# Patient Record
Sex: Female | Born: 1950 | Race: White | Hispanic: No | Smoking: Former smoker
Health system: Southern US, Community
[De-identification: ages and names within clinical notes are randomized; demographics above are authoritative.]

## PROBLEM LIST (undated history)

## (undated) DIAGNOSIS — E669 Obesity, unspecified: Secondary | ICD-10-CM

## (undated) DIAGNOSIS — K219 Gastro-esophageal reflux disease without esophagitis: Secondary | ICD-10-CM

## (undated) DIAGNOSIS — M797 Fibromyalgia: Secondary | ICD-10-CM

## (undated) DIAGNOSIS — L309 Dermatitis, unspecified: Secondary | ICD-10-CM

## (undated) DIAGNOSIS — I219 Acute myocardial infarction, unspecified: Secondary | ICD-10-CM

## (undated) DIAGNOSIS — I2510 Atherosclerotic heart disease of native coronary artery without angina pectoris: Secondary | ICD-10-CM

## (undated) DIAGNOSIS — F419 Anxiety disorder, unspecified: Secondary | ICD-10-CM

## (undated) DIAGNOSIS — Z8719 Personal history of other diseases of the digestive system: Secondary | ICD-10-CM

## (undated) DIAGNOSIS — K5790 Diverticulosis of intestine, part unspecified, without perforation or abscess without bleeding: Secondary | ICD-10-CM

## (undated) DIAGNOSIS — I201 Angina pectoris with documented spasm: Secondary | ICD-10-CM

## (undated) DIAGNOSIS — I10 Essential (primary) hypertension: Secondary | ICD-10-CM

## (undated) DIAGNOSIS — E7800 Pure hypercholesterolemia, unspecified: Secondary | ICD-10-CM

## (undated) DIAGNOSIS — R918 Other nonspecific abnormal finding of lung field: Secondary | ICD-10-CM

## (undated) DIAGNOSIS — K279 Peptic ulcer, site unspecified, unspecified as acute or chronic, without hemorrhage or perforation: Secondary | ICD-10-CM

## (undated) DIAGNOSIS — M1990 Unspecified osteoarthritis, unspecified site: Secondary | ICD-10-CM

## (undated) DIAGNOSIS — G8929 Other chronic pain: Secondary | ICD-10-CM

## (undated) DIAGNOSIS — M549 Dorsalgia, unspecified: Secondary | ICD-10-CM

## (undated) DIAGNOSIS — K2950 Unspecified chronic gastritis without bleeding: Secondary | ICD-10-CM

## (undated) HISTORY — DX: Angina pectoris with documented spasm: I20.1

## (undated) HISTORY — PX: INGUINAL HERNIA REPAIR: SUR1180

## (undated) HISTORY — DX: Essential (primary) hypertension: I10

## (undated) HISTORY — DX: Diverticulosis of intestine, part unspecified, without perforation or abscess without bleeding: K57.90

## (undated) HISTORY — DX: Dermatitis, unspecified: L30.9

## (undated) HISTORY — DX: Obesity, unspecified: E66.9

## (undated) HISTORY — DX: Other nonspecific abnormal finding of lung field: R91.8

## (undated) HISTORY — DX: Unspecified chronic gastritis without bleeding: K29.50

## (undated) HISTORY — PX: LAPAROSCOPIC CHOLECYSTECTOMY: SUR755

## (undated) HISTORY — DX: Peptic ulcer, site unspecified, unspecified as acute or chronic, without hemorrhage or perforation: K27.9

## (undated) HISTORY — PX: CYSTECTOMY: SUR359

## (undated) HISTORY — DX: Atherosclerotic heart disease of native coronary artery without angina pectoris: I25.10

---

## 1977-10-08 HISTORY — PX: TUBAL LIGATION: SHX77

## 1988-10-08 HISTORY — PX: VAGINAL HYSTERECTOMY: SUR661

## 2005-10-23 ENCOUNTER — Ambulatory Visit: Payer: Self-pay | Admitting: Internal Medicine

## 2005-10-26 ENCOUNTER — Encounter: Payer: Self-pay | Admitting: Internal Medicine

## 2005-10-26 ENCOUNTER — Ambulatory Visit (HOSPITAL_COMMUNITY): Admission: RE | Admit: 2005-10-26 | Discharge: 2005-10-26 | Payer: Self-pay | Admitting: Internal Medicine

## 2005-10-26 ENCOUNTER — Ambulatory Visit: Payer: Self-pay | Admitting: Internal Medicine

## 2007-04-03 ENCOUNTER — Ambulatory Visit: Payer: Self-pay | Admitting: Cardiology

## 2007-04-14 ENCOUNTER — Ambulatory Visit: Payer: Self-pay | Admitting: Cardiology

## 2009-05-04 ENCOUNTER — Ambulatory Visit: Payer: Self-pay | Admitting: Cardiology

## 2010-06-27 ENCOUNTER — Ambulatory Visit: Payer: Self-pay | Admitting: Cardiology

## 2010-08-11 ENCOUNTER — Ambulatory Visit (HOSPITAL_COMMUNITY): Admission: RE | Admit: 2010-08-11 | Discharge: 2010-08-11 | Payer: Self-pay | Admitting: Cardiology

## 2010-08-11 ENCOUNTER — Ambulatory Visit: Payer: Self-pay | Admitting: Cardiology

## 2010-08-11 ENCOUNTER — Encounter: Payer: Self-pay | Admitting: Cardiology

## 2010-08-11 ENCOUNTER — Ambulatory Visit: Payer: Self-pay

## 2010-08-14 ENCOUNTER — Encounter: Payer: Self-pay | Admitting: Cardiology

## 2011-02-23 NOTE — Op Note (Signed)
Jenna Hernandez, Jenna Hernandez              ACCOUNT NO.:  [id number]   MEDICAL RECORD NO.:  [id number]          PATIENT TYPE:  AMB   LOCATION:  DAY                           FACILITY:  [facility]   PHYSICIAN:  R. Roetta Sessions, M.D. DATE OF BIRTH:  Feb 25, 1951   DATE OF PROCEDURE:  10/26/2005  DATE OF DISCHARGE:                                 OPERATIVE REPORT   PROCEDURE:  Esophagogastroduodenoscopy with esophageal biopsy.   INDICATIONS FOR PROCEDURE:  The patient is a 60 year old lady with a three-  month history of odynophagia. Jenna Hernandez describes her discomfort as  swallowing a piece of ice. Jenna Hernandez feels things rub behind her breastbone. Jenna Hernandez  has had some background symptoms in the past, typically ingestion and  reflux. These symptoms have been pretty well squelched with Nexium 40 mg  orally b.i.d. which was started by Dr. Neita Carp approximately three months  ago. However, this has not helped the above mentioned symptoms. Jenna Hernandez may have  odynophagia symptoms without swallowing, but most of the time, it is with  swallowing, not worse with pills or food. Has not had any nausea or  vomiting. No GI bleeding.   Jenna Hernandez is not taking any NSAIDs aside from one baby aspirin daily.   Jenna Hernandez is taking Valtrex, not on our original medication list. EGD is now being  done. This approach has been discussed with the patient at length. Potential  risks, benefits, and alternatives have been reviewed and questions answered.  Jenna Hernandez is agreeable. Please see documentation in the medical record.   PROCEDURE NOTE:  O2 saturation, blood pressure, pulse, and respirations were  monitored throughout the entire procedure. Conscious sedation with Versed 5  mg IV, fentanyl 125 mcg IV. Cetacaine spray for topical oropharyngeal  anesthesia.   INSTRUMENT:  Olympus video chip system.   FINDINGS:  Examination of the hypopharynx revealed what appeared to be some  edema around the vocal cords. No obvious polyp or other lesion seen. The  cricopharyngeus was easily intubated. 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 There was also one yellowish flat lesion in the  proximal esophagus, but certainly there was no evidence of candida  esophagus, erosion or ulceration. There was no Barrett's esophagus. No sign  of neoplasia. EG junction appeared normal and was easily traversed. The EG  junction was identified at 36 cm from the incisors with the stomach inflated  and 37 cm deflated.   Stomach:  Gastric cavity insufflated well with air. It was empty. Thorough  examination of gastric mucosa including retroflexed view of the proximal  stomach and esophagogastric junction demonstrated no abnormalities. Pylorus  patent and easily traversed. Examination of bulb and second portion revealed  no abnormalities.   THERAPEUTIC/DIAGNOSTIC MANEUVERS:  The random mucosal biopsies of the mid  distal esophagus were taken to rule out microscopic esophagus/eosinophilic  esophagus. One of the pale nodules in the distal esophagus was biopsied  separately, and finally, a third separate biopsy was taken for pathology,  specifically the small yellowish 4 to 5 mm lesion  in the more proximal  esophagus. The patient tolerated the procedure well and was reactive to  endoscopy.   IMPRESSION:  1.  Nonspecific nodules in the esophagus, may or may not be clinically      significant (lesion consistent with squamous papilloma would not be      significant). No obvious esophagitis. Status post biopsies as described      above.  2.  Normal stomach. Normal D1 and D2.   I do not feel at this time that pH monitoring would add anything at all to  the clinical scenario as Jenna Hernandez does have some typical reflux symptoms that  have been pretty well squelched with b.i.d. PPI therapy. Jenna Hernandez does have some  subtle edema and swelling around her vocal cords which may or may not  be  significant.   RECOMMENDATIONS:  1.  Continue Nexium 40 mg orally b.i.d.  2.  Add Carafate 1 g slurries q.i.d. p.r.n. chest discomfort.  3.  Follow up on biopsies.  4.  Further recommendations to follow.      Jonathon Bellows, M.D.  Electronically Signed     RMR/MEDQ  D:  10/26/2005  T:  10/26/2005  Job:  [id number]   cc:   Fara Chute  Fax: [phone]

## 2011-02-23 NOTE — Consult Note (Signed)
Jenna Hernandez, Jenna Hernandez              ACCOUNT NO.:  [id number]   MEDICAL RECORD NO.:  [id number]          PATIENT TYPE:  AMB   LOCATION:                                FACILITY:  [facility]   PHYSICIAN:  Jenna Hernandez, M.D.    DATE OF BIRTH:  [birth year]   DATE OF CONSULTATION:  DATE OF DISCHARGE:                                   CONSULTATION   REASON FOR CONSULTATION:  Odynophagia.   HISTORY OF PRESENT ILLNESS:  Jenna Hernandez is a 60 year old Caucasian female  who notes over the last three months she has had intermittent odynophagia.  She states it feels as if a piece of ice is running along her esophagus  when she swallows.  She has problems when swallowing both liquids and  solids.  She denies any dysphagia, however.  Most of her food bolus' are  passing freely.  She denies any regurgitation, nausea or vomiting.  She was  placed on Nexium 40 mg b.i.d. within the last couple of months.  She notices  slight improvement of her symptoms, but not completely.  She denies any  heartburn or indigestion.  The pain is not necessarily associated with  eating, nor does food make it worse.  She rates the pain 9/10 on pain scale.  Mylanta does seem to ease the pain somewhat.  She was treated with Septra  for UTI about two weeks ago.  However, this was after the onset of her  symptoms.   She has a history of IBS, currently on Zelnorm for constipation predominant  symptoms.  She generally has between 1-3 bowel movements a day while on this  medication.  She denies any rectal bleeding or melena.   PAST MEDICAL HISTORY:  1.  Fibromyalgia.  2.  Irritable bowel syndrome.  3.  She had a colonoscopy in January 2006 which was normal but Dr.      Cleotis Nipper.  4.  EGD in 2005 which reportedly was normal by Dr. Cleotis Nipper, per the      patient.  5.  Cholecystectomy in 2003.  6.  Hysterectomy in 1990.  7.  Femoral hernia repair in 1982.  8.  Previously she had an EGD on September 01, 2002, by Dr. Karilyn Cota which  was      normal.  She had a CLO test and I do not have results of this.   CURRENT MEDICATIONS:  1.  Aspirin 81 mg daily.  2.  Nexium 40 mg b.i.d.  3.  Zelnorm 6 mg b.i.d.  4.  Xanax 0.25 mg q.h.s.   ALLERGIES:  CODEINE AND IV MEPERGAN.   FAMILY HISTORY:  Positive for her mother who was diagnosed with colon cancer  in her 47's.  She also has history of coronary artery disease and  osteoarthritis.  She is alive and doing well status post colon surgery.  Father, age 51, has history of MI and diabetes mellitus.  She has one sister  with diabetes mellitus.  Two healthy brothers.   SOCIAL HISTORY:  [name] is divorced.  She lives with her parents.  She  has  three grown healthy children.  She is employed full time with [employer].  She has a 10-year history of smoking about half pack a day.  Denies any alcohol or drug use.   REVIEW OF SYSTEMS:  CONSTITUTIONAL:  She is decreasing her caloric intake,  trying to loose weight.  She has lost about 10 pounds a year over the last  three years.  Denies any fever or chills.  Denies any anorexia or early  satiety.  CARDIOVASCULAR:  She did have some atypical type chest pain a  couple of months ago.  Was seen in [facility] by Dr. Reyes Ivan.  She had a  cardiac workup which sounds like it consisted of a stress Myoview and  echocardiogram, both of which Jenna Hernandez tells me were negative.  PULMONARY:  Denies any cough, shortness of breath, dyspnea or hemoptysis.  GI:  See HPI.   PHYSICAL EXAMINATION:  VITAL SIGNS:  Weight 150 pounds, height 63-1/2  inches, temperature 97.9, blood pressure 120/74, pulse 68.  GENERAL APPEARANCE:  Jenna Hernandez is 60 year old Caucasian female with a  three month history of severe intermittent odynophagia along with mild  epigastric pain.  She denies any heartburn or indigestion or regurgitation.  She denies any dysphagia either.  I suspect she may have erosive reflux  esophagitis or even candida esophagitis  given her symptoms.  She is going to  need further evaluation.  She has history of chronic GERD symptoms and  dyspepsia.  She also has history of consultation predominant IBS.   PLAN:  1.  Will schedule EGD plus or minus BRAVO with Dr. Karilyn Cota in the near      future.  2.  She can continue Nexium 40 mg b.i.d. #60 with one refill.  3.  Further recommendations pending study.   I would like to thank Dr. Neita Carp for allowing [place] to participate in the care  of Jenna Hernandez.      [name], N.P.      Jenna Hernandez, M.D.  Electronically Signed    KC/MEDQ  D:  10/23/2005  T:  10/23/2005  Job:  [id number]   cc:   Fara Chute  Fax: [phone]

## 2011-12-07 HISTORY — PX: CARDIAC CATHETERIZATION: SHX172

## 2011-12-13 ENCOUNTER — Encounter: Payer: Self-pay | Admitting: Internal Medicine

## 2011-12-13 ENCOUNTER — Other Ambulatory Visit: Payer: Self-pay

## 2011-12-13 ENCOUNTER — Encounter (HOSPITAL_COMMUNITY): Payer: Self-pay | Admitting: Internal Medicine

## 2011-12-13 ENCOUNTER — Inpatient Hospital Stay (HOSPITAL_COMMUNITY)
Admission: AD | Admit: 2011-12-13 | Discharge: 2011-12-14 | DRG: 282 | Disposition: A | Discharge status: Home / Self Care | Payer: PRIVATE HEALTH INSURANCE | Source: Other Acute Inpatient Hospital | Attending: Internal Medicine | Admitting: Internal Medicine

## 2011-12-13 ENCOUNTER — Encounter (HOSPITAL_COMMUNITY)
Admission: AD | Disposition: A | Discharge status: Home / Self Care | Payer: Self-pay | Source: Other Acute Inpatient Hospital | Attending: Internal Medicine

## 2011-12-13 DIAGNOSIS — K219 Gastro-esophageal reflux disease without esophagitis: Secondary | ICD-10-CM | POA: Diagnosis present

## 2011-12-13 DIAGNOSIS — I214 Non-ST elevation (NSTEMI) myocardial infarction: Principal | ICD-10-CM | POA: Diagnosis present

## 2011-12-13 DIAGNOSIS — F17200 Nicotine dependence, unspecified, uncomplicated: Secondary | ICD-10-CM | POA: Diagnosis present

## 2011-12-13 DIAGNOSIS — R079 Chest pain, unspecified: Secondary | ICD-10-CM

## 2011-12-13 DIAGNOSIS — IMO0001 Reserved for inherently not codable concepts without codable children: Secondary | ICD-10-CM | POA: Diagnosis present

## 2011-12-13 HISTORY — PX: LEFT HEART CATHETERIZATION WITH CORONARY ANGIOGRAM: SHX5451

## 2011-12-13 HISTORY — DX: Fibromyalgia: M79.7

## 2011-12-13 HISTORY — DX: Gastro-esophageal reflux disease without esophagitis: K21.9

## 2011-12-13 LAB — PROTIME-INR: Prothrombin Time: 13.7 seconds (ref 11.6–15.2)

## 2011-12-13 LAB — DIFFERENTIAL
Eosinophils Absolute: 0.1 10*3/uL (ref 0.0–0.7)
Eosinophils Relative: 1 % (ref 0–5)
Lymphocytes Relative: 29 % (ref 12–46)
Lymphs Abs: 2 10*3/uL (ref 0.7–4.0)
Monocytes Absolute: 0.6 10*3/uL (ref 0.1–1.0)
Monocytes Relative: 9 % (ref 3–12)

## 2011-12-13 LAB — LIPID PANEL
LDL Cholesterol: 74 mg/dL (ref 0–99)
Triglycerides: 116 mg/dL (ref <150)

## 2011-12-13 LAB — CARDIAC PANEL(CRET KIN+CKTOT+MB+TROPI)
CK, MB: 36.7 ng/mL (ref 0.3–4.0)
CK, MB: 26.5 ng/mL (ref 0.3–4.0)
CK, MB: 20.3 ng/mL (ref 0.3–4.0)
Relative Index: 8 — ABNORMAL HIGH (ref 0.0–2.5)
Total CK: 349 U/L — ABNORMAL HIGH (ref 7–177)
Total CK: 333 U/L — ABNORMAL HIGH (ref 7–177)
Total CK: 317 U/L — ABNORMAL HIGH (ref 7–177)

## 2011-12-13 LAB — CBC
HCT: 36.6 % (ref 36.0–46.0)
MCH: 30.7 pg (ref 26.0–34.0)
MCV: 91.3 fL (ref 78.0–100.0)
RBC: 4.01 MIL/uL (ref 3.87–5.11)
WBC: 6.9 10*3/uL (ref 4.0–10.5)

## 2011-12-13 LAB — COMPREHENSIVE METABOLIC PANEL
ALT: 13 U/L (ref 0–35)
BUN: 13 mg/dL (ref 6–23)
CO2: 32 mEq/L (ref 19–32)
Calcium: 9.7 mg/dL (ref 8.4–10.5)
Creatinine, Ser: 0.82 mg/dL (ref 0.50–1.10)
GFR calc Af Amer: 88 mL/min — ABNORMAL LOW (ref >90)
GFR calc non Af Amer: 76 mL/min — ABNORMAL LOW (ref >90)
Glucose, Bld: 96 mg/dL (ref 70–99)
Total Protein: 6.5 g/dL (ref 6.0–8.3)

## 2011-12-13 LAB — TSH: TSH: 3.714 u[IU]/mL (ref 0.350–4.500)

## 2011-12-13 LAB — POCT ACTIVATED CLOTTING TIME: Activated Clotting Time: 177 seconds

## 2011-12-13 SURGERY — LEFT HEART CATHETERIZATION WITH CORONARY ANGIOGRAM
Anesthesia: LOCAL

## 2011-12-13 MED ORDER — METOPROLOL TARTRATE 12.5 MG HALF TABLET
12.5000 mg | ORAL_TABLET | Freq: Two times a day (BID) | ORAL | Status: DC
  Administered 2011-12-13 – 2011-12-14 (×2): 12.5 mg via ORAL
  Filled 2011-12-13 (×6): qty 1

## 2011-12-13 MED ORDER — ASPIRIN EC 81 MG PO TBEC
81.0000 mg | DELAYED_RELEASE_TABLET | Freq: Every day | ORAL | Status: DC
  Administered 2011-12-14: 81 mg via ORAL
  Filled 2011-12-13: qty 1

## 2011-12-13 MED ORDER — NITROGLYCERIN 0.2 MG/ML ON CALL CATH LAB
INTRAVENOUS | Status: AC
  Filled 2011-12-13: qty 1

## 2011-12-13 MED ORDER — CLOPIDOGREL BISULFATE 75 MG PO TABS
75.0000 mg | ORAL_TABLET | Freq: Every day | ORAL | Status: DC
  Administered 2011-12-14: 75 mg via ORAL
  Filled 2011-12-13 (×2): qty 1

## 2011-12-13 MED ORDER — DIAZEPAM 5 MG PO TABS
5.0000 mg | ORAL_TABLET | ORAL | Status: AC
  Administered 2011-12-13: 5 mg via ORAL

## 2011-12-13 MED ORDER — METOPROLOL TARTRATE 25 MG PO TABS: 25.0000 mg | ORAL_TABLET | Freq: Two times a day (BID) | ORAL | Status: DC

## 2011-12-13 MED ORDER — SODIUM CHLORIDE 0.9 % IV SOLN
INTRAVENOUS | Status: AC
  Administered 2011-12-13: 13:00:00 via INTRAVENOUS

## 2011-12-13 MED ORDER — ACETAMINOPHEN 325 MG PO TABS: 650.0000 mg | ORAL_TABLET | ORAL | Status: DC | PRN

## 2011-12-13 MED ORDER — FAMOTIDINE 20 MG PO TABS
20.0000 mg | ORAL_TABLET | Freq: Two times a day (BID) | ORAL | Status: DC
  Administered 2011-12-13 – 2011-12-14 (×2): 20 mg via ORAL
  Filled 2011-12-13 (×6): qty 1

## 2011-12-13 MED ORDER — PANTOPRAZOLE SODIUM 40 MG PO TBEC: 80.0000 mg | DELAYED_RELEASE_TABLET | Freq: Every day | ORAL | Status: DC

## 2011-12-13 MED ORDER — SODIUM CHLORIDE 0.9 % IJ SOLN: 3.0000 mL | Freq: Two times a day (BID) | INTRAMUSCULAR | Status: DC

## 2011-12-13 MED ORDER — DIAZEPAM 5 MG PO TABS
ORAL_TABLET | ORAL | Status: AC
  Administered 2011-12-13: 5 mg via ORAL
  Filled 2011-12-13: qty 1

## 2011-12-13 MED ORDER — LIDOCAINE HCL (PF) 1 % IJ SOLN
INTRAMUSCULAR | Status: AC
  Filled 2011-12-13: qty 30

## 2011-12-13 MED ORDER — ALUM & MAG HYDROXIDE-SIMETH 200-200-20 MG/5ML PO SUSP
30.0000 mL | Freq: Four times a day (QID) | ORAL | Status: DC | PRN
  Administered 2011-12-13: 30 mL via ORAL
  Filled 2011-12-13: qty 30

## 2011-12-13 MED ORDER — SODIUM CHLORIDE 0.9 % IV SOLN: 1.0000 mL/kg/h | INTRAVENOUS | Status: DC

## 2011-12-13 MED ORDER — FENTANYL CITRATE 0.05 MG/ML IJ SOLN
INTRAMUSCULAR | Status: AC
  Filled 2011-12-13: qty 2

## 2011-12-13 MED ORDER — PANTOPRAZOLE SODIUM 40 MG PO TBEC
40.0000 mg | DELAYED_RELEASE_TABLET | Freq: Every day | ORAL | Status: DC
Start: 2011-12-13 — End: 2011-12-14
  Administered 2011-12-14: 40 mg via ORAL
  Filled 2011-12-13: qty 1

## 2011-12-13 MED ORDER — NITROGLYCERIN 0.4 MG SL SUBL
0.4000 mg | SUBLINGUAL_TABLET | SUBLINGUAL | Status: DC | PRN
  Administered 2011-12-13 (×3): 0.4 mg via SUBLINGUAL
  Filled 2011-12-13: qty 75

## 2011-12-13 MED ORDER — SODIUM CHLORIDE 0.9 % IJ SOLN: 3.0000 mL | INTRAMUSCULAR | Status: DC | PRN

## 2011-12-13 MED ORDER — ASPIRIN 300 MG RE SUPP
300.0000 mg | RECTAL | Status: AC
  Filled 2011-12-13: qty 1

## 2011-12-13 MED ORDER — HEPARIN (PORCINE) IN NACL 100-0.45 UNIT/ML-% IJ SOLN
1000.0000 [IU]/h | INTRAMUSCULAR | Status: DC
  Administered 2011-12-13: 1050 [IU]/h via INTRAVENOUS
  Administered 2011-12-14: 1000 [IU]/h via INTRAVENOUS
  Filled 2011-12-13 (×2): qty 250

## 2011-12-13 MED ORDER — MIDAZOLAM HCL 2 MG/2ML IJ SOLN
INTRAMUSCULAR | Status: AC
  Filled 2011-12-13: qty 2

## 2011-12-13 MED ORDER — HEPARIN BOLUS VIA INFUSION
2000.0000 [IU] | Freq: Once | INTRAVENOUS | Status: AC
  Administered 2011-12-13: 2000 [IU] via INTRAVENOUS
  Filled 2011-12-13: qty 2000

## 2011-12-13 MED ORDER — ATORVASTATIN CALCIUM 80 MG PO TABS
80.0000 mg | ORAL_TABLET | Freq: Every day | ORAL | Status: DC
  Administered 2011-12-13 (×2): 80 mg via ORAL
  Filled 2011-12-13 (×5): qty 1

## 2011-12-13 MED ORDER — NITROGLYCERIN IN D5W 200-5 MCG/ML-% IV SOLN
INTRAVENOUS | Status: AC
  Administered 2011-12-13: 10 ug/min via INTRAVENOUS
  Filled 2011-12-13: qty 250

## 2011-12-13 MED ORDER — SODIUM CHLORIDE 0.9 % IV SOLN
INTRAVENOUS | Status: DC
  Administered 2011-12-13: 06:00:00 via INTRAVENOUS

## 2011-12-13 MED ORDER — ASPIRIN 81 MG PO CHEW
324.0000 mg | CHEWABLE_TABLET | ORAL | Status: AC
  Administered 2011-12-13: 324 mg via ORAL
  Filled 2011-12-13: qty 4

## 2011-12-13 MED ORDER — HEPARIN (PORCINE) IN NACL 100-0.45 UNIT/ML-% IJ SOLN
1050.0000 [IU]/h | INTRAMUSCULAR | Status: DC
  Filled 2011-12-13 (×2): qty 250

## 2011-12-13 MED ORDER — ASPIRIN 81 MG PO CHEW: 324.0000 mg | CHEWABLE_TABLET | ORAL | Status: DC

## 2011-12-13 MED ORDER — HEPARIN (PORCINE) IN NACL 100-0.45 UNIT/ML-% IJ SOLN
800.0000 [IU]/h | INTRAMUSCULAR | Status: DC
  Administered 2011-12-13: 800 [IU]/h via INTRAVENOUS
  Filled 2011-12-13: qty 250

## 2011-12-13 MED ORDER — SODIUM CHLORIDE 0.9 % IV SOLN: 250.0000 mL | INTRAVENOUS | Status: DC | PRN

## 2011-12-13 MED ORDER — ONDANSETRON HCL 4 MG/2ML IJ SOLN: 4.0000 mg | Freq: Four times a day (QID) | INTRAMUSCULAR | Status: DC | PRN

## 2011-12-13 MED ORDER — NITROGLYCERIN IN D5W 200-5 MCG/ML-% IV SOLN
2.0000 ug/min | INTRAVENOUS | Status: DC
  Administered 2011-12-13: 10 ug/min via INTRAVENOUS

## 2011-12-13 MED ORDER — ALPRAZOLAM 0.25 MG PO TABS: 0.2500 mg | ORAL_TABLET | Freq: Every evening | ORAL | Status: DC | PRN

## 2011-12-13 MED ORDER — HEPARIN (PORCINE) IN NACL 2-0.9 UNIT/ML-% IJ SOLN
INTRAMUSCULAR | Status: AC
  Filled 2011-12-13: qty 2000

## 2011-12-13 MED ORDER — CLOPIDOGREL BISULFATE 300 MG PO TABS
600.0000 mg | ORAL_TABLET | Freq: Once | ORAL | Status: AC
  Administered 2011-12-13: 600 mg via ORAL
  Filled 2011-12-13: qty 2

## 2011-12-13 NOTE — Op Note (Addendum)
    Cardiac Cath Note  Jenna Hernandez [id number] Jan 18, 1951  Procedure: left  Heart Cardiac Catheterization Note Indications: chest pain, abnormal cardiac enzymes  Procedure Details Consent: Obtained Time Out: Verified patient identification, verified procedure, site/side was marked, verified correct patient position, special equipment/implants available, Radiology Safety Procedures followed,  medications/allergies/relevent history reviewed, required imaging and test results available.  Performed   Medications: Fentanyl: 50 mcg IV Versed: 2 mg IV  The right femoral artery was easily canulated using a modified Seldinger technique.  Hemodynamics:   LV pressure: 103/5  Aortic pressure: 96/60  Angiography   Left Main: The left main is smooth and normal.  Left anterior Descending: Left anterior descending artery is smooth and normal. The LAD gives off a moderate to large diagonal branch which is tortuous but otherwise normal. The distal LAD has some tortuosity.  Ramus intermediate vessel: The ramus intermediate vessel is large and normal.  Left Circumflex: The left circumflex artery is a moderate-sized vessel. It gives off a very small first acute marginal artery and a large second obtuse marginal artery. Both of these branches are normal.  Right Coronary Artery: The right coronary artery is large and dominant. It is smooth and normal throughout its course. There is a pseudo-stenosis at the proximal portion of the RCA that is caused by the engagement of the judkins R4 catheter.  The posterior descending artery and posterior lateral segment artery are normal.  LV Gram: Left ventriculogram was performed in the 30 RAO position. It reveals normal left ventricular systolic function. Ejection fraction is around 65. There is a small area of moderate - severe hypokinesis in the  mid inferior wall.   Complications: No apparent complications Patient did tolerate procedure  well.  Conclusions:   1. Smooth and normal coronary arteries 2. Normal left ventricular systolic function but with a small area of severe hypokinesis in the mid inferior wall.  This may have been due to transient thrombosis with resolution of the thrombus.  I doubt she had proximal RCA spasm or thrombosis given the small size of the wall motion defect.    Rec. Heparin for 24 hours.  ASA + plavix.  Home in 1-2 days. Would recommend standard post MI meds - Beta blocker, ace inhibitor.  Vesta Mixer, Montez Hageman., MD, [facility] 12/13/2011, 12:09 PM

## 2011-12-13 NOTE — H&P (View-Only) (Signed)
 Subjective:  The patient has had a NSTEMI. Most recent troponin 7.70. EKG shows no acute changes.  Still has a twinge of chest pain.  Objective:  Vital Signs in the last 24 hours: Temp:  [98.8 F (37.1 C)] 98.8 F (37.1 C) (03/07 0300) Pulse Rate:  [57-73] 73  (03/07 0600) Resp:  [11-21] 21  (03/07 0600) BP: (103-117)/(47-66) 103/52 mmHg (03/07 0600) SpO2:  [94 %-98 %] 97 % (03/07 0600) Weight:  [144 lb 2.9 oz (65.4 kg)] 144 lb 2.9 oz (65.4 kg) (03/07 0330)  Intake/Output from previous day: 03/06 0701 - 03/07 0700 In: 432 [I.V.:432] Out: -  Intake/Output from this shift: Total I/O In: -  Out: 300 [Urine:300]     . aspirin  324 mg Oral NOW   Or  . aspirin  300 mg Rectal NOW  . aspirin EC  81 mg Oral Daily  . atorvastatin  80 mg Oral q1800  . clopidogrel  600 mg Oral Once  . clopidogrel  75 mg Oral Q breakfast  . famotidine  20 mg Oral BID  . heparin  2,000 Units Intravenous Once  . metoprolol tartrate  12.5 mg Oral BID  . pantoprazole  40 mg Oral Q1200  . DISCONTD: metoprolol tartrate  25 mg Oral BID  . DISCONTD: pantoprazole  80 mg Oral Q1200      . sodium chloride 100 mL/hr at 12/13/11 0600  . heparin    . DISCONTD: heparin 800 Units/hr (12/13/11 0600)    Physical Exam: The patient appears to be in no distress.  Head and neck exam reveals that the pupils are equal and reactive.  The extraocular movements are full.  There is no scleral icterus.  Mouth and pharynx are benign.  No lymphadenopathy.  No carotid bruits.  The jugular venous pressure is normal.  Thyroid is not enlarged or tender.  Chest is clear to percussion and auscultation.  No rales or rhonchi.  Expansion of the chest is symmetrical.  Heart reveals no abnormal lift or heave.  First and second heart sounds are normal.  There is no murmur gallop rub or click.  The abdomen is soft and nontender.  Bowel sounds are normoactive.  There is no hepatosplenomegaly or mass.  There are no abdominal  bruits.  Extremities reveal no phlebitis or edema.  Pedal pulses are good.  There is no cyanosis or clubbing.  Neurologic exam is normal strength and no lateralizing weakness.  No sensory deficits.  Integument reveals no rash  Lab Results:  Basename 12/13/11 0630  WBC 6.9  HGB 12.3  PLT 241    Basename 12/13/11 0630  NA 143  K 3.8  CL 105  CO2 32  GLUCOSE 96  BUN 13  CREATININE 0.82    Basename 12/13/11 0630  TROPONINI 7.70*   Hepatic Function Panel  Basename 12/13/11 0630  PROT 6.5  ALBUMIN 3.6  AST 36  ALT 13  ALKPHOS 58  BILITOT 0.3  BILIDIR --  IBILI --    Basename 12/13/11 0630  CHOL 159   No results found for this basename: PROTIME in the last 72 hours  Imaging: Imaging results have been reviewed.  Chest xray done at [facility] normal.  Cardiac Studies:  Assessment/Plan:  Patient Active Hospital Problem List: NSTEMI (non-ST elevated myocardial infarction) (12/13/2011)   Assessment: Enzymes rising   Plan: Left heart cath/PCI this am The risks and benefits of a cardiac catheterization including, but not limited to, death, stroke, MI, kidney damage   and bleeding were discussed with the patient who indicates understanding and agrees to proceed.    LOS: 0 days    Ellar Hakala 12/13/2011, 8:28 AM    

## 2011-12-13 NOTE — Progress Notes (Signed)
ANTICOAGULATION CONSULT NOTE - Initial Consult  Pharmacy Consult for heparin Indication: NSTEMI  Allergies  Allergen Reactions  . Voltaren Anaphylaxis    Tolerates aspirin  . Mepergan     headache  . Sulfa Antibiotics   . Codeine Rash    Patient Measurements: Height: 5\' 3"  (160 cm) Weight: 144 lb 2.9 oz (65.4 kg) IBW/kg (Calculated) : 52.4   Vital Signs: Temp: 98.8 F (37.1 C) (03/07 0300) Temp src: Oral (03/07 0300) BP: 117/66 mmHg (03/07 0400) Pulse Rate: 70  (03/07 0400)  Medical History: Past Medical History  Diagnosis Date  . GERD (gastroesophageal reflux disease)   . Fibromyalgia    Assessment: 62yo female c/o worsening GERD Sx, seen at PCP for epigastric pain radiating to jaw and chest, found with elevated troponin and sent to [facility] ED, now transferred to [facility] for NSTEMI management, to continue heparin begun at [facility].  (Labs from [place] WNL except CE.)  Goal of Therapy:  Heparin level 0.3-0.7 units/ml   Plan:  Rec'd heparin bolus of 4000 units followed by gtt at 800 units/hr begun @0100  at OSH; will continue for now and monitor heparin levels and CBC.  Colleen Can PharmD BCPS 12/13/2011,4:48 AM

## 2011-12-13 NOTE — Interval H&P Note (Signed)
History and Physical Interval Note:  12/13/2011 11:44 AM  Jenna Hernandez  has presented today for surgery, with the diagnosis of non stemi  The various methods of treatment have been discussed with the patient and family. After consideration of risks, benefits and other options for treatment, the patient has consented to  Procedure(s) (LRB): LEFT HEART CATHETERIZATION WITH CORONARY ANGIOGRAM (N/A) as a surgical intervention .  The patients' history has been reviewed, patient examined, no change in status, stable for surgery.  I have reviewed the patients' chart and labs.  Questions were answered to the patient's satisfaction.     Elyn Aquas.

## 2011-12-13 NOTE — Progress Notes (Signed)
ANTICOAGULATION CONSULT NOTE - Follow Up Consult  Pharmacy Consult for Heparin Indication: NSTEMI  Allergies  Allergen Reactions  . Voltaren Anaphylaxis    Tolerates aspirin  . Mepergan     headache  . Sulfa Antibiotics   . Codeine Rash    Patient Measurements: Height: 5\' 3"  (160 cm) Weight: 144 lb 2.9 oz (65.4 kg) IBW/kg (Calculated) : 52.4   Vital Signs: Temp: 98.8 F (37.1 C) (03/07 0300) Temp src: Oral (03/07 0300) BP: 98/60 mmHg (03/07 1316) Pulse Rate: 59  (03/07 1316)  Labs:  Basename 12/13/11 0630  HGB 12.3  HCT 36.6  PLT 241  APTT 45*  LABPROT 13.7  INR 1.03  HEPARINUNFRC 0.16*  CREATININE 0.82  CKTOTAL 349*  CKMB 36.7*  TROPONINI 7.70*   Estimated Creatinine Clearance: 65.5 ml/min (by C-G formula based on Cr of 0.82).   Assessment: 61 yo F s/p cath with normal left ventricular systolic function but with a small area of severe hypokinesis in the mid inferior wall which could have been due to transient thrombosis with resolution of the thrombus.  Plan to restart Heparin 6 hours post sheath removal.  Was on heparin prior to cath, level was subtherapeutic this AM.  Goal of Therapy:  Heparin level 0.3-0.7 units/ml   Plan:  1.  Restart heparin at 1050 units/hr at 1830 (6 hrs post sheath removal) 2.  F/U 6 hour heparin level 3.  Daily CBC, heparin level   Rolland Porter, Pharm.D., BCPS Clinical Pharmacist Pager: [phone]

## 2011-12-13 NOTE — Progress Notes (Signed)
Subjective:  The patient has had a NSTEMI. Most recent troponin 7.70. EKG shows no acute changes.  Still has a twinge of chest pain.  Objective:  Vital Signs in the last 24 hours: Temp:  [98.8 F (37.1 C)] 98.8 F (37.1 C) (03/07 0300) Pulse Rate:  [57-73] 73  (03/07 0600) Resp:  [11-21] 21  (03/07 0600) BP: (103-117)/(47-66) 103/52 mmHg (03/07 0600) SpO2:  [94 %-98 %] 97 % (03/07 0600) Weight:  [144 lb 2.9 oz (65.4 kg)] 144 lb 2.9 oz (65.4 kg) (03/07 0330)  Intake/Output from previous day: 03/06 0701 - 03/07 0700 In: 432 [I.V.:432] Out: -  Intake/Output from this shift: Total I/O In: -  Out: 300 [Urine:300]     . aspirin  324 mg Oral NOW   Or  . aspirin  300 mg Rectal NOW  . aspirin EC  81 mg Oral Daily  . atorvastatin  80 mg Oral q1800  . clopidogrel  600 mg Oral Once  . clopidogrel  75 mg Oral Q breakfast  . famotidine  20 mg Oral BID  . heparin  2,000 Units Intravenous Once  . metoprolol tartrate  12.5 mg Oral BID  . pantoprazole  40 mg Oral Q1200  . DISCONTD: metoprolol tartrate  25 mg Oral BID  . DISCONTD: pantoprazole  80 mg Oral Q1200      . sodium chloride 100 mL/hr at 12/13/11 0600  . heparin    . DISCONTD: heparin 800 Units/hr (12/13/11 0600)    Physical Exam: The patient appears to be in no distress.  Head and neck exam reveals that the pupils are equal and reactive.  The extraocular movements are full.  There is no scleral icterus.  Mouth and pharynx are benign.  No lymphadenopathy.  No carotid bruits.  The jugular venous pressure is normal.  Thyroid is not enlarged or tender.  Chest is clear to percussion and auscultation.  No rales or rhonchi.  Expansion of the chest is symmetrical.  Heart reveals no abnormal lift or heave.  First and second heart sounds are normal.  There is no murmur gallop rub or click.  The abdomen is soft and nontender.  Bowel sounds are normoactive.  There is no hepatosplenomegaly or mass.  There are no abdominal  bruits.  Extremities reveal no phlebitis or edema.  Pedal pulses are good.  There is no cyanosis or clubbing.  Neurologic exam is normal strength and no lateralizing weakness.  No sensory deficits.  Integument reveals no rash  Lab Results:  Basename 12/13/11 0630  WBC 6.9  HGB 12.3  PLT 241    Basename 12/13/11 0630  NA 143  K 3.8  CL 105  CO2 32  GLUCOSE 96  BUN 13  CREATININE 0.82    Basename 12/13/11 0630  TROPONINI 7.70*   Hepatic Function Panel  Basename 12/13/11 0630  PROT 6.5  ALBUMIN 3.6  AST 36  ALT 13  ALKPHOS 58  BILITOT 0.3  BILIDIR --  IBILI --    Basename 12/13/11 0630  CHOL 159   No results found for this basename: PROTIME in the last 72 hours  Imaging: Imaging results have been reviewed.  Chest xray done at [facility] normal.  Cardiac Studies:  Assessment/Plan:  Patient Active Hospital Problem List: NSTEMI (non-ST elevated myocardial infarction) (12/13/2011)   Assessment: Enzymes rising   Plan: Left heart cath/PCI this am The risks and benefits of a cardiac catheterization including, but not limited to, death, stroke, MI, kidney damage  and bleeding were discussed with the patient who indicates understanding and agrees to proceed.    LOS: 0 days    Cassell Clement 12/13/2011, 8:28 AM

## 2011-12-13 NOTE — Progress Notes (Signed)
ANTICOAGULATION CONSULT NOTE - Follow Up  Pharmacy Consult for heparin Indication: NSTEMI  Allergies  Allergen Reactions  . Voltaren Anaphylaxis    Tolerates aspirin  . Mepergan     headache  . Sulfa Antibiotics   . Codeine Rash    Patient Measurements: Height: 5\' 3"  (160 cm) Weight: 144 lb 2.9 oz (65.4 kg) IBW/kg (Calculated) : 52.4   Vital Signs: Temp: 98.8 F (37.1 C) (03/07 0300) Temp src: Oral (03/07 0300) BP: 103/52 mmHg (03/07 0600) Pulse Rate: 73  (03/07 0600)  Medical History: Past Medical History  Diagnosis Date  . GERD (gastroesophageal reflux disease)   . Fibromyalgia    Assessment: 61 yo female c/o transferred from [facility] ED for NSTEMI management on IV heparin. Heparin level (0.16) is below-goal. No problem with line per RN.   Goal of Therapy:  Heparin level 0.3-0.7 units/ml   Plan:  1. Heparin IV bolus of 2000 units x 1, then increase IV infusion to 1050 units/hr.  2. Heparin level in 6 hours.   Emeline Gins  12/13/2011,7:17 AM

## 2011-12-13 NOTE — H&P (Signed)
Cardiology H&P  Primary Care Povider: No primary provider on file. Primary Cardiologist: none   HPI: Ms. Jenna Hernandez is a 61 y.o.female transferred from [facility] with NSTEMI.  She has a history of GERD and reports worsening symptoms over the last week despite nexium and zantac.  She describes the pain as epigastric and burning and not related to exertion.  However, today her epigastric pain began radiating up into her jaw and chest and was constant.  She called her PCP who saw her in clinic and got an EKG (reported as normal) as well as troponin that came back elevated.  She was then sent to the ED.  Her pain was improved with nitroglycerin and now she is pain free.  She still has some epigastric discomfort but she has no more chest and jaw pain.  Overall she feels much better.     Past Medical History  Diagnosis Date  . GERD (gastroesophageal reflux disease)   . Fibromyalgia     Past Surgical History  Procedure Date  . Cholecystectomy   . Total abdominal hysterectomy   . Inguinal hernia repair     Family History  Problem Relation Age of Onset  . Coronary artery disease Father 67    Social History:  reports that she has been smoking Cigarettes.  She has been smoking about .5 packs per day. She does not have any smokeless tobacco history on file. She reports that she does not drink alcohol. Her drug history not on file.  Allergies:  Allergies  Allergen Reactions  . Voltaren Anaphylaxis    Tolerates aspirin  . Mepergan     headache  . Sulfa Antibiotics   . Codeine Rash    Current Facility-Administered Medications  Medication Dose Route Frequency Provider Last Rate Last Dose  . 0.9 %  sodium chloride infusion   Intravenous Continuous Meridee Score, MD      . acetaminophen (TYLENOL) tablet 650 mg  650 mg Oral Q4H PRN Meridee Score, MD      . ALPRAZolam Prudy Feeler) tablet 0.25 mg  0.25 mg Oral QHS PRN Meridee Score, MD      . aspirin chewable tablet 324 mg  324 mg Oral NOW  Meridee Score, MD       Or  . aspirin suppository 300 mg  300 mg Rectal NOW Meridee Score, MD      . aspirin EC tablet 81 mg  81 mg Oral Daily Meridee Score, MD      . atorvastatin (LIPITOR) tablet 80 mg  80 mg Oral q1800 Meridee Score, MD      . clopidogrel (PLAVIX) tablet 600 mg  600 mg Oral Once Meridee Score, MD      . clopidogrel (PLAVIX) tablet 75 mg  75 mg Oral Q breakfast Meridee Score, MD      . famotidine (PEPCID) tablet 20 mg  20 mg Oral BID Meridee Score, MD      . metoprolol tartrate (LOPRESSOR) tablet 12.5 mg  12.5 mg Oral BID Meridee Score, MD      . nitroGLYCERIN (NITROSTAT) SL tablet 0.4 mg  0.4 mg Sublingual Q5 Min x 3 PRN Meridee Score, MD      . ondansetron [facility]) injection 4 mg  4 mg Intravenous Q6H PRN Meridee Score, MD      . pantoprazole (PROTONIX) EC tablet 40 mg  40 mg Oral Q1200 Meridee Score, MD      . DISCONTD: heparin ADULT infusion 100 units/mL (25000 units/250 mL)  800  Units/hr Intravenous Continuous Colleen Can, [place]      . DISCONTD: metoprolol tartrate (LOPRESSOR) tablet 25 mg  25 mg Oral BID Meridee Score, MD      . DISCONTD: pantoprazole (PROTONIX) EC tablet 80 mg  80 mg Oral Q1200 Meridee Score, MD        ROS: A full review of systems is obtained and is negative except as noted in the HPI.  Physical Exam: Blood pressure 117/66, pulse 70, temperature 98.8 F (37.1 C), temperature source Oral, resp. rate 21, height 5\' 3"  (1.6 m), weight 65.4 kg (144 lb 2.9 oz), SpO2 96.00%.  GENERAL: no acute distress.  EYES: Extra ocular movements are intact. There is no lid lag. Sclera is anicteric.  ENT: Oropharynx is clear. Dentition is within normal limits.  NECK: Supple. The thyroid is not enlarged.  LYMPH: There are no masses or lymphadenopathy present.  HEART: Regular rate and rhythm with no m/g/r.  Normal S1/S2. No JVD LUNGS: Clear to auscultation There are no rales, rhonchi, or wheezes.  ABDOMEN: Soft, non-tender, and non-distended  with normoactive bowel sounds. There is no hepatosplenomegaly.  EXTREMITIES: No clubbing, cyanosis, or edema.  PULSES: Carotids were +2 and equal bilaterally with no bruits. Femoral pulses were +2 and equal bilaterally. DP/PT pulses were +2 and equal bilaterally.  SKIN: Warm, dry, and intact.  NEUROLOGIC: The patient was oriented to person, place, and time. No overt neurologic deficits were detected.  PSYCH: Normal judgment and insight, mood is appropriate.   Results: No results found for this or any previous visit (from the past 24 hour(s)). Outside Labs: trop 1.7, ckmb 23, BUN 16, Cr 0.78, hgb 12, plt 253, inr 1.0  EKG: NSR with t wave abnormality in inferior leads CXR: from [facility] was normal  Assessment/Plan: 61 yo WF with tobacco abuse, FH of CAD, and HTN here with NSTEMI. 1. NSTEMI - ASA 325 now then daily - continue heparin ggt - plavix 600mg  now and daily - metoprolol PO as tolerated, will hold norvasc in order to titrate metoprolol - lipitor 80mg  now and QHS - LHC possible PCI in AM  2. GERD:  - continue protonix and zantac   Quantina Dershem 12/13/2011, 4:49 AM

## 2011-12-13 NOTE — Consult Note (Signed)
Pt is a 1/2 ppd smoker who says " this time I really want to quit". She also wants to quit cold [place]. Encouraged pt to quit. Referred to 1-800 quit now for f/u and support. Discussed oral fixation substitutes, second hand smoke and in home smoking policy. Reviewed and gave pt Written education/contact information.

## 2011-12-14 ENCOUNTER — Encounter (HOSPITAL_COMMUNITY): Payer: Self-pay | Admitting: Physician Assistant

## 2011-12-14 ENCOUNTER — Other Ambulatory Visit: Payer: Self-pay

## 2011-12-14 DIAGNOSIS — I214 Non-ST elevation (NSTEMI) myocardial infarction: Principal | ICD-10-CM

## 2011-12-14 LAB — CBC
HCT: 36.7 % (ref 36.0–46.0)
Hemoglobin: 12.3 g/dL (ref 12.0–15.0)
MCH: 31.1 pg (ref 26.0–34.0)
RBC: 3.95 MIL/uL (ref 3.87–5.11)

## 2011-12-14 LAB — HEPARIN LEVEL (UNFRACTIONATED): Heparin Unfractionated: 0.73 IU/mL — ABNORMAL HIGH (ref 0.30–0.70)

## 2011-12-14 MED ORDER — METOPROLOL TARTRATE 25 MG PO TABS: 12.5000 mg | ORAL_TABLET | Freq: Two times a day (BID) | ORAL | Status: DC

## 2011-12-14 MED ORDER — ATORVASTATIN CALCIUM 20 MG PO TABS
20.0000 mg | ORAL_TABLET | Freq: Every day | ORAL | Status: DC
  Filled 2011-12-14: qty 1

## 2011-12-14 MED ORDER — NITROGLYCERIN 0.4 MG SL SUBL: 0.4000 mg | SUBLINGUAL_TABLET | SUBLINGUAL | Status: DC | PRN

## 2011-12-14 MED ORDER — RAMIPRIL 2.5 MG PO CAPS
2.5000 mg | ORAL_CAPSULE | Freq: Every day | ORAL | Status: DC
  Administered 2011-12-14: 2.5 mg via ORAL
  Filled 2011-12-14: qty 1

## 2011-12-14 MED ORDER — ATORVASTATIN CALCIUM 20 MG PO TABS: 20.0000 mg | ORAL_TABLET | Freq: Every day | ORAL | Status: DC

## 2011-12-14 MED ORDER — RANITIDINE HCL 150 MG PO TABS: 150.0000 mg | ORAL_TABLET | Freq: Two times a day (BID) | ORAL | Status: DC | PRN

## 2011-12-14 MED ORDER — PRAVASTATIN SODIUM 20 MG PO TABS: 20.0000 mg | ORAL_TABLET | Freq: Every day | ORAL | Status: DC

## 2011-12-14 MED ORDER — RAMIPRIL 2.5 MG PO CAPS: 2.5000 mg | ORAL_CAPSULE | Freq: Every day | ORAL | Status: DC

## 2011-12-14 MED ORDER — CLOPIDOGREL BISULFATE 75 MG PO TABS: 75.0000 mg | ORAL_TABLET | Freq: Every day | ORAL | Status: DC

## 2011-12-14 NOTE — Progress Notes (Signed)
Subjective:  Patient doing well post cath yesterday.  No CAD found but she does have inferior wall motion abnormality. EKG today shows increase in T wave inversion in III.  Troponins trending down.  Objective:  Vital Signs in the last 24 hours: Temp:  [97.5 F (36.4 C)-98.2 F (36.8 C)] 97.5 F (36.4 C) (03/08 0410) Pulse Rate:  [59-81] 61  (03/08 0410) Resp:  [12-22] 20  (03/08 0410) BP: (96-127)/(46-69) 118/68 mmHg (03/08 0410) SpO2:  [94 %-99 %] 95 % (03/08 0410) Weight:  [144 lb 2.9 oz (65.4 kg)] 144 lb 2.9 oz (65.4 kg) (03/07 0903)  Intake/Output from previous day: 03/07 0701 - 03/08 0700 In: 456.5 [I.V.:456.5] Out: 950 [Urine:950] Intake/Output from this shift:       . aspirin EC  81 mg Oral Daily  . atorvastatin  80 mg Oral q1800  . clopidogrel  75 mg Oral Q breakfast  . diazepam  5 mg Oral On [place]  . famotidine  20 mg Oral BID  . fentaNYL      . heparin      . lidocaine      . metoprolol tartrate  12.5 mg Oral BID  . midazolam      . nitroGLYCERIN      . pantoprazole  40 mg Oral Q1200  . DISCONTD: aspirin  324 mg Oral Pre-Cath  . DISCONTD: sodium chloride  3 mL Intravenous Q12H      . sodium chloride 150 mL/hr at 12/13/11 1311  . heparin 1,000 Units/hr (12/14/11 0230)  . DISCONTD: sodium chloride 100 mL/hr at 12/13/11 0600  . DISCONTD: sodium chloride    . DISCONTD: heparin Stopped (12/13/11 1110)  . DISCONTD: nitroGLYCERIN Stopped (12/13/11 1139)    Physical Exam: The patient appears to be in no distress.  Head and neck exam reveals that the pupils are equal and reactive.  The extraocular movements are full.  There is no scleral icterus.  Mouth and pharynx are benign.  No lymphadenopathy.  No carotid bruits.  The jugular venous pressure is normal.  Thyroid is not enlarged or tender.  Chest is clear to percussion and auscultation.  No rales or rhonchi.  Expansion of the chest is symmetrical.  Heart reveals no abnormal lift or heave.  First and  second heart sounds are normal.  There is no murmur gallop rub or click.  The abdomen is soft and nontender.  Bowel sounds are normoactive.  There is no hepatosplenomegaly or mass.  There are no abdominal bruits. Groin okay.  Extremities reveal no phlebitis or edema.  Pedal pulses are good.  There is no cyanosis or clubbing.  Neurologic exam is normal strength and no lateralizing weakness.  No sensory deficits.  Integument reveals no rash  Lab Results:  Basename 12/14/11 0610 12/13/11 0630  WBC 6.9 6.9  HGB 12.3 12.3  PLT 221 241    Basename 12/13/11 0630  NA 143  K 3.8  CL 105  CO2 32  GLUCOSE 96  BUN 13  CREATININE 0.82    Basename 12/13/11 1806 12/13/11 1417  TROPONINI 6.80* 7.40*   Hepatic Function Panel  Basename 12/13/11 0630  PROT 6.5  ALBUMIN 3.6  AST 36  ALT 13  ALKPHOS 58  BILITOT 0.3  BILIDIR --  IBILI --    Basename 12/13/11 0630  CHOL 159   No results found for this basename: PROTIME in the last 72 hours  Imaging: Imaging results have been reviewed.  Chest xray done at  [facility] normal.  Cardiac Studies:  Assessment/Plan:  Patient Active Hospital Problem List: NSTEMI (non-ST elevated myocardial infarction) (12/13/2011)    Involving inferior wall. Clean coronaries. GERD Tobacco abuse---states no further plans to smoke  Plan: home today on statin, ASA,plavix, ramipril, BB.  Continue protonix and prn pepcid or zantac. NTG SL for prn use Refer for outpatient cardiac rehab in [place]. May return to work Monday March 18 Medical followup with Dr. Neita Carp. Check BMET in 1 week since starting ACEi. Cardiology followup with me in 2-3 weeks for OV and EKG   LOS: 1 day    Cassell Clement 12/14/2011, 7:45 AM

## 2011-12-14 NOTE — Progress Notes (Signed)
   CARE MANAGEMENT NOTE 12/14/2011  Patient:  Jenna Hernandez, Jenna Hernandez   Account Number:  [id number]  Date Initiated:  12/14/2011  Documentation initiated by:  GRAVES-BIGELOW,Ryszard Socarras  Subjective/Objective Assessment:   Pt admitted with NSTEMI. Plan for home today with plavix. Pt is home alone has family support. Pt does have PCP MD: Consulting civil engineer in [place]. Pt has insurance and family will bring card in today and CM will fax to admitting.     Action/Plan:   CM did call [facility] to see the cost of plavix, nitro and pravastatin. No other needs assessed by Cm at this time.   Anticipated DC Date:  12/14/2011   Anticipated DC Plan:  HOME/SELF CARE      DC Planning Services  CM consult      Choice offered to / List presented to:             Status of service:  Completed, signed off Medicare Important Message given?   (If response is "NO", the following Medicare IM given date fields will be blank) Date Medicare IM given:   Date Additional Medicare IM given:    Discharge Disposition:  HOME/SELF CARE  Per UR Regulation:    Comments:  12-14-11 [id number] RN,BSN [phone] Price check for plavix 75 mg 18.00, nitrostat 22.00, prvastatin 10.00. Will make pt aware and Care coordinator will fax insurance card to admitting. No other needs assessed by CM at this time.

## 2011-12-14 NOTE — Progress Notes (Signed)
CARDIAC REHAB PHASE I   PRE:  Rate/Rhythm: 54SB  BP:  Supine:   Sitting: 130/70  Standing:    SaO2:   MODE:  Ambulation: 550 ft   POST:  Rate/Rhythem: 70  BP:  Supine:   Sitting: 130/80  Standing:    SaO2: 100%RA 0935-1040 Pt walked 550 ft with steady gait. Denied CP. Tolerated well. Permission given to refer to [facility] Phase 2. Encouraged smoking cessation. Education completed.  Jenna Hernandez

## 2011-12-14 NOTE — Discharge Summary (Signed)
Discharge Summary   Patient ID: Jenna Hernandez MRN: [id number], DOB/AGE: 1951/10/04 61 y.o. Admit date: 12/13/2011 D/C date:     12/14/2011   Primary Discharge Diagnoses:  1. NSTEMI with smooth and normal coronaries by cath 12/13/11 - possibly secondary to transient thrombosis with resolution of thrombus - will also need follow-up lipids/LFTs in 6 weeks since started on statin - normal EF 65% with small area of hypokinesis in the mid inferior wall  Secondary Discharge Diagnoses:  1. GERD 2. Fibromyalgia  Hospital Course: 61 y/o F with hx of GERD and fibromyalgia who presented to [facility] with complaints of worsening epigastric burning initially not particularly related to exertion. However, yesterday her pain began radiating up into her jaw and chest and was constant. She called her PCP who saw her in clinic and got an EKG (reported as normal) as well as troponin that came back elevated. She was then sent to the ER and transferred to [facility]. Troponin at outside hospital came back at 1.7, with EKG demonstrating NSR with t wave abnormality in inferior leads. CXR was reportedly normal. She was admitted to the hospital and loaded with Plavix, aspirin, and started on BB and lipitor. Her Norvasc was held in order to titrate metoprolol. He was also placed on heparin. Troponin continued to rise up to a peak of 7.70. She underwent heart cath demonstrating: Conclusions: 1. Smooth and normal coronary arteries  2. Normal left ventricular systolic function but with a small area of severe hypokinesis in the mid inferior wall. This may have been due to transient thrombosis with resolution of the thrombus. I doubt she had proximal RCA spasm or thrombosis given the small size of the wall motion defect.  She was continued on heparin per pharmacy for 24 hours as well as aspirin, BB, Plavix and ACEI. Today she is doing well. Troponins have trended downward. Dr. Patty Sermons has seen and examined her  today and feels she is stable for discharge. Dr. Patty Sermons would like her to have a BMET in 1 week the follow up with him in 2-3 weeks for an office visit and EKG. Please note her statin was changed at discharge due to self-pay issues.   Discharge Vitals: Blood pressure 118/68, pulse 61, temperature 97.5 F (36.4 C), temperature source Oral, resp. rate 20, height 5\' 3"  (1.6 m), weight 144 lb 2.9 oz (65.4 kg), SpO2 95.00%.  Labs: Lab Results  Component Value Date   WBC 6.9 12/14/2011   HGB 12.3 12/14/2011   HCT 36.7 12/14/2011   MCV 92.9 12/14/2011   PLT 221 12/14/2011     Lab 12/13/11 0630  NA 143  K 3.8  CL 105  CO2 32  BUN 13  CREATININE 0.82  CALCIUM 9.7  PROT 6.5  BILITOT 0.3  ALKPHOS 58  ALT 13  AST 36  GLUCOSE 96    Basename 12/13/11 1806 12/13/11 1417 12/13/11 0630  CKTOTAL 317* 333* 349*  CKMB 20.3* 26.5* 36.7*  TROPONINI 6.80* 7.40* 7.70*   Lab Results  Component Value Date   CHOL 159 12/13/2011   HDL 62 12/13/2011   LDLCALC 74 12/13/2011   TRIG 116 12/13/2011   Diagnostic Studies/Procedures   1. Cardiac catheterization this admission, please see full report and above for summary.  Discharge Medications   Medication List  As of 12/14/2011 10:38 AM   STOP taking these medications         amLODipine 5 MG tablet      esomeprazole  40 MG capsule         TAKE these medications         acetaminophen 500 MG tablet   Commonly known as: TYLENOL   Take 1,000 mg by mouth every 6 (six) hours as needed.      ALPRAZolam 0.25 MG tablet   Commonly known as: XANAX   Take 0.25 mg by mouth at bedtime as needed.      aspirin 81 MG tablet   Take 81 mg by mouth daily.      clopidogrel 75 MG tablet   Commonly known as: PLAVIX   Take 1 tablet (75 mg total) by mouth daily with breakfast.      metoprolol tartrate 25 MG tablet   Commonly known as: LOPRESSOR   Take 0.5 tablets (12.5 mg total) by mouth 2 (two) times daily.      nitroGLYCERIN 0.4 MG SL tablet   Commonly known  as: NITROSTAT   Place 1 tablet (0.4 mg total) under the tongue every 5 (five) minutes x 3 doses as needed for chest pain.      pantoprazole 40 MG tablet   Commonly known as: PROTONIX   Take 40 mg by mouth daily.      pravastatin 20 MG tablet   Commonly known as: PRAVACHOL   Take 1 tablet (20 mg total) by mouth daily.      ramipril 2.5 MG capsule   Commonly known as: ALTACE   Take 1 capsule (2.5 mg total) by mouth daily.      ranitidine 150 MG tablet   Commonly known as: ZANTAC   Take 1 tablet (150 mg total) by mouth 2 (two) times daily as needed for heartburn.            Disposition   The patient will be discharged in stable condition to home. Discharge Orders    Future Appointments: Provider: Department: Dept Phone: Center:   12/31/2011 9:00 AM Cassell Clement, MD [facility] [phone] None     Future Orders Please Complete By Expires   Diet - low sodium heart healthy      Increase activity slowly      Comments:   No driving for 1 week. No lifting over 10 lbs for 2 weeks. No sexual activity for 2 weeks. You may return to work on 12/24/11. Keep procedure site clean & dry. If you notice increased pain, swelling, bleeding or pus, call/return!  You may shower, but no soaking baths/hot tubs/pools for 1 week.     Discharge instructions      Comments:   Take lab slip to the [facility] in [place] to have your electrolytes/kidney function checked on 12/21/11 since we started new medicines here in the hospital. If you need directions, you can contact the [facility] office at [phone].     Follow-up Information    Follow up with Dr. Neita Carp. (As scheduled)       Follow up with Cassell Clement, MD. (12/31/11 at 9am)    Contact information:   [address] [place] [address] [phone]            Duration of Discharge Encounter: 38 minutes including physician and PA time.  Signed, Ronie Spies PA-C 12/14/2011, 10:38 AM

## 2011-12-14 NOTE — Progress Notes (Signed)
ANTICOAGULATION CONSULT NOTE - Follow Up Consult  Pharmacy Consult for heparin Indication: NSTEMI  Labs:  Basename 12/14/11 0117 12/13/11 1806 12/13/11 1417 12/13/11 0630  HGB -- -- -- 12.3  HCT -- -- -- 36.6  PLT -- -- -- 241  APTT -- -- -- 45*  LABPROT -- -- -- 13.7  INR -- -- -- 1.03  HEPARINUNFRC 0.73* -- -- 0.16*  CREATININE -- -- -- 0.82  CKTOTAL -- 317* 333* 349*  CKMB -- 20.3* 26.5* 36.7*  TROPONINI -- 6.80* 7.40* 7.70*   Assessment: 60yo female slightly supratherapeutic on heparin after resumed post-cath.  Planned to run until ~1800 this pm.  Goal of Therapy:  Heparin level 0.3-0.7 units/ml   Plan:  Will decrease heparin gtt by ~1 unit/kg/hr to 1000 units/hr and check level in ~6hr.  Colleen Can PharmD BCPS 12/14/2011,2:20 AM

## 2011-12-18 ENCOUNTER — Telehealth: Payer: Self-pay | Admitting: Cardiology

## 2011-12-18 NOTE — Telephone Encounter (Signed)
Need to call tomorrow and get patient scheduled for cardiac rehab in [place]

## 2011-12-18 NOTE — Telephone Encounter (Signed)
Fu call °Pt was returning your call °

## 2011-12-20 NOTE — Telephone Encounter (Signed)
Information faxed to cardiac rehab in [place]

## 2011-12-21 ENCOUNTER — Encounter: Payer: Self-pay | Admitting: Cardiology

## 2011-12-25 ENCOUNTER — Telehealth: Payer: Self-pay | Admitting: Cardiology

## 2011-12-25 ENCOUNTER — Encounter: Payer: Self-pay | Admitting: Internal Medicine

## 2011-12-25 NOTE — Telephone Encounter (Signed)
New msg Pt has appt on [phone] and wanted to make sure blood work results was received from [facility] in [place]

## 2011-12-25 NOTE — Telephone Encounter (Signed)
Pt was notified that labs are in the chart.

## 2011-12-26 DIAGNOSIS — R079 Chest pain, unspecified: Secondary | ICD-10-CM

## 2011-12-28 ENCOUNTER — Telehealth: Payer: Self-pay | Admitting: Cardiology

## 2011-12-28 NOTE — Telephone Encounter (Signed)
Discussed with  Dr. Patty Sermons and ok for patient to return to work 4/1. In the hospital in [place] 3/20-3/21

## 2011-12-28 NOTE — Telephone Encounter (Signed)
Calling about fmla papers and she needs to add an extra week to them because she has been in hospital again

## 2011-12-28 NOTE — Telephone Encounter (Signed)
Fu call Patient calling back again 

## 2011-12-31 ENCOUNTER — Encounter: Payer: Self-pay | Admitting: Cardiology

## 2012-01-01 ENCOUNTER — Encounter: Payer: Self-pay | Admitting: Cardiology

## 2012-01-01 ENCOUNTER — Ambulatory Visit (INDEPENDENT_AMBULATORY_CARE_PROVIDER_SITE_OTHER): Payer: PRIVATE HEALTH INSURANCE | Admitting: Cardiology

## 2012-01-01 VITALS — BP 127/78 | HR 61 | Wt 147.0 lb

## 2012-01-01 DIAGNOSIS — I2510 Atherosclerotic heart disease of native coronary artery without angina pectoris: Secondary | ICD-10-CM | POA: Insufficient documentation

## 2012-01-01 DIAGNOSIS — F411 Generalized anxiety disorder: Secondary | ICD-10-CM

## 2012-01-01 DIAGNOSIS — K219 Gastro-esophageal reflux disease without esophagitis: Secondary | ICD-10-CM

## 2012-01-01 DIAGNOSIS — I214 Non-ST elevation (NSTEMI) myocardial infarction: Secondary | ICD-10-CM

## 2012-01-01 DIAGNOSIS — F419 Anxiety disorder, unspecified: Secondary | ICD-10-CM | POA: Insufficient documentation

## 2012-01-01 NOTE — Assessment & Plan Note (Signed)
She has a history of suspected food.  She sometimes has trouble differentiating her GERD pain from her heart pain.  She should try sublingual nitroglycerin

## 2012-01-01 NOTE — Assessment & Plan Note (Signed)
The patient was separated from her husband last fall and that has been stressful.  She also had to give away her dog who had become too large for her household and that was also very stressful recently.  She does have Xanax on hand for when necessary use.

## 2012-01-01 NOTE — Progress Notes (Signed)
Jenna Hernandez Jenna Hernandez Date of Birth:  1951/06/08 [facility] [address] [place], [place]  [address] [phone]         Fax   [phone]  History of Present Illness: This 61 year old woman is seen back for a followup office visit.  She has a history of a recent non-STEMI on 12/13/11.  He underwent cardiac catheterization on 12/13/11 which showed smooth and normal coronary arteries it was felt that she had had a myocardial infarction secondary to possible transient specimen thrombosis.  He did have an area of hypokinesis in the mid inferior wall but her overall ejection fraction was 65%.  The patient was readmitted to the hospital he needed with chest pain on 12/26/11 and was discharged the following day after her enzymes ruled out.  Dr. Kirke Corin was the admitting doctor that evening and reviewed her catheter films and felt that she may have had a small branch occlusion off the right coronary artery to explain her motion abnormalities and elevated enzymes on her initial admission.  The patient has been feeling better.  She started cardiac rehabilitation at [facility] today.  Current Outpatient Prescriptions  Medication Sig Dispense Refill  . acetaminophen (TYLENOL) 500 MG tablet Take 1,000 mg by mouth every 6 (six) hours as needed.      . ALPRAZolam (XANAX) 0.25 MG tablet Take 0.25 mg by mouth at bedtime as needed.      Marland Kitchen aspirin 81 MG tablet Take 81 mg by mouth daily.      . clopidogrel (PLAVIX) 75 MG tablet Take 1 tablet (75 mg total) by mouth daily with breakfast.  30 tablet  6  . isosorbide mononitrate (IMDUR) 30 MG 24 hr tablet Take 30 mg by mouth daily.      . metoprolol tartrate (LOPRESSOR) 25 MG tablet Take 0.5 tablets (12.5 mg total) by mouth 2 (two) times daily.  60 tablet  6  . nitroGLYCERIN (NITROSTAT) 0.4 MG SL tablet Place 1 tablet (0.4 mg total) under the tongue every 5 (five) minutes x 3 doses as needed for chest pain.  25 tablet  4  .  pantoprazole (PROTONIX) 40 MG tablet Take 40 mg by mouth daily.      . pravastatin (PRAVACHOL) 20 MG tablet Take 1 tablet (20 mg total) by mouth daily.  30 tablet  6  . ramipril (ALTACE) 2.5 MG capsule Take 1 capsule (2.5 mg total) by mouth daily.  30 capsule  6  . ranitidine (ZANTAC) 150 MG tablet Take 1 tablet (150 mg total) by mouth 2 (two) times daily as needed for heartburn.        Allergies  Allergen Reactions  . Voltaren Anaphylaxis    Tolerates aspirin  . Mepergan     headache  . Sulfa Antibiotics   . Codeine Rash    Patient Active Problem List  Diagnoses  . NSTEMI (non-ST elevated myocardial infarction)  . Anxiety  . CAD (coronary artery disease)  . GERD (gastroesophageal reflux disease)    History  Smoking status  . Current Everyday Smoker -- 0.5 packs/day  . Types: Cigarettes  Smokeless tobacco  . Not on file    History  Alcohol Use No    Family History  Problem Relation Age of Onset  . Coronary artery disease Father 1    Review of Systems: Constitutional: no fever chills diaphoresis or fatigue or change in weight.  Head and neck: no hearing loss, no epistaxis, no photophobia or visual disturbance. Respiratory:  No cough, shortness of breath or wheezing. Cardiovascular: No chest pain peripheral edema, palpitations. Gastrointestinal: No abdominal distention, no abdominal pain, no change in bowel habits hematochezia or melena. Genitourinary: No dysuria, no frequency, no urgency, no nocturia. Musculoskeletal:No arthralgias, no back pain, no gait disturbance or myalgias. Neurological: No dizziness, no headaches, no numbness, no seizures, no syncope, no weakness, no tremors. Hematologic: No lymphadenopathy, no easy bruising. Psychiatric: No confusion, no hallucinations, no sleep disturbance.    Physical Exam: Filed Vitals:   01/01/12 1535  BP: 127/78  Pulse: 61   the general appearance reveals a well-developed well-nourished woman in no distress.The  head and neck exam reveals pupils equal and reactive.  Extraocular movements are full.  There is no scleral icterus.  The mouth and pharynx are normal.  The neck is supple.  The carotids reveal no bruits.  The jugular venous pressure is normal.  The  thyroid is not enlarged.  There is no lymphadenopathy.  The chest is clear to percussion and auscultation.  There are no rales or rhonchi.  Expansion of the chest is symmetrical.  The precordium is quiet.  The first heart sound is normal.  The second heart sound is physiologically split.  There is no murmur gallop rub or click.  There is no abnormal lift or heave.  The abdomen is soft and nontender.  The bowel sounds are normal.  The liver and spleen are not enlarged.  There are no abdominal masses.  There are no abdominal bruits.  Extremities reveal good pedal pulses.  There is no phlebitis or edema.  There is no cyanosis or clubbing.  Strength is normal and symmetrical in all extremities.  There is no lateralizing weakness.  There are no sensory deficits.  The skin is warm and dry.  There is no rash.     Assessment / Plan: Continue present medication.  Use sublingual nitroglycerin if necessary.  Return to work on Monday, April 1.  She should see Dr. Neita Carp in about 6 weeks and get fasting lipid panel to followup on her pravastatin and also get chemistries to check her liver etc.. she will recheck here in 3 months for followup office visit and EKG

## 2012-01-01 NOTE — Assessment & Plan Note (Signed)
The patient is on pravastatin for her hypercholesterolemia.  She should have a fasting lipid panel with chemistries in about 6 weeks and she'll plan to get that at Dr. Dian Situ office

## 2012-01-01 NOTE — Patient Instructions (Signed)
Your physician recommends that you continue on your current medications as directed. Please refer to the Current Medication list given to you today. Your physician recommends that you schedule a follow-up appointment in: 3 months ov and ekg

## 2012-01-01 NOTE — Assessment & Plan Note (Signed)
The patient continues to have occasional tightness in her throat.  She has not tried sublingual nitroglycerin but I have encouraged her to try it.  She has occasional arm pain but also he carries the diagnosis of fibromyalgia and realizes that a lot of her symptoms may be related to the fibromyalgia.  He should try the nitroglycerin to see what response she might expect.

## 2012-02-07 ENCOUNTER — Telehealth: Payer: Self-pay | Admitting: Cardiology

## 2012-02-07 DIAGNOSIS — R079 Chest pain, unspecified: Secondary | ICD-10-CM

## 2012-02-07 NOTE — Telephone Encounter (Signed)
Patient states is having chest pain from the middle of her left side of chest to the nipple  she has taken two NTG's SL, the pain goes away as soon as she takes the  Medication, but it comes back again, and now she is nauseated. Pain score now is" 3-4". Also she has taken Tylenol, and Zanex and nothing helps. Patient lives in [place], so she will go to [facility] hospital ED.

## 2012-02-07 NOTE — Telephone Encounter (Signed)
New Problem ° ° °

## 2012-02-08 DIAGNOSIS — I2510 Atherosclerotic heart disease of native coronary artery without angina pectoris: Secondary | ICD-10-CM

## 2012-02-11 ENCOUNTER — Other Ambulatory Visit: Payer: Self-pay | Admitting: Physician Assistant

## 2012-02-11 DIAGNOSIS — R072 Precordial pain: Secondary | ICD-10-CM

## 2012-03-17 ENCOUNTER — Telehealth: Payer: Self-pay | Admitting: Cardiology

## 2012-03-17 NOTE — Telephone Encounter (Signed)
Will forward to  Dr. Brackbill for review 

## 2012-03-17 NOTE — Telephone Encounter (Signed)
New msg Pt said she has a dry cough and she takes  Ramipril. Please call to discuss

## 2012-03-17 NOTE — Telephone Encounter (Signed)
Stop ramipril and use losartan 50 mg daily.

## 2012-03-18 NOTE — Telephone Encounter (Signed)
okay

## 2012-03-18 NOTE — Telephone Encounter (Signed)
Patient has had a sore throat and sneezing for about a week.  Dry cough started after.  Is going to try plain Robitussin and continue Ramipril since she just filled it.  No coughing this am but a lot of sneezing.  Has appointment on 6/28 and will discuss then.

## 2012-03-31 ENCOUNTER — Telehealth: Payer: Self-pay | Admitting: Cardiology

## 2012-03-31 NOTE — Telephone Encounter (Signed)
Please return call to patient at [phone] ext [phone] until 5:30pm  Patient thinks she may be having issues with medications, please return call to discuss Ramipril hold.

## 2012-03-31 NOTE — Telephone Encounter (Signed)
Has a dry hacking cough and is concerned may be coming from Ramipril, has appointment Friday.  Will forward to  Dr. Patty Sermons for review (patient ok with waiting until Wednesday)

## 2012-03-31 NOTE — Telephone Encounter (Signed)
Leave off ramipril starting now.

## 2012-04-02 NOTE — Telephone Encounter (Signed)
Advised patient, verbalized understanding  

## 2012-04-03 ENCOUNTER — Ambulatory Visit: Payer: Self-pay | Admitting: Cardiology

## 2012-04-04 ENCOUNTER — Ambulatory Visit: Payer: Self-pay | Admitting: Cardiology

## 2012-04-04 ENCOUNTER — Encounter: Payer: Self-pay | Admitting: Cardiology

## 2012-04-04 ENCOUNTER — Ambulatory Visit (INDEPENDENT_AMBULATORY_CARE_PROVIDER_SITE_OTHER): Payer: PRIVATE HEALTH INSURANCE | Admitting: Cardiology

## 2012-04-04 VITALS — BP 118/80 | HR 79 | Ht 63.0 in | Wt 150.0 lb

## 2012-04-04 DIAGNOSIS — I214 Non-ST elevation (NSTEMI) myocardial infarction: Secondary | ICD-10-CM

## 2012-04-04 DIAGNOSIS — I2510 Atherosclerotic heart disease of native coronary artery without angina pectoris: Secondary | ICD-10-CM

## 2012-04-04 DIAGNOSIS — T44905A Adverse effect of unspecified drugs primarily affecting the autonomic nervous system, initial encounter: Secondary | ICD-10-CM

## 2012-04-04 DIAGNOSIS — T464X5A Adverse effect of angiotensin-converting-enzyme inhibitors, initial encounter: Secondary | ICD-10-CM | POA: Insufficient documentation

## 2012-04-04 DIAGNOSIS — R05 Cough: Secondary | ICD-10-CM

## 2012-04-04 MED ORDER — LOSARTAN POTASSIUM 25 MG PO TABS: 25.0000 mg | ORAL_TABLET | Freq: Every day | ORAL | Status: DC

## 2012-04-04 NOTE — Patient Instructions (Signed)
Continue off Ramipril and will send Rx for Losartan.   Dr. Patty Sermons wants you to wait a few weeks until cough resolved to start Losartan  Your physician wants you to follow-up in: 4 months You will receive a reminder letter in the mail two months in advance. If you don't receive a letter, please call our office to schedule the follow-up appointment.

## 2012-04-04 NOTE — Assessment & Plan Note (Signed)
No further chest pain or angina pectoris.  She is back to full activity.

## 2012-04-04 NOTE — Progress Notes (Signed)
Jenna Hernandez Jenna Hernandez Date of Birth:  October 07, 1951 [facility] [address] [place], [place]  [address] [phone]  Fax   [phone]  HPI: This pleasant 61 year old woman is seen for a scheduled four-month followup office visit.  She has a history of a non-STEMI on 12/13/11.  She underwent cardiac catheterization which showed smooth and normal coronary arteries it was felt that she had a myocardial infarction secondary to possible transient spasm with thrombosis since she did have some hypokinesis in the mid inferior wall the overall ejection fraction was 65%.  She was readmitted to the hospital on 12/26/11 with chest pain and was discharged the following day after ruling out.  He did have her readmission her initial cardiac catheter films were reviewed by the admitting physician Dr. Racheal Patches who felt that she might have had a small branch occlusion of off the right coronary artery to explain her wall motion abnormalities.  Patient has been doing well with no recurrent chest pain.  She has had a bad cough probably secondary to rather ramipril.  Current Outpatient Prescriptions  Medication Sig Dispense Refill  . acetaminophen (TYLENOL) 500 MG tablet Take 1,000 mg by mouth every 6 (six) hours as needed.      . ALPRAZolam (XANAX) 0.25 MG tablet Take 0.25 mg by mouth at bedtime as needed.      Marland Kitchen aspirin 81 MG tablet Take 81 mg by mouth daily.      . clopidogrel (PLAVIX) 75 MG tablet Take 1 tablet (75 mg total) by mouth daily with breakfast.  30 tablet  6  . isosorbide mononitrate (IMDUR) 30 MG 24 hr tablet Take 30 mg by mouth daily.      . metoprolol tartrate (LOPRESSOR) 25 MG tablet Take 0.5 tablets (12.5 mg total) by mouth 2 (two) times daily.  60 tablet  6  . nitroGLYCERIN (NITROSTAT) 0.4 MG SL tablet Place 1 tablet (0.4 mg total) under the tongue every 5 (five) minutes x 3 doses as needed for chest pain.  25 tablet  4  . pantoprazole (PROTONIX) 40 MG tablet Take 40 mg by  mouth daily.      . pravastatin (PRAVACHOL) 20 MG tablet Take 1 tablet (20 mg total) by mouth daily.  30 tablet  6  . ranitidine (ZANTAC) 150 MG tablet Take 1 tablet (150 mg total) by mouth 2 (two) times daily as needed for heartburn.      . losartan (COZAAR) 25 MG tablet Take 1 tablet (25 mg total) by mouth daily.  30 tablet  5    Allergies  Allergen Reactions  . Diclofenac Sodium Anaphylaxis    Tolerates aspirin  . Meperidine-Promethazine     headache  . Ramipril     cough  . Sulfa Antibiotics   . Codeine Rash    Patient Active Problem List  Diagnosis  . NSTEMI (non-ST elevated myocardial infarction)  . Anxiety  . CAD (coronary artery disease)  . GERD (gastroesophageal reflux disease)    History  Smoking status  . Former Smoker -- 0.5 packs/day  . Types: Cigarettes  Smokeless tobacco  . Not on file    History  Alcohol Use No    Family History  Problem Relation Age of Onset  . Coronary artery disease Father 25    Review of Systems: The patient denies any heat or cold intolerance.  No weight gain or weight loss.  The patient denies headaches or blurry vision.  There is no cough or sputum  production.  The patient denies dizziness.  There is no hematuria or hematochezia.  The patient denies any muscle aches or arthritis.  The patient denies any rash.  The patient denies frequent falling or instability.  There is no history of depression or anxiety.  All other systems were reviewed and are negative.   Physical Exam: Filed Vitals:   04/04/12 1512  BP: 118/80  Pulse: 79   the general appearance reveals a well-developed well-nourished woman in no distress.The head and neck exam reveals pupils equal and reactive.  Extraocular movements are full.  There is no scleral icterus.  The mouth and pharynx are normal.  The neck is supple.  The carotids reveal no bruits.  The jugular venous pressure is normal.  The  thyroid is not enlarged.  There is no lymphadenopathy.  The chest  is clear to percussion and auscultation.  There are no rales or rhonchi.  Expansion of the chest is symmetrical.  The precordium is quiet.  The first heart sound is normal.  The second heart sound is physiologically split.  There is no murmur gallop rub or click.  There is no abnormal lift or heave.  The abdomen is soft and nontender.  The bowel sounds are normal.  The liver and spleen are not enlarged.  There are no abdominal masses.  There are no abdominal bruits.  Extremities reveal good pedal pulses.  There is no phlebitis or edema.  There is no cyanosis or clubbing.  Strength is normal and symmetrical in all extremities.  There is no lateralizing weakness.  There are no sensory deficits.  The skin is warm and dry.  There is no rash.  EKG shows normal sinus rhythm with nonspecific inferior wall T-wave changes and is unchanged since 12/13/11   Assessment / Plan:  Continue same meds except stop her Monopril and after cessation of cough she will start losartan.  Recheck in 4 months for followup office visit.

## 2012-04-04 NOTE — Assessment & Plan Note (Signed)
She has had a worsening terrible dry tickling cough.  This is almost certainly secondary to her ACE inhibitor.  She stopped the ACE inhibitor yesterday.  She understands it may take several weeks per her symptoms to go away.  Once she is back to baseline and not coughing we will have her start losartan 25 mg one daily

## 2012-04-09 ENCOUNTER — Telehealth: Payer: Self-pay | Admitting: Cardiology

## 2012-04-09 NOTE — Telephone Encounter (Signed)
New Problem:    Patient called in because despit her being taken off of ramapril she is still have her problem.  Patient is feeling discouraged about this and would like to speak with you.  Please call back.

## 2012-04-09 NOTE — Telephone Encounter (Signed)
Patient still has dry cough, discussed with  Dr. Patty Sermons and will wait one more week and call back with update.  Advised patient, she will call next week with update

## 2012-04-15 ENCOUNTER — Telehealth: Payer: Self-pay | Admitting: Cardiology

## 2012-04-15 NOTE — Telephone Encounter (Signed)
Pt to call  back today with an update,  still coughing and exhausted

## 2012-04-15 NOTE — Telephone Encounter (Signed)
Will send order for xray.  What does she need to do about starting the losartan, was told to wait until after cough better.  Will forward to  Dr. Patty Sermons

## 2012-04-15 NOTE — Telephone Encounter (Signed)
Do not start the losartan yet.

## 2012-04-15 NOTE — Telephone Encounter (Signed)
Advise get chest x-ray to evaluate her cough further

## 2012-04-15 NOTE — Telephone Encounter (Signed)
Advised patient and will wait for chest xray

## 2012-04-15 NOTE — Telephone Encounter (Signed)
Since cough no better will forward to  Dr. Patty Sermons for review

## 2012-04-16 ENCOUNTER — Encounter: Payer: Self-pay | Admitting: Cardiology

## 2012-04-16 ENCOUNTER — Telehealth: Payer: Self-pay | Admitting: Cardiology

## 2012-04-16 NOTE — Telephone Encounter (Signed)
New Problem:    Patient just had her chest x-ray done and it should be up in the system later today.

## 2012-04-17 NOTE — Telephone Encounter (Signed)
Requested xray

## 2012-04-18 NOTE — Telephone Encounter (Signed)
Advised chest Xray. Cough could be residual from ACE.  Do not start ARB until cough gone.  If continues may refer to pulmonary. All of this per  Dr. Patty Sermons.  Patient will call back if no better

## 2012-05-19 DIAGNOSIS — R079 Chest pain, unspecified: Secondary | ICD-10-CM

## 2012-07-11 ENCOUNTER — Other Ambulatory Visit: Payer: Self-pay | Admitting: Physician Assistant

## 2012-07-26 ENCOUNTER — Other Ambulatory Visit: Payer: Self-pay | Admitting: Physician Assistant

## 2012-07-28 ENCOUNTER — Other Ambulatory Visit: Payer: Self-pay | Admitting: Cardiology

## 2012-08-05 ENCOUNTER — Ambulatory Visit (INDEPENDENT_AMBULATORY_CARE_PROVIDER_SITE_OTHER): Payer: PRIVATE HEALTH INSURANCE | Admitting: Cardiology

## 2012-08-05 ENCOUNTER — Encounter: Payer: Self-pay | Admitting: Cardiology

## 2012-08-05 VITALS — BP 124/79 | HR 72 | Ht 63.0 in | Wt 159.0 lb

## 2012-08-05 DIAGNOSIS — F419 Anxiety disorder, unspecified: Secondary | ICD-10-CM

## 2012-08-05 DIAGNOSIS — T44905A Adverse effect of unspecified drugs primarily affecting the autonomic nervous system, initial encounter: Secondary | ICD-10-CM

## 2012-08-05 DIAGNOSIS — F411 Generalized anxiety disorder: Secondary | ICD-10-CM

## 2012-08-05 DIAGNOSIS — R05 Cough: Secondary | ICD-10-CM

## 2012-08-05 DIAGNOSIS — I2510 Atherosclerotic heart disease of native coronary artery without angina pectoris: Secondary | ICD-10-CM

## 2012-08-05 DIAGNOSIS — I259 Chronic ischemic heart disease, unspecified: Secondary | ICD-10-CM

## 2012-08-05 NOTE — Assessment & Plan Note (Signed)
Her cough has resolved since switching to losartan

## 2012-08-05 NOTE — Assessment & Plan Note (Signed)
The patient is undergoing a lot of anxiety issues.  She will followup with her primary care concerning this.  She also has fibromyalgia.  She has also had some sinus congestion for which she will take Mucinex generic

## 2012-08-05 NOTE — Assessment & Plan Note (Signed)
The patient has not been experiencing any exertional chest discomfort.  She has had some frequent bilateral jaw pain which could be an anginal equivalent.  I have suggested that she try taking a sublingual nitroglycerin the next time that she has her jaw pain and see if it relieves it.  At her previous catheterization she showed smooth and normal coronary arteries and a spasm was implicated in her non-STEMI.

## 2012-08-05 NOTE — Patient Instructions (Addendum)
Your physician recommends that you schedule a follow-up appointment in: 4 months with EKG  Your physician recommends that you continue on your current medications as directed. Please refer to the Current Medication list given to you today.

## 2012-08-05 NOTE — Progress Notes (Signed)
Jenna Hernandez [name] Date of Birth:  [birth year] [facility] [address] [place], [place]  [address] [phone]  Fax   [phone]  HPI: This pleasant 61 year old woman is seen for a scheduled four-month followup office visit. She has a history of a non-STEMI on 12/13/11. She underwent cardiac catheterization which showed smooth and normal coronary arteries it was felt that she had a myocardial infarction secondary to possible transient spasm with thrombosis since she did have some hypokinesis in the mid inferior wall the overall ejection fraction was 65%. She was readmitted to the hospital on 12/26/11 with chest pain and was discharged the following day after ruling out. He did have her readmission her initial cardiac catheter films were reviewed by the admitting physician Dr. Racheal Patches who felt that she might have had a small branch occlusion of off the right coronary artery to explain her wall motion abnormalities. Patient has been doing well with no recurrent chest pain.  She has had some bilateral jaw pain and recently saw her dentist who diagnosed a problem with some bone loss.  The patient admits to being under a lot of stress.  She has stress at home and at work.  Her work consists of sitting at a [employer] all day and she is not getting any regular exercise   Current Outpatient Prescriptions  Medication Sig Dispense Refill  . acetaminophen (TYLENOL) 500 MG tablet Take 1,000 mg by mouth every 6 (six) hours as needed.      . ALPRAZolam (XANAX) 0.25 MG tablet Take 0.25 mg by mouth at bedtime as needed.      Marland Kitchen aspirin 81 MG tablet Take 81 mg by mouth daily.      . clopidogrel (PLAVIX) 75 MG tablet TAKE ONE TABLET BY MOUTH IN THE MORNING WITH BREAKFAST  30 tablet  5  . isosorbide mononitrate (IMDUR) 30 MG 24 hr tablet TAKE ONE TABLET BY MOUTH EVERY DAY  30 tablet  5  . losartan (COZAAR) 25 MG tablet Take 1 tablet (25 mg total) by mouth daily.  30 tablet  5  .  metoprolol tartrate (LOPRESSOR) 25 MG tablet Take 0.5 tablets (12.5 mg total) by mouth 2 (two) times daily.  60 tablet  6  . nitroGLYCERIN (NITROSTAT) 0.4 MG SL tablet Place 1 tablet (0.4 mg total) under the tongue every 5 (five) minutes x 3 doses as needed for chest pain.  25 tablet  4  . pantoprazole (PROTONIX) 40 MG tablet Take 40 mg by mouth daily.      . pravastatin (PRAVACHOL) 20 MG tablet Take 1 tablet (20 mg total) by mouth daily.  30 tablet  6  . ranitidine (ZANTAC) 150 MG tablet Take 1 tablet (150 mg total) by mouth 2 (two) times daily as needed for heartburn.        Allergies  Allergen Reactions  . Diclofenac Sodium Anaphylaxis    Tolerates aspirin  . Meperidine-Promethazine     headache  . Ramipril     cough  . Sulfa Antibiotics   . Codeine Rash    Patient Active Problem List  Diagnosis  . NSTEMI (non-ST elevated myocardial infarction)  . Anxiety  . CAD (coronary artery disease)  . GERD (gastroesophageal reflux disease)  . Cough due to ACE inhibitor    History  Smoking status  . Former Smoker -- 0.5 packs/day  . Types: Cigarettes  Smokeless tobacco  . Not on file    History  Alcohol Use No  Family History  Problem Relation Age of Onset  . Coronary artery disease Father 10    Review of Systems: The patient denies any heat or cold intolerance.  No weight gain or weight loss.  The patient denies headaches or blurry vision.  There is no cough or sputum production.  The patient denies dizziness.  There is no hematuria or hematochezia.  The patient denies any muscle aches or arthritis.  The patient denies any rash.  The patient denies frequent falling or instability.  There is no history of depression or anxiety.  All other systems were reviewed and are negative.   Physical Exam: Filed Vitals:   08/05/12 1355  BP: 124/79  Pulse: 72   the general appearance reveals a well-developed well-nourished woman in no distress.The head and neck exam reveals pupils  equal and reactive.  Extraocular movements are full.  There is no scleral icterus.  The mouth and pharynx are normal.  The neck is supple.  The carotids reveal no bruits.  The jugular venous pressure is normal.  The  thyroid is not enlarged.  There is no lymphadenopathy.  The chest is clear to percussion and auscultation.  There are no rales or rhonchi.  Expansion of the chest is symmetrical.  The precordium is quiet.  The first heart sound is normal.  The second heart sound is physiologically split.  There is no murmur gallop rub or click.  There is no abnormal lift or heave.  The abdomen is soft and nontender.  The bowel sounds are normal.  The liver and spleen are not enlarged.  There are no abdominal masses.  There are no abdominal bruits.  Extremities reveal good pedal pulses.  There is no phlebitis or edema.  There is no cyanosis or clubbing.  Strength is normal and symmetrical in all extremities.  There is no lateralizing weakness.  There are no sensory deficits.  The skin is warm and dry.  There is no rash.      Assessment / Plan: Continue same medication.  Trial of sublingual nitroglycerin for jaw pain.  Recheck in 4 months for followup office visit and EKG.  Her PCP follows her lipids.

## 2012-08-14 DIAGNOSIS — R6884 Jaw pain: Secondary | ICD-10-CM

## 2012-08-15 DIAGNOSIS — R072 Precordial pain: Secondary | ICD-10-CM

## 2012-08-16 ENCOUNTER — Other Ambulatory Visit: Payer: Self-pay | Admitting: Physician Assistant

## 2012-08-29 ENCOUNTER — Encounter: Payer: Self-pay | Admitting: Cardiology

## 2012-09-07 HISTORY — PX: UPPER GASTROINTESTINAL ENDOSCOPY: SHX188

## 2012-09-08 ENCOUNTER — Encounter: Payer: Self-pay | Admitting: Cardiology

## 2012-09-08 ENCOUNTER — Ambulatory Visit (INDEPENDENT_AMBULATORY_CARE_PROVIDER_SITE_OTHER): Payer: PRIVATE HEALTH INSURANCE | Admitting: Cardiology

## 2012-09-08 VITALS — BP 128/70 | HR 83 | Ht 60.0 in | Wt 164.0 lb

## 2012-09-08 DIAGNOSIS — F419 Anxiety disorder, unspecified: Secondary | ICD-10-CM

## 2012-09-08 DIAGNOSIS — F411 Generalized anxiety disorder: Secondary | ICD-10-CM

## 2012-09-08 DIAGNOSIS — Z8679 Personal history of other diseases of the circulatory system: Secondary | ICD-10-CM

## 2012-09-08 DIAGNOSIS — M26629 Arthralgia of temporomandibular joint, unspecified side: Secondary | ICD-10-CM

## 2012-09-08 DIAGNOSIS — K219 Gastro-esophageal reflux disease without esophagitis: Secondary | ICD-10-CM

## 2012-09-08 DIAGNOSIS — I2510 Atherosclerotic heart disease of native coronary artery without angina pectoris: Secondary | ICD-10-CM

## 2012-09-08 NOTE — Assessment & Plan Note (Signed)
Her bilateral jaw pain and neck pain has resolved since she was placed on antibiotics for her abscess tooth.

## 2012-09-08 NOTE — Patient Instructions (Addendum)
Your physician recommends that you continue on your current medications as directed. Please refer to the Current Medication list given to you today.  Keep your February appointment

## 2012-09-08 NOTE — Assessment & Plan Note (Signed)
Her symptoms of reflux are improved on current therapy

## 2012-09-08 NOTE — Assessment & Plan Note (Signed)
The patient has chronic anxiety which appears to be slightly improved since last visit.

## 2012-09-08 NOTE — Progress Notes (Signed)
Jenna Hernandez Jenna Man Date of Birth:  Sep 01, 1951 [facility] [address] [place], [place]  [address] [phone]  Fax   [phone]  HPI: This pleasant 61 year old woman is seen for a scheduled  followup office visit. She has a history of a non-STEMI on 12/13/11. She underwent cardiac catheterization which showed smooth and normal coronary arteries it was felt that she had a myocardial infarction secondary to possible transient spasm with thrombosis since she did have some hypokinesis in the mid inferior wall the overall ejection fraction was 65%. She was readmitted to the hospital on 12/26/11 with chest pain and was discharged the following day after ruling out.  During her readmission her initial cardiac catheter films were reviewed by the admitting physician Dr. Kirke Corin who felt that she might have had a small branch occlusion of off the right coronary artery to explain her wall motion abnormalities. Patient has been doing well with no recurrent chest pain. She has had some bilateral jaw pain and recently saw her dentist who diagnosed a problem with some bone loss. The patient admits to being under a lot of stress. She has stress at home and at work. Her work consists of sitting at a [employer] all day and she is not getting any regular exercise.  Since last visit she has been diagnosed with possible abscess under a Tooth and has seen and endodontist and is presently on an antibiotic.   Current Outpatient Prescriptions  Medication Sig Dispense Refill  . acetaminophen (TYLENOL) 500 MG tablet Take 1,000 mg by mouth every 6 (six) hours as needed.      . ALPRAZolam (XANAX) 0.25 MG tablet Take 0.25 mg by mouth at bedtime as needed.      Marland Kitchen aspirin 81 MG tablet Take 81 mg by mouth daily.      . clopidogrel (PLAVIX) 75 MG tablet TAKE ONE TABLET BY MOUTH IN THE MORNING WITH BREAKFAST  30 tablet  5  . isosorbide mononitrate (IMDUR) 30 MG 24 hr tablet TAKE ONE TABLET BY MOUTH  EVERY DAY  30 tablet  5  . losartan (COZAAR) 25 MG tablet Take 1 tablet (25 mg total) by mouth daily.  30 tablet  5  . metoprolol tartrate (LOPRESSOR) 25 MG tablet Take 0.5 tablets (12.5 mg total) by mouth 2 (two) times daily.  60 tablet  6  . nitroGLYCERIN (NITROSTAT) 0.4 MG SL tablet Place 1 tablet (0.4 mg total) under the tongue every 5 (five) minutes x 3 doses as needed for chest pain.  25 tablet  4  . pantoprazole (PROTONIX) 40 MG tablet Take 40 mg by mouth daily.      . pravastatin (PRAVACHOL) 20 MG tablet TAKE ONE TABLET BY MOUTH EVERY DAY  30 tablet  5  . ranitidine (ZANTAC) 150 MG tablet Take 1 tablet (150 mg total) by mouth 2 (two) times daily as needed for heartburn.        Allergies  Allergen Reactions  . Diclofenac Sodium Anaphylaxis    Tolerates aspirin  . Meperidine-Promethazine     headache  . Ramipril     cough  . Sulfa Antibiotics   . Codeine Rash    Patient Active Problem List  Diagnosis  . NSTEMI (non-ST elevated myocardial infarction)  . Anxiety  . CAD (coronary artery disease)  . GERD (gastroesophageal reflux disease)  . Cough due to ACE inhibitor    History  Smoking status  . Former Smoker -- 0.5 packs/day  . Types:  Cigarettes  Smokeless tobacco  . Not on file    History  Alcohol Use No    Family History  Problem Relation Age of Onset  . Coronary artery disease Father 33    Review of Systems: The patient denies any heat or cold intolerance.  No weight gain or weight loss.  The patient denies headaches or blurry vision.  There is no cough or sputum production.  The patient denies dizziness.  There is no hematuria or hematochezia.  The patient denies any muscle aches or arthritis.  The patient denies any rash.  The patient denies frequent falling or instability.  There is no history of depression or anxiety.  All other systems were reviewed and are negative.   Physical Exam: Filed Vitals:   09/08/12 1444  BP: 128/70  Pulse: 83   general  appearance reveals a well-developed well-nourished woman in no distress.The head and neck exam reveals pupils equal and reactive.  Extraocular movements are full.  There is no scleral icterus.  The mouth and pharynx are normal.  The neck is supple.  The carotids reveal no bruits.  The jugular venous pressure is normal.  The  thyroid is not enlarged.  There is no lymphadenopathy.  The chest is clear to percussion and auscultation.  There are no rales or rhonchi.  Expansion of the chest is symmetrical.  The precordium is quiet.  The first heart sound is normal.  The second heart sound is physiologically split.  There is no murmur gallop rub or click.  There is no abnormal lift or heave.  The abdomen is soft and nontender.  The bowel sounds are normal.  The liver and spleen are not enlarged.  There are no abdominal masses.  There are no abdominal bruits.  Extremities reveal good pedal pulses.  There is no phlebitis or edema.  There is no cyanosis or clubbing.  Strength is normal and symmetrical in all extremities.  There is no lateralizing weakness.  There are no sensory deficits.  The skin is warm and dry.  There is no rash.  EKG shows normal sinus rhythm and pattern of possible old inferior wall myocardial infarction and no ischemic change.  Since 04/04/12, no significant change   Assessment / Plan: Continue current therapy.  Recheck in about 3 months for followup office visit.  Her jaw symptoms appear to be in related to problems with abscess under one of her teeth

## 2012-10-27 ENCOUNTER — Other Ambulatory Visit: Payer: Self-pay

## 2012-10-27 MED ORDER — LOSARTAN POTASSIUM 25 MG PO TABS: 25.0000 mg | ORAL_TABLET | Freq: Every day | ORAL | Status: DC

## 2012-11-04 ENCOUNTER — Telehealth: Payer: Self-pay | Admitting: Cardiology

## 2012-11-04 NOTE — Telephone Encounter (Signed)
Will forward to  Dr. Brackbill for review 

## 2012-11-04 NOTE — Telephone Encounter (Signed)
New Problem:    Patient called in wanting to know if Tamaflu was ok to take with her other heart medications.  Patient was prescribed this medication by her PCP.  Please call back

## 2012-11-04 NOTE — Telephone Encounter (Signed)
Will forward to Melinda. 

## 2012-11-04 NOTE — Telephone Encounter (Signed)
Yes okay to take Tamiflu

## 2012-11-04 NOTE — Telephone Encounter (Signed)
Advised ok  

## 2012-11-19 DIAGNOSIS — R079 Chest pain, unspecified: Secondary | ICD-10-CM

## 2012-11-28 ENCOUNTER — Ambulatory Visit (INDEPENDENT_AMBULATORY_CARE_PROVIDER_SITE_OTHER): Payer: PRIVATE HEALTH INSURANCE | Admitting: Cardiology

## 2012-11-28 ENCOUNTER — Encounter: Payer: Self-pay | Admitting: Cardiology

## 2012-11-28 VITALS — BP 124/76 | HR 69 | Ht 63.0 in | Wt 163.0 lb

## 2012-11-28 DIAGNOSIS — I2510 Atherosclerotic heart disease of native coronary artery without angina pectoris: Secondary | ICD-10-CM

## 2012-11-28 DIAGNOSIS — M545 Low back pain, unspecified: Secondary | ICD-10-CM

## 2012-11-28 DIAGNOSIS — K219 Gastro-esophageal reflux disease without esophagitis: Secondary | ICD-10-CM

## 2012-11-28 NOTE — Progress Notes (Signed)
Jenna Hernandez [name] Date of Birth:  May 18, 1951 [facility] [address] [place], [place]  [address] [phone]  Fax   [phone]  HPI: This pleasant 62 year old woman is seen for a post hospital followup office visit.  One week ago she was admitted overnight for observation at [facility] for chest pain and she ruled out.  Her EKG and enzymes and chest x-ray were normal and she was discharged the next day. She has a history of a non-STEMI on 12/13/11. She underwent cardiac catheterization which showed smooth and normal coronary arteries it was felt that she had a myocardial infarction secondary to possible transient spasm with thrombosis since she did have some hypokinesis in the mid inferior wall the overall ejection fraction was 65%. She was readmitted to the hospital on 12/26/11 with chest pain and was discharged the following day after ruling out. During her readmission her initial cardiac catheter films were reviewed by the admitting physician Dr. Kirke Corin who felt that she might have had a small branch occlusion of off the right coronary artery to explain her wall motion abnormalities. Patient has been doing well with no recurrent chest pain. She has had some bilateral jaw pain and recently saw her dentist who diagnosed a problem with some bone loss. The patient admits to being under a lot of stress. She has stress at home and at work. Her work consists of sitting at a [employer] all day and she is not getting any regular exercise.    Current Outpatient Prescriptions  Medication Sig Dispense Refill  . acetaminophen (TYLENOL) 500 MG tablet Take 1,000 mg by mouth every 6 (six) hours as needed.      . ALPRAZolam (XANAX) 0.25 MG tablet Take 0.25 mg by mouth at bedtime as needed.      Marland Kitchen aspirin 81 MG tablet Take 81 mg by mouth daily.      . clopidogrel (PLAVIX) 75 MG tablet TAKE ONE TABLET BY MOUTH IN THE MORNING WITH BREAKFAST  30 tablet  5  . isosorbide  mononitrate (IMDUR) 30 MG 24 hr tablet TAKE ONE TABLET BY MOUTH EVERY DAY  30 tablet  5  . losartan (COZAAR) 25 MG tablet Take 1 tablet (25 mg total) by mouth daily.  30 tablet  5  . metoprolol tartrate (LOPRESSOR) 25 MG tablet Take 0.5 tablets (12.5 mg total) by mouth 2 (two) times daily.  60 tablet  6  . nitroGLYCERIN (NITROSTAT) 0.4 MG SL tablet Place 1 tablet (0.4 mg total) under the tongue every 5 (five) minutes x 3 doses as needed for chest pain.  25 tablet  4  . pantoprazole (PROTONIX) 40 MG tablet Take 40 mg by mouth daily.      . pravastatin (PRAVACHOL) 20 MG tablet TAKE ONE TABLET BY MOUTH EVERY DAY  30 tablet  5  . ranitidine (ZANTAC) 150 MG tablet Take 1 tablet (150 mg total) by mouth 2 (two) times daily as needed for heartburn.       No current facility-administered medications for this visit.    Allergies  Allergen Reactions  . Diclofenac Sodium Anaphylaxis    Tolerates aspirin  . Meperidine-Promethazine     headache  . Ramipril     cough  . Sulfa Antibiotics   . Codeine Rash    Patient Active Problem List  Diagnosis  . NSTEMI (non-ST elevated myocardial infarction)  . Anxiety  . CAD (coronary artery disease)  . GERD (gastroesophageal reflux disease)  . Cough  due to ACE inhibitor    History  Smoking status  . Former Smoker -- 0.50 packs/day  . Types: Cigarettes  Smokeless tobacco  . Not on file    History  Alcohol Use No    Family History  Problem Relation Age of Onset  . Coronary artery disease Father 22    Review of Systems: The patient denies any heat or cold intolerance.  No weight gain or weight loss.  The patient denies headaches or blurry vision.  There is no cough or sputum production.  The patient denies dizziness.  There is no hematuria or hematochezia.  The patient denies any muscle aches or arthritis.  The patient denies any rash.  The patient denies frequent falling or instability.  There is no history of depression or anxiety.  All other  systems were reviewed and are negative.   Physical Exam: Filed Vitals:   11/28/12 1403  BP: 124/76  Pulse: 69   general appearance reveals a well-developed well-nourished woman in no distress.The head and neck exam reveals pupils equal and reactive.  Extraocular movements are full.  There is no scleral icterus.  The mouth and pharynx are normal.  The neck is supple.  The carotids reveal no bruits.  The jugular venous pressure is normal.  The  thyroid is not enlarged.  There is no lymphadenopathy.  The chest is clear to percussion and auscultation.  There are no rales or rhonchi.  Expansion of the chest is symmetrical.  The precordium is quiet.  The first heart sound is normal.  The second heart sound is physiologically split.  There is no murmur gallop rub or click.  There is no abnormal lift or heave.  The abdomen is soft and nontender.  The bowel sounds are normal.  The liver and spleen are not enlarged.  There are no abdominal masses.  There are no abdominal bruits.  Extremities reveal good pedal pulses.  There is no phlebitis or edema.  There is no cyanosis or clubbing.  Strength is normal and symmetrical in all extremities.  There is no lateralizing weakness.  There are no sensory deficits.  The skin is warm and dry.  There is no rash.      Assessment / Plan: Continue same medication.  Recheck here in 4 months for followup office visit

## 2012-11-28 NOTE — Assessment & Plan Note (Signed)
The patient has had no further chest pain since her recent hospital admission.  We talked about the fact that it was reassuring that her EKG during pain was still normal and that the cardiac enzymes remained normal.  Her cardiac catheterization showing essentially normal coronary arteries was less than a year ago.

## 2012-11-28 NOTE — Assessment & Plan Note (Signed)
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  She is considering applying for disability on that basis.

## 2012-11-28 NOTE — Assessment & Plan Note (Signed)
The patient has not had any new symptoms to suggest GERD exacerbation

## 2012-11-28 NOTE — Patient Instructions (Addendum)
Your physician recommends that you continue on your current medications as directed. Please refer to the Current Medication list given to you today.  Your physician recommends that you schedule a follow-up appointment in: 4 month ov  

## 2013-01-18 ENCOUNTER — Other Ambulatory Visit: Payer: Self-pay | Admitting: Physician Assistant

## 2013-01-19 ENCOUNTER — Telehealth: Payer: Self-pay | Admitting: Cardiology

## 2013-01-19 MED ORDER — METOPROLOL TARTRATE 25 MG PO TABS: ORAL_TABLET | ORAL | Status: DC

## 2013-01-19 MED ORDER — CLOPIDOGREL BISULFATE 75 MG PO TABS: ORAL_TABLET | ORAL | Status: DC

## 2013-01-19 NOTE — Telephone Encounter (Signed)
Refill Request   Metoprolol 25 mg Clopidegrel 75 mg

## 2013-01-30 ENCOUNTER — Other Ambulatory Visit: Payer: Self-pay | Admitting: *Deleted

## 2013-01-30 ENCOUNTER — Telehealth: Payer: Self-pay | Admitting: Cardiology

## 2013-01-30 MED ORDER — ISOSORBIDE MONONITRATE ER 30 MG PO TB24: ORAL_TABLET | ORAL | Status: DC

## 2013-01-30 NOTE — Telephone Encounter (Signed)
New Prob    Pt has some concerned regarding ear and neck pain. Would like to speak to nurse regarding this.

## 2013-01-30 NOTE — Telephone Encounter (Signed)
Patient has been having some problems with shooting pains behind ears and down neck. Also has jaw pain, but has been told by dentist that she has bone loss. Does use NTG for this and seems to help. Patient is very concerned since she did not have chest pains with her previous heart attack (jaw and neck pain).  Patient does not want to go back to ED since she has been so frequently (work at [employer]). Did schedule appointment with Dawayne Patricia for Monday am, patient states will go to ED if worse.

## 2013-02-02 ENCOUNTER — Encounter: Payer: Self-pay | Admitting: Nurse Practitioner

## 2013-02-02 ENCOUNTER — Ambulatory Visit (INDEPENDENT_AMBULATORY_CARE_PROVIDER_SITE_OTHER): Payer: PRIVATE HEALTH INSURANCE | Admitting: Nurse Practitioner

## 2013-02-02 VITALS — BP 160/90 | HR 60 | Ht 63.0 in | Wt 167.8 lb

## 2013-02-02 DIAGNOSIS — I2510 Atherosclerotic heart disease of native coronary artery without angina pectoris: Secondary | ICD-10-CM

## 2013-02-02 MED ORDER — AMLODIPINE BESYLATE 5 MG PO TABS: 5.0000 mg | ORAL_TABLET | Freq: Every day | ORAL | Status: DC

## 2013-02-02 NOTE — Progress Notes (Signed)
Yates Decamp [name] Date of Birth: [birth year] Medical Record #[id number]  History of Present Illness: Jenna Hernandez is seen back today for a work in visit. She is seen for Dr. Patty Sermons. She has known CAD with NSTEMI in march of 2013. There was question as to whether this was the result of vasospasm however, after review by Dr. Kirke Corin, there may be a small branch occlusion off the RCA. Negative stress echo in May of 2013. Readmitted in February at [facility] with chest pain. Other issues include situational stress, fibromyalgia, OA, anxiety, GERD and ACE related cough. She is a former smoker. She is on chronic Plavix therapy.   She comes in today. She is here alone. She has bilateral neck pain. Has been going on for several weeks. Sometimes constant, sometimes intermittent. Not really exertional but she endorses DOE. Has tried some sl NTG with relief last week. Worried this is carotid disease. No actual chest pain. Has fibromyalgia, TMJ with bone loss and seems to just ache all over. More stress. Not exercising. Says she can't afford a gym. BP is elevated. Not checking at home.  Has gained 20 pounds.   Current Outpatient Prescriptions on File Prior to Visit  Medication Sig Dispense Refill  . acetaminophen (TYLENOL) 500 MG tablet Take 1,000 mg by mouth every 6 (six) hours as needed.      . ALPRAZolam (XANAX) 0.25 MG tablet Take 0.25 mg by mouth at bedtime as needed.      Marland Kitchen aspirin 81 MG tablet Take 81 mg by mouth daily.      . clopidogrel (PLAVIX) 75 MG tablet TAKE ONE TABLET BY MOUTH IN THE MORNING WITH BREAKFAST  30 tablet  5  . isosorbide mononitrate (IMDUR) 30 MG 24 hr tablet TAKE ONE TABLET BY MOUTH EVERY DAY  30 tablet  5  . losartan (COZAAR) 25 MG tablet Take 1 tablet (25 mg total) by mouth daily.  30 tablet  5  . metoprolol tartrate (LOPRESSOR) 25 MG tablet TAKE ONE-HALF TABLET BY MOUTH TWICE DAILY  30 tablet  5  . pantoprazole (PROTONIX) 40 MG tablet Take 40 mg by mouth 2 (two) times daily.        . pravastatin (PRAVACHOL) 20 MG tablet TAKE ONE TABLET BY MOUTH EVERY DAY  30 tablet  5  . ranitidine (ZANTAC) 150 MG tablet Take 1 tablet (150 mg total) by mouth 2 (two) times daily as needed for heartburn.       No current facility-administered medications on file prior to visit.    Allergies  Allergen Reactions  . Diclofenac Sodium Anaphylaxis    Tolerates aspirin  . Meperidine-Promethazine     headache  . Ramipril     cough  . Sulfa Antibiotics   . Codeine Rash    Past Medical History  Diagnosis Date  . GERD (gastroesophageal reflux disease)   . Fibromyalgia   . NSTEMI (non-ST elevated myocardial infarction)     Smooth and normal coronaries by cath 12/13/11, felt possibly secondary to transient thrombosis with resolution of thrombus  (troponin of ~7). EF 65% with small area of severe hypokinesis in mid inferior wall    Past Surgical History  Procedure Laterality Date  . Cholecystectomy    . Total abdominal hysterectomy    . Inguinal hernia repair      History  Smoking status  . Former Smoker -- 0.50 packs/day  . Types: Cigarettes  Smokeless tobacco  . Not on file    History  Alcohol  Use No    Family History  Problem Relation Age of Onset  . Coronary artery disease Father 53    Review of Systems: The review of systems is per the HPI.  All other systems were reviewed and are negative.  Physical Exam: BP 160/90  Pulse 60  Ht 5\' 3"  (1.6 m)  Wt 167 lb 12.8 oz (76.114 kg)  BMI 29.73 kg/m2 Patient is very pleasant and in no acute distress. Skin is warm and dry. Color is normal.  HEENT is unremarkable. Normocephalic/atraumatic. PERRL. Sclera are nonicteric. Neck is supple. No masses. No JVD. Lungs are clear. Cardiac exam shows a regular rate and rhythm. Abdomen is soft. Extremities are without edema. Gait and ROM are intact. No gross neurologic deficits noted.  LABORATORY DATA: n/a  Lab Results  Component Value Date   WBC 6.9 12/14/2011   HGB 12.3  12/14/2011   HCT 36.7 12/14/2011   PLT 221 12/14/2011   GLUCOSE 96 12/13/2011   CHOL 159 12/13/2011   TRIG 116 12/13/2011   HDL 62 12/13/2011   LDLCALC 74 12/13/2011   ALT 13 12/13/2011   AST 36 12/13/2011   NA 143 12/13/2011   K 3.8 12/13/2011   CL 105 12/13/2011   CREATININE 0.82 12/13/2011   BUN 13 12/13/2011   CO2 32 12/13/2011   TSH 3.714 12/13/2011   INR 1.03 12/13/2011    Assessment / Plan: 1. Bilateral neck pain - questionable etiology - includes cardiac, musculoskeletal, fibromyalgia, stress, etc. BP is up. I am adding Norvasc 5 mg - this will help with her BP and if its vasospasm that we are dealing with. See Dr. Patty Sermons back in 3 weeks. I do not think we need a carotid doppler at this time. No bruits noted on exam and no neuro symptoms reported.   2. Prior NSTEMI in March 2013 - basically normal coronaries with normal EF - possible occlusion off the RCA - managed medically.   3. Anxiety   4. HTN - BP is up today. I have added Norvasc 5 mg a day.   5. DOE - suspect this is due to deconditioning and weight. Needs regular exercise.   Patient is agreeable to this plan and will call if any problems develop in the interim.   Rosalio Macadamia, RN, ANP-C [facility] [address] [place], [place]  [address]

## 2013-02-02 NOTE — Patient Instructions (Addendum)
I am adding Norvasc (amlodipine) 5 mg to take one each day  Try to check some blood pressures at home and keep a record  See Dr. Patty Sermons in 3 weeks  Walking every day  Call the [facility] office at [phone] if you have any questions, problems or concerns.

## 2013-02-06 ENCOUNTER — Ambulatory Visit: Payer: Self-pay | Admitting: Cardiology

## 2013-02-19 ENCOUNTER — Ambulatory Visit: Payer: Self-pay | Admitting: Cardiology

## 2013-03-17 ENCOUNTER — Ambulatory Visit (INDEPENDENT_AMBULATORY_CARE_PROVIDER_SITE_OTHER): Payer: PRIVATE HEALTH INSURANCE | Admitting: Cardiology

## 2013-03-17 ENCOUNTER — Encounter: Payer: Self-pay | Admitting: Cardiology

## 2013-03-17 VITALS — BP 116/70 | HR 76 | Ht 63.0 in | Wt 170.0 lb

## 2013-03-17 DIAGNOSIS — I2510 Atherosclerotic heart disease of native coronary artery without angina pectoris: Secondary | ICD-10-CM

## 2013-03-17 DIAGNOSIS — I259 Chronic ischemic heart disease, unspecified: Secondary | ICD-10-CM

## 2013-03-17 DIAGNOSIS — M1990 Unspecified osteoarthritis, unspecified site: Secondary | ICD-10-CM | POA: Insufficient documentation

## 2013-03-17 DIAGNOSIS — IMO0001 Reserved for inherently not codable concepts without codable children: Secondary | ICD-10-CM

## 2013-03-17 DIAGNOSIS — M797 Fibromyalgia: Secondary | ICD-10-CM

## 2013-03-17 DIAGNOSIS — F419 Anxiety disorder, unspecified: Secondary | ICD-10-CM

## 2013-03-17 DIAGNOSIS — F411 Generalized anxiety disorder: Secondary | ICD-10-CM

## 2013-03-17 NOTE — Progress Notes (Signed)
Jenna Hernandez Jenna Hernandez Date of Birth:  1951/06/06 [facility] [address] [place], [place]  [address] [phone]  Fax   [phone]  HPI: This pleasant 62 year old woman is seen for a scheduled followup office visit.  Several months ago she was admitted overnight for observation at [facility] for chest pain and she ruled out. Her EKG and enzymes and chest x-ray were normal and she was discharged the next day. She has a history of a non-STEMI on 12/13/11. She underwent cardiac catheterization which showed smooth and normal coronary arteries it was felt that she had a myocardial infarction secondary to possible transient spasm with thrombosis since she did have some hypokinesis in the mid inferior wall the overall ejection fraction was 65%. She was readmitted to the hospital on 12/26/11 with chest pain and was discharged the following day after ruling out. During her readmission her initial cardiac catheter films were reviewed by the admitting physician Dr. Kirke Corin who felt that she might have had a small branch occlusion of off the right coronary artery to explain her wall motion abnormalities. Patient has been doing well with no recurrent chest pain. She has had some bilateral jaw pain and recently saw her dentist who diagnosed a problem with some bone loss. The patient admits to being under a lot of stress. She has stress at home and at work. Her work consists of sitting at a [employer] all day and she is not getting any regular exercise.    Current Outpatient Prescriptions  Medication Sig Dispense Refill  . acetaminophen (TYLENOL) 500 MG tablet Take 1,000 mg by mouth every 6 (six) hours as needed.      . ALPRAZolam (XANAX) 0.25 MG tablet Take 0.25 mg by mouth at bedtime as needed.      Marland Kitchen amLODipine (NORVASC) 5 MG tablet Take 1 tablet (5 mg total) by mouth daily.  30 tablet  6  . aspirin 81 MG tablet Take 81 mg by mouth daily.      . clopidogrel (PLAVIX) 75 MG  tablet TAKE ONE TABLET BY MOUTH IN THE MORNING WITH BREAKFAST  30 tablet  5  . isosorbide mononitrate (IMDUR) 30 MG 24 hr tablet TAKE ONE TABLET BY MOUTH EVERY DAY  30 tablet  5  . losartan (COZAAR) 25 MG tablet Take 1 tablet (25 mg total) by mouth daily.  30 tablet  5  . metoprolol tartrate (LOPRESSOR) 25 MG tablet TAKE ONE-HALF TABLET BY MOUTH TWICE DAILY  30 tablet  5  . pantoprazole (PROTONIX) 40 MG tablet Take 40 mg by mouth 2 (two) times daily.       . pravastatin (PRAVACHOL) 20 MG tablet TAKE ONE TABLET BY MOUTH EVERY DAY  30 tablet  5  . ranitidine (ZANTAC) 150 MG tablet Take 1 tablet (150 mg total) by mouth 2 (two) times daily as needed for heartburn.       No current facility-administered medications for this visit.    Allergies  Allergen Reactions  . Diclofenac Sodium Anaphylaxis    Tolerates aspirin  . Meperidine-Promethazine     headache  . Ramipril     cough  . Sulfa Antibiotics   . Codeine Rash    Patient Active Problem List   Diagnosis Date Noted  . Low back pain 11/28/2012  . Cough due to ACE inhibitor 04/04/2012  . Anxiety 01/01/2012  . CAD (coronary artery disease) 01/01/2012  . GERD (gastroesophageal reflux disease) 01/01/2012  . NSTEMI (non-ST elevated myocardial  infarction) 12/13/2011    History  Smoking status  . Former Smoker -- 0.50 packs/day  . Types: Cigarettes  Smokeless tobacco  . Not on file    History  Alcohol Use No    Family History  Problem Relation Age of Onset  . Coronary artery disease Father 50    Review of Systems: The patient denies any heat or cold intolerance.  No weight gain or weight loss.  The patient denies headaches or blurry vision.  There is no cough or sputum production.  The patient denies dizziness.  There is no hematuria or hematochezia.  The patient denies any muscle aches or arthritis.  The patient denies any rash.  The patient denies frequent falling or instability.  There is no history of depression or  anxiety.  All other systems were reviewed and are negative.   Physical Exam: Filed Vitals:   03/17/13 1322  BP: 116/70  Pulse: 76   the general appearance reveals a well-developed well-nourished woman in no distress.The head and neck exam reveals pupils equal and reactive.  Extraocular movements are full.  There is no scleral icterus.  The mouth and pharynx are normal.  The neck is supple.  The carotids reveal no bruits.  The jugular venous pressure is normal.  The  thyroid is not enlarged.  There is no lymphadenopathy.  The chest is clear to percussion and auscultation.  There are no rales or rhonchi.  Expansion of the chest is symmetrical.  The precordium is quiet.  The first heart sound is normal.  The second heart sound is physiologically split.  There is no murmur gallop rub or click.  There is no abnormal lift or heave.  The abdomen is soft and nontender.  The bowel sounds are normal.  The liver and spleen are not enlarged.  There are no abdominal masses.  There are no abdominal bruits.  Extremities reveal good pedal pulses.  There is no phlebitis or edema.  There is no cyanosis or clubbing.  Strength is normal and symmetrical in all extremities.  There is no lateralizing weakness.  There are no sensory deficits.  The skin is warm and dry.  There is no rash.      Assessment / Plan: Continue same medication.  Recheck in 4 months for office visit and EKG.  I have encouraged her to try to remain in the work force as long as possible.

## 2013-03-17 NOTE — Assessment & Plan Note (Signed)
The patient has not been experiencing any severe substernal discomfort to suggest recurrent angina.

## 2013-03-17 NOTE — Assessment & Plan Note (Signed)
The patient has been diagnosed by Dr. Charlett Blake as having widespread osteoarthritis involving multiple joints including her neck back shoulders and she also has TMJ syndrome.  The patient had been given Flexeril and hydrocodone but has not started it yet.  The patient also has a history of fibromyalgia.

## 2013-03-17 NOTE — Patient Instructions (Addendum)
Your physician recommends that you continue on your current medications as directed. Please refer to the Current Medication list given to you today.  Your physician wants you to follow-up in: 4 month ov/ekg  You will receive a reminder letter in the mail two months in advance. If you don't receive a letter, please call our office to schedule the follow-up appointment.  

## 2013-03-17 NOTE — Assessment & Plan Note (Signed)
The patient continues to be very anxious.  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

## 2013-03-23 ENCOUNTER — Other Ambulatory Visit: Payer: Self-pay | Admitting: *Deleted

## 2013-03-23 MED ORDER — PRAVASTATIN SODIUM 20 MG PO TABS: ORAL_TABLET | ORAL | Status: DC

## 2013-03-25 ENCOUNTER — Ambulatory Visit: Payer: Self-pay | Admitting: Cardiology

## 2013-04-21 ENCOUNTER — Other Ambulatory Visit: Payer: Self-pay | Admitting: *Deleted

## 2013-04-21 MED ORDER — LOSARTAN POTASSIUM 25 MG PO TABS: 25.0000 mg | ORAL_TABLET | Freq: Every day | ORAL | Status: DC

## 2013-05-05 DIAGNOSIS — R6884 Jaw pain: Secondary | ICD-10-CM

## 2013-07-21 ENCOUNTER — Observation Stay (HOSPITAL_COMMUNITY)
Admission: AD | Admit: 2013-07-21 | Discharge: 2013-07-23 | Disposition: A | Discharge status: Home / Self Care | Payer: PRIVATE HEALTH INSURANCE | Source: Other Acute Inpatient Hospital | Attending: Cardiology | Admitting: Cardiology

## 2013-07-21 ENCOUNTER — Telehealth: Payer: Self-pay | Admitting: Cardiology

## 2013-07-21 ENCOUNTER — Encounter (HOSPITAL_COMMUNITY): Payer: Self-pay | Admitting: Physician Assistant

## 2013-07-21 DIAGNOSIS — M797 Fibromyalgia: Secondary | ICD-10-CM

## 2013-07-21 DIAGNOSIS — Z87891 Personal history of nicotine dependence: Secondary | ICD-10-CM | POA: Insufficient documentation

## 2013-07-21 DIAGNOSIS — F419 Anxiety disorder, unspecified: Secondary | ICD-10-CM

## 2013-07-21 DIAGNOSIS — I10 Essential (primary) hypertension: Secondary | ICD-10-CM | POA: Insufficient documentation

## 2013-07-21 DIAGNOSIS — Z79899 Other long term (current) drug therapy: Secondary | ICD-10-CM | POA: Insufficient documentation

## 2013-07-21 DIAGNOSIS — F411 Generalized anxiety disorder: Secondary | ICD-10-CM | POA: Insufficient documentation

## 2013-07-21 DIAGNOSIS — I2510 Atherosclerotic heart disease of native coronary artery without angina pectoris: Secondary | ICD-10-CM | POA: Insufficient documentation

## 2013-07-21 DIAGNOSIS — I214 Non-ST elevation (NSTEMI) myocardial infarction: Secondary | ICD-10-CM | POA: Insufficient documentation

## 2013-07-21 DIAGNOSIS — G8929 Other chronic pain: Secondary | ICD-10-CM | POA: Insufficient documentation

## 2013-07-21 DIAGNOSIS — IMO0001 Reserved for inherently not codable concepts without codable children: Secondary | ICD-10-CM | POA: Insufficient documentation

## 2013-07-21 DIAGNOSIS — K219 Gastro-esophageal reflux disease without esophagitis: Secondary | ICD-10-CM | POA: Insufficient documentation

## 2013-07-21 DIAGNOSIS — M542 Cervicalgia: Principal | ICD-10-CM | POA: Insufficient documentation

## 2013-07-21 DIAGNOSIS — M1990 Unspecified osteoarthritis, unspecified site: Secondary | ICD-10-CM | POA: Insufficient documentation

## 2013-07-21 DIAGNOSIS — Z9889 Other specified postprocedural states: Secondary | ICD-10-CM | POA: Insufficient documentation

## 2013-07-21 HISTORY — DX: Anxiety disorder, unspecified: F41.9

## 2013-07-21 HISTORY — DX: Personal history of other diseases of the digestive system: Z87.19

## 2013-07-21 HISTORY — DX: Dorsalgia, unspecified: M54.9

## 2013-07-21 HISTORY — DX: Other chronic pain: G89.29

## 2013-07-21 HISTORY — DX: Unspecified osteoarthritis, unspecified site: M19.90

## 2013-07-21 LAB — MRSA PCR SCREENING: MRSA by PCR: NEGATIVE

## 2013-07-21 LAB — TROPONIN I
Troponin I: <0.3 ng/mL (ref <0.30)
Troponin I: <0.3 ng/mL (ref <0.30)

## 2013-07-21 LAB — CBC
HCT: 35.7 % — ABNORMAL LOW (ref 36.0–46.0)
Hemoglobin: 12.2 g/dL (ref 12.0–15.0)
MCH: 30.9 pg (ref 26.0–34.0)
MCHC: 34.2 g/dL (ref 30.0–36.0)
MCV: 90.4 fL (ref 78.0–100.0)
Platelets: 222 K/uL (ref 150–400)
RBC: 3.95 MIL/uL (ref 3.87–5.11)
RDW: 12.6 % (ref 11.5–15.5)
WBC: 5.2 K/uL (ref 4.0–10.5)

## 2013-07-21 LAB — HEMOGLOBIN A1C: Hgb A1c MFr Bld: 5.7 % — ABNORMAL HIGH (ref <5.7)

## 2013-07-21 LAB — CREATININE, SERUM
Creatinine, Ser: 0.79 mg/dL (ref 0.50–1.10)
GFR calc Af Amer: >90 mL/min (ref >90)
GFR calc non Af Amer: 87 mL/min — ABNORMAL LOW (ref >90)

## 2013-07-21 MED ORDER — SIMVASTATIN 10 MG PO TABS
10.0000 mg | ORAL_TABLET | Freq: Every day | ORAL | Status: DC
  Administered 2013-07-21 – 2013-07-22 (×2): 10 mg via ORAL
  Filled 2013-07-21 (×4): qty 1

## 2013-07-21 MED ORDER — PANTOPRAZOLE SODIUM 40 MG PO TBEC
40.0000 mg | DELAYED_RELEASE_TABLET | Freq: Two times a day (BID) | ORAL | Status: DC
  Administered 2013-07-21 – 2013-07-23 (×4): 40 mg via ORAL
  Filled 2013-07-21 (×4): qty 1

## 2013-07-21 MED ORDER — LOSARTAN POTASSIUM 25 MG PO TABS
25.0000 mg | ORAL_TABLET | Freq: Every day | ORAL | Status: DC
  Filled 2013-07-21: qty 1

## 2013-07-21 MED ORDER — ACETAMINOPHEN 500 MG PO TABS
1000.0000 mg | ORAL_TABLET | Freq: Four times a day (QID) | ORAL | Status: DC | PRN
  Administered 2013-07-22 (×2): 1000 mg via ORAL
  Filled 2013-07-21 (×2): qty 2

## 2013-07-21 MED ORDER — AMLODIPINE BESYLATE 5 MG PO TABS
5.0000 mg | ORAL_TABLET | Freq: Every day | ORAL | Status: DC
  Filled 2013-07-21: qty 1

## 2013-07-21 MED ORDER — FAMOTIDINE 20 MG PO TABS
20.0000 mg | ORAL_TABLET | Freq: Two times a day (BID) | ORAL | Status: DC | PRN
  Filled 2013-07-21: qty 1

## 2013-07-21 MED ORDER — SODIUM CHLORIDE 0.9 % IV SOLN: 250.0000 mL | INTRAVENOUS | Status: DC | PRN

## 2013-07-21 MED ORDER — ALPRAZOLAM 0.25 MG PO TABS: 0.2500 mg | ORAL_TABLET | Freq: Every evening | ORAL | Status: DC | PRN

## 2013-07-21 MED ORDER — HEPARIN SODIUM (PORCINE) 5000 UNIT/ML IJ SOLN
5000.0000 [IU] | Freq: Three times a day (TID) | INTRAMUSCULAR | Status: DC
  Administered 2013-07-21 – 2013-07-23 (×5): 5000 [IU] via SUBCUTANEOUS
  Filled 2013-07-21 (×11): qty 1

## 2013-07-21 MED ORDER — MORPHINE SULFATE 2 MG/ML IJ SOLN
1.0000 mg | INTRAMUSCULAR | Status: DC | PRN
  Administered 2013-07-21: 1 mg via INTRAVENOUS
  Filled 2013-07-21: qty 1

## 2013-07-21 MED ORDER — NITROGLYCERIN 0.4 MG SL SUBL: 0.4000 mg | SUBLINGUAL_TABLET | SUBLINGUAL | Status: DC | PRN

## 2013-07-21 MED ORDER — ASPIRIN 81 MG PO CHEW
81.0000 mg | CHEWABLE_TABLET | Freq: Every day | ORAL | Status: DC
  Administered 2013-07-22: 81 mg via ORAL
  Filled 2013-07-21: qty 1

## 2013-07-21 MED ORDER — SODIUM CHLORIDE 0.9 % IJ SOLN: 3.0000 mL | INTRAMUSCULAR | Status: DC | PRN

## 2013-07-21 MED ORDER — ASPIRIN 81 MG PO TABS: 81.0000 mg | ORAL_TABLET | Freq: Every day | ORAL | Status: DC

## 2013-07-21 MED ORDER — ONDANSETRON HCL 4 MG/2ML IJ SOLN: 4.0000 mg | Freq: Four times a day (QID) | INTRAMUSCULAR | Status: DC | PRN

## 2013-07-21 MED ORDER — SODIUM CHLORIDE 0.9 % IJ SOLN
3.0000 mL | Freq: Two times a day (BID) | INTRAMUSCULAR | Status: DC
  Administered 2013-07-21 – 2013-07-23 (×4): 3 mL via INTRAVENOUS

## 2013-07-21 MED ORDER — METOPROLOL TARTRATE 12.5 MG HALF TABLET
12.5000 mg | ORAL_TABLET | Freq: Two times a day (BID) | ORAL | Status: DC
  Administered 2013-07-21 – 2013-07-23 (×4): 12.5 mg via ORAL
  Filled 2013-07-21 (×5): qty 1

## 2013-07-21 MED ORDER — ISOSORBIDE MONONITRATE ER 30 MG PO TB24
30.0000 mg | ORAL_TABLET | Freq: Every day | ORAL | Status: DC
  Administered 2013-07-22 – 2013-07-23 (×2): 30 mg via ORAL
  Filled 2013-07-21 (×2): qty 1

## 2013-07-21 NOTE — H&P (Signed)
History and Physical  Patient ID: Jenna Hernandez MRN: [id number], DOB: 1950/12/21 Date of Encounter: 07/21/2013, 3:01 PM Primary Physician: Jenna Pandy, MD Primary Cardiologist: Dr. Patty Hernandez  Chief Complaint: neck pain similar to prior angina  HPI: Jenna Hernandez is a 62 y/o F with history of anxiety, GERD, fibromyalgia and NSTEMI 12/2011. She underwent cardiac cath at that time (peak troponin 7.7) demonstrating smooth and normal coronaries. LV gram showed normal LVEF, but there was a small area of severe HK in the mid-inf wall. Jenna Hernandez felt her NSTEMI was possibly due to transient thrombosis with resolution of thrombus. He doubted prox RCA spasm or thrombosis given the small size of the WMA. She has been on Imdur and Amlodipine. Had a stress echo 02/2012 that was negative for ischemia.  She was in her [employer] this AM at work registering patients at [employer]. She was sitting at her desk when she developed R sided neck pain at 9am. This was not associated with any nausea, vomiting, SOB, diaphoresis, palpitations or lightheadeness. She felt mild numbness to the area but denied any slurred speech, facial weakness, focal muscle weakness or vision changes. This felt similar to her prior NSTEMI except she didn't necessarily report jaw pain this time. She initially took Tylenol at 9:15 with no relief. By 9:30am the pain was severe at 8/10 so she took 1 SL NTG with complete relief within minutes. However, the pain soon started back up so she took another SL NTG. This eased the pain but did not resolve it so she went to the ER. She received ASA 324mg , NTG paste, 2mg  morphine, and 2mg  Zofran at [facility]. These measures reduced the pain down to a very mild lingering discomfort that is still present. She also reports a brief episode of SOB last night described as a tightness that resolved when she turned on her side. Initial troponin is neg x1, BNP wnl, the remainder of her labs  unremarkable except a glucose of 122. CXR nonacute. Not tachycardic, tachypnic or hypoxic. EKG without acute changes.  Past Medical History  Diagnosis Date  . GERD (gastroesophageal reflux disease)   . Fibromyalgia   . NSTEMI (non-ST elevated myocardial infarction)     Smooth and normal coronaries by cath 12/13/11, felt possibly secondary to transient thrombosis with resolution of thrombus  (troponin of ~7). EF 65% with small area of severe hypokinesis in mid inferior wall  . Anxiety   . Jaw pain     Has been told she has bone loss by dentist; also h/o abscess tooth.  . Osteoarthritis      Most Recent Cardiac Studies: Dobutamine Stress Echo 02/2012 Hypertensive response. No diagnostic ST segment changes with occasional PVCs. Negative electrocardiographic stress test. The LV appears hyperdynamic at peak with no clearly inducible WMA to indicate ischemia.  Cardiac Cath 12/2011 Procedure: left Heart Cardiac Catheterization Note  Indications: chest pain, abnormal cardiac enzymes  Procedure Details  Consent: Obtained  Time Out: Verified patient identification, verified procedure, site/side was marked, verified correct patient position, special equipment/implants available, Radiology Safety Procedures followed, medications/allergies/relevent history reviewed, required imaging and test results available. Performed  Medications:  Fentanyl: 50 mcg IV  Versed: 2 mg IV  The right femoral artery was easily canulated using a modified Seldinger technique.  Hemodynamics:  LV pressure: 103/5  Aortic pressure: 96/60  Angiography  Left Main: The left main is smooth and normal.  Left anterior Descending: Left anterior descending artery is smooth and normal. The LAD  gives off a moderate to large diagonal branch which is tortuous but otherwise normal. The distal LAD has some tortuosity.  Ramus intermediate vessel: The ramus intermediate vessel is large and normal.  Left Circumflex: The left circumflex  artery is a moderate-sized vessel. It gives off a very small first acute marginal artery and a large second obtuse marginal artery. Both of these branches are normal.  Right Coronary Artery: The right coronary artery is large and dominant. It is smooth and normal throughout its course. There is a pseudo-stenosis at the proximal portion of the RCA that is caused by the engagement of the judkins R4 catheter. The posterior descending artery and posterior lateral segment artery are normal.  LV Gram: Left ventriculogram was performed in the 30 RAO position. It reveals normal left ventricular systolic function. Ejection fraction is around 65. There is a small area of moderate - severe hypokinesis in the mid inferior wall.  Complications: No apparent complications  Patient did tolerate procedure well.  Conclusions:  1. Smooth and normal coronary arteries  2. Normal left ventricular systolic function but with a small area of severe hypokinesis in the mid inferior wall. This may have been due to transient thrombosis with resolution of the thrombus. I doubt she had proximal RCA spasm or thrombosis given the small size of the wall motion defect.  Rec. Heparin for 24 hours. ASA + plavix. Home in 1-2 days.  Would recommend standard post MI meds - Beta blocker, ace inhibitor.    Surgical History:  Past Surgical History  Procedure Laterality Date  . Cholecystectomy    . Total abdominal hysterectomy    . Inguinal hernia repair       Home Meds: Prior to Admission medications   Medication Sig Start Date End Date Taking? Authorizing Provider  acetaminophen (TYLENOL) 500 MG tablet Take 1,000 mg by mouth every 6 (six) hours as needed.    Historical Provider, MD  ALPRAZolam Prudy Feeler) 0.25 MG tablet Take 0.25 mg by mouth at bedtime as needed.    Historical Provider, MD  amLODipine (NORVASC) 5 MG tablet Take 1 tablet (5 mg total) by mouth daily. 02/02/13   Rosalio Macadamia, NP  aspirin 81 MG tablet Take 81 mg by mouth  daily.    Historical Provider, MD  clopidogrel (PLAVIX) 75 MG tablet TAKE ONE TABLET BY MOUTH IN THE MORNING WITH BREAKFAST 01/19/13   Cassell Clement, MD  isosorbide mononitrate (IMDUR) 30 MG 24 hr tablet TAKE ONE TABLET BY MOUTH EVERY DAY 01/30/13   Cassell Clement, MD  losartan (COZAAR) 25 MG tablet Take 1 tablet (25 mg total) by mouth daily. 04/21/13 04/21/14  Cassell Clement, MD  metoprolol tartrate (LOPRESSOR) 25 MG tablet TAKE ONE-HALF TABLET BY MOUTH TWICE DAILY 01/19/13   Cassell Clement, MD  pantoprazole (PROTONIX) 40 MG tablet Take 40 mg by mouth 2 (two) times daily.     Historical Provider, MD  pravastatin (PRAVACHOL) 20 MG tablet TAKE ONE TABLET BY MOUTH EVERY DAY 03/23/13   Cassell Clement, MD  ranitidine (ZANTAC) 150 MG tablet Take 1 tablet (150 mg total) by mouth 2 (two) times daily as needed for heartburn. 12/14/11   Dayna N Dunn, PA-C    Allergies:  Allergies  Allergen Reactions  . Diclofenac Sodium Anaphylaxis    Tolerates aspirin  . Meperidine-Promethazine     headache  . Ramipril     cough  . Sulfa Antibiotics   . Codeine Rash    History   Social History  .  Marital Status: Married    Spouse Name: N/A    Number of Children: N/A  . Years of Education: N/A   Occupational History  . Not on file.   Social History Main Topics  . Smoking status: Former Smoker -- 0.50 packs/day    Types: Cigarettes  . Smokeless tobacco: Not on file  . Alcohol Use: No  . Drug Use: Not on file  . Sexual Activity: Not Currently   Other Topics Concern  . Not on file   Social History Narrative   ** Merged History Encounter **         Family History  Problem Relation Age of Onset  . Coronary artery disease Father 15    Review of Systems: General: negative for chills, fever, night sweats. Gradual weight gain of 20+ lbs since MI - less active Cardiovascular: see above. No recent CP/neck pain besides this episode. Dermatological: negative for rash Respiratory: negative for  cough or wheezing Urologic: negative for hematuria Abdominal: negative for nausea, vomiting, diarrhea, bright red blood per rectum, melena, or hematemesis Neurologic: negative for visual changes, syncope, or dizziness All other systems reviewed and are otherwise negative except as noted above.  Labs: @ [facility] today Na 139, K 3.6, Cl 106, CO2 27, BUN 18, Cr 0.84, glu 121, cal 9.4 Tbili 0.7, AST 25, ALT 21, AP 61, Tprot 7.5, alb 4.4 CK 83, CKMB 1.6, troponin <0.01 BNP 51 PT 12.9, INR 1 WBC 5, Hgb 13.1, Hct 38.7, Plt 249   Radiology/Studies: @ [facility] today CXR: no acute thoracic findings, mild levoconvex upper thoracic scoliosis  EKG: [facility]: NSR 83bpm, TWI III, flattening avF/V2-V3, minimal ST sagging avL, RSR in V1 - Not significantly changed from prior  Repeat ECG at [facility] essentially the same without acute change from prior   Physical Exam: Blood pressure 127/66, pulse 73, temperature 98.6 F (37 C), temperature source Oral, resp. rate 18, height 5\' 3"  (1.6 m), weight 167 lb 5.3 oz (75.9 kg), SpO2 97.00%. General: Well developed, well nourished WF in no acute distress. Laying flat in bed Head: Normocephalic, atraumatic, sclera non-icteric, no xanthomas, nares are without discharge.  Neck: Negative for carotid bruits. JVD not elevated. Lungs: Clear bilaterally to auscultation without wheezes, rales, or rhonchi. Breathing is unlabored. Heart: RRR with S1 S2. No murmurs, rubs, or gallops appreciated. Abdomen: Soft, non-tender, non-distended with normoactive bowel sounds. No hepatomegaly. No rebound/guarding. No obvious abdominal masses. Msk:  Strength and tone appear normal for age. Extremities: No clubbing or cyanosis. No edema.  Distal pedal pulses are 2+ and equal bilaterally. Neuro: Alert and oriented X 3. No focal deficit. No facial asymmetry. Moves all extremities spontaneously. Psych:  Responds to questions appropriately with a normal affect.    ASSESSMENT AND PLAN:  1. Right-sided neck  pain 2. H/o NSTEMI 12/2011 possibly due to transient thrombosis with smooth & normal coronaries by cath at that time 3. Anxiety 4. H/o GERD 5. Fibromyalgia 6. Osteoarthritis  Neck pain is somewhat atypical but was NTG responsive. Will admit, cycle cardiac enzymes, and f/u ECG in AM. Continue home meds including ASA, BB, statin, Imdur & amlodipine. If enzymes negative, likely candidate for discharge in AM with outpatient followup in office.  Signed, Dayna Dunn PA-C 07/21/2013, 3:01 PM As above, patient seen and examined. Briefly she is a 62 year old female with fibromyalgia, gastroesophageal reflux disease and history of non-ST elevation myocardial infarction who is admitted with chest pain. Patient had her infarct in March of 2013. Cardiac catheterization at  that time showed normal coronary arteries and normal LV function. There is a small area of severe hypokinesis in the mid inferior wall. There was a question of transient thrombosis causing her infarct. Stress echocardiogram in May 2013 showed no ischemia. She has had intermittent right neck pain for the past year. It is not exertional. She developed more severe symptoms this morning. After 30 minutes she took a nitroglycerin with resolution. The pain however returned and she presented to [facility] emergency room. There was some improvement with nitroglycerin but not complete relief. There is no associated symptoms. She is transferred for further management. Electrocardiogram shows sinus rhythm with no ST changes. Initial enzymes negative. Symptoms are extremely atypical. Plan to continue to cycle enzymes. Repeat electrocardiogram tomorrow morning. If enzymes and ECG normal patient can be discharged on medications and followup with Dr. Patty Hernandez. Would continue aspirin, beta blocker and statin. Would discontinue Plavix as we are now greater than 12 months following her previous infarct. Olga Millers

## 2013-07-21 NOTE — Telephone Encounter (Signed)
Spoke with patient and she has taken 2 NTG and still feels some neck discomfort which was her symptom prior to her MI. Works at [employer]. Advised her to report to the [facility] ER for evaluation, EKG, and labs and to not walk there alone. She will have another staff member take her to the ER.

## 2013-07-21 NOTE — Telephone Encounter (Signed)
New problem:  Pt states she is calling in due to pain on the right side of her neck. Pt states she has a Hx of an MI... Pt states when she had her heart attack it started at pain in her neck... Pt states she took tylenol today around 9am.. Then took a nitro at 9:30 then took another nitro 5 mins after the first. Pt states her neck pain has eased up since the tylenol and two nitros. Pt would like to speak to the nurse. Pt is afraid she is having another heart attack

## 2013-07-22 DIAGNOSIS — F411 Generalized anxiety disorder: Secondary | ICD-10-CM

## 2013-07-22 LAB — BASIC METABOLIC PANEL
CO2: 27 mEq/L (ref 19–32)
Chloride: 109 mEq/L (ref 96–112)
Creatinine, Ser: 0.9 mg/dL (ref 0.50–1.10)
GFR calc Af Amer: 77 mL/min — ABNORMAL LOW (ref >90)
Potassium: 4.3 mEq/L (ref 3.5–5.1)
Sodium: 145 mEq/L (ref 135–145)

## 2013-07-22 LAB — TROPONIN I: Troponin I: <0.3 ng/mL (ref <0.30)

## 2013-07-22 LAB — CBC
MCV: 91.9 fL (ref 78.0–100.0)
Platelets: 214 10*3/uL (ref 150–400)
RBC: 3.83 MIL/uL — ABNORMAL LOW (ref 3.87–5.11)
RDW: 12.8 % (ref 11.5–15.5)
WBC: 4.8 10*3/uL (ref 4.0–10.5)

## 2013-07-22 MED ORDER — AMLODIPINE BESYLATE 10 MG PO TABS
10.0000 mg | ORAL_TABLET | Freq: Every day | ORAL | Status: DC
  Administered 2013-07-22 – 2013-07-23 (×2): 10 mg via ORAL
  Filled 2013-07-22 (×2): qty 1

## 2013-07-22 NOTE — Progress Notes (Addendum)
Patient: Raima Geathers [facility] / Admit Date: 07/21/2013 / Date of Encounter: 07/22/2013, 7:19 AM   Subjective  Very slight neck discomfort continued through yest afternoon and night but nothing like pain that prompted admision. Slept well, neck pain gone upon waking. She awoke with mild right sided jaw discomfort which is not unusual - she wakes with this frequently multiple times per week for the last several months. Her dentist has suggested that she may be grinding her teeth/clenching jaw at night and has recommended a mouthguard. No current symptoms.  Objective   Telemetry: Sinus brady/NSR. Occasional PVCs. 6 beats AIVR early this AM then another 3 beats.  Physical Exam: Blood pressure 106/65, pulse 83, temperature 97.9 F (36.6 C), temperature source Oral, resp. rate 13, height 5\' 3"  (1.6 m), weight 167 lb 5.3 oz (75.9 kg), SpO2 95.00%. General: Well developed, well nourished, in no acute distress. Neck: Negative for carotid bruits. JVP not elevated. Lungs: Clear bilaterally to auscultation without wheezes, rales, or rhonchi. Breathing is unlabored. Heart: RRR S1 S2 without murmurs, rubs, or gallops.  Abdomen: Soft, non-tender, non-distended with normoactive bowel sounds. No rebound/guarding. Extremities: No edema. Distal pedal pulses are 2+ and equal bilaterally. Neuro: Alert and oriented X 3.  Psych:  Responds to questions appropriately with a normal affect.   Intake/Output Summary (Last 24 hours) at 07/22/13 0719 Last data filed at 07/21/13 2215  Gross per 24 hour  Intake      3 ml  Output      0 ml  Net      3 ml    Inpatient Medications:  . amLODipine  5 mg Oral Daily  . aspirin  81 mg Oral QHS  . heparin  5,000 Units Subcutaneous Q8H  . isosorbide mononitrate  30 mg Oral Daily  . losartan  25 mg Oral Daily  . metoprolol tartrate  12.5 mg Oral BID  . pantoprazole  40 mg Oral BID  . simvastatin  10 mg Oral q1800  . sodium chloride  3 mL Intravenous Q12H    Infusions:    Labs:  Recent Labs  07/21/13 1547 07/22/13 0454  NA  --  145  K  --  4.3  CL  --  109  CO2  --  27  GLUCOSE  --  99  BUN  --  17  CREATININE 0.79 0.90  CALCIUM  --  8.8    Recent Labs  07/21/13 1547 07/22/13 0454  WBC 5.2 4.8  HGB 12.2 12.0  HCT 35.7* 35.2*  MCV 90.4 91.9  PLT 222 214    Recent Labs  07/21/13 1547 07/21/13 2226 07/22/13 0454  TROPONINI <0.30 <0.30 <0.30    Recent Labs  07/21/13 1547  HGBA1C 5.7*      Assessment and Plan  Ms. Langford is a 62 year old female with anxiety, GERD, OA, and PMH of NSTEMI in 12/2011. She presented to ED yest c/o right sided neck pain which was similar to pain at time of NSTEMI in 3/13.   1. Neck/jaw pain: Intermittent neck pain throughout past year (Of note, she has known OA and was told by ortho physician recently that her neck pain could be 2/2 OA). Neck pain worsened yest, and was relieved with SL NTG, so she presented to ED. She was concerned as neck pain was her presenting sx last year at time of NSTEMI. Yesterday she had lingering right sided neck pain, constant throughout afternoon and evening. When she awoke this morning  it was gone and has not returned. She did c/o very mild right sided jaw pain this morning but suspect this is related to jaw clenching as suggested by dentist. No CP. No SOB. Unlikely to be cardiac related as troponins neg x3. Last trop neg at 20 hrs after initial sx onset.   2. Abnormal ECG: 12 lead yest showed TWI lead III, flat T in AVF, V2, V3. RSR in V1. 12 lead this morning has slightly changed, with new TWI in V1-V4. Do not feel this is concerning for ischemia in setting of neg trops and completely clean coronaries with 3/13 cath. F/U with Dr. Patty Sermons as outpt.   3. AIVR: 6 beat run overnight. Asymptomatic. F/U with Dr. Patty Sermons as outpt.   4. Hx NSTEMI: Continue home regimen aspirin, bb, statin, imdur, amlodipine, cozaar. Ok to D/C plavix as she is over 1.5 years out from  NSTEMI.   Signed, Raelyn Ensign PA-S [facility]   Per discussion with Dr. Delton See, pt prefers to remain in house for observation. Will increase amlodipine to cover for possible spasm with initial episode of neck pain (will discontinue losartan to allow for more room with BP). See below for additional thoughts. Ronie Spies PA-C    The patient was seen, examined and discussed with Ronie Spies, PA-C and I agree with the above.  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 We will increase amlodipine to 10 mg daily and d/c cozaar as BP is borderline. If the pain persists we will increase imdur.  Tobias Alexander, H 07/22/2013

## 2013-07-23 MED ORDER — AMLODIPINE BESYLATE 10 MG PO TABS: 10.0000 mg | ORAL_TABLET | Freq: Every day | ORAL | Status: DC

## 2013-07-23 NOTE — Progress Notes (Signed)
     Subjective:  Feels better. Neck pain better.  Tolerating higher dose of amlodipine. Off cozaar now.  Objective:  Vital Signs in the last 24 hours: Temp:  [98 F (36.7 C)-98.7 F (37.1 C)] 98.2 F (36.8 C) (10/16 0443) Pulse Rate:  [57-79] 64 (10/16 0443) Resp:  [11-24] 18 (10/16 0741) BP: (106-138)/(55-96) 106/55 mmHg (10/16 0443) SpO2:  [95 %-97 %] 96 % (10/16 0443)  Intake/Output from previous day: 10/15 0701 - 10/16 0700 In: 240 [P.O.:240] Out: -  Intake/Output from this shift:    . amLODipine  10 mg Oral Daily  . aspirin  81 mg Oral QHS  . heparin  5,000 Units Subcutaneous Q8H  . isosorbide mononitrate  30 mg Oral Daily  . metoprolol tartrate  12.5 mg Oral BID  . pantoprazole  40 mg Oral BID  . simvastatin  10 mg Oral q1800  . sodium chloride  3 mL Intravenous Q12H      Physical Exam: The patient appears to be in no distress.  Head and neck exam reveals that the pupils are equal and reactive.  The extraocular movements are full.  There is no scleral icterus.  Mouth and pharynx are benign.  No lymphadenopathy.  No carotid bruits.  The jugular venous pressure is normal.  Thyroid is not enlarged or tender.  Chest is clear to percussion and auscultation.  No rales or rhonchi.  Expansion of the chest is symmetrical.  Heart reveals no abnormal lift or heave.  First and second heart sounds are normal.  There is no murmur gallop rub or click.  The abdomen is soft and nontender.  Bowel sounds are normoactive.  There is no hepatosplenomegaly or mass.  There are no abdominal bruits.  Extremities reveal no phlebitis or edema.  Pedal pulses are good.  There is no cyanosis or clubbing.  Neurologic exam is normal strength and no lateralizing weakness.  No sensory deficits.  Integument reveals no rash  Lab Results:  Recent Labs  07/21/13 1547 07/22/13 0454  WBC 5.2 4.8  HGB 12.2 12.0  PLT 222 214    Recent Labs  07/21/13 1547 07/22/13 0454  NA  --  145  K   --  4.3  CL  --  109  CO2  --  27  GLUCOSE  --  99  BUN  --  17  CREATININE 0.79 0.90    Recent Labs  07/21/13 2226 07/22/13 0454  TROPONINI <0.30 <0.30   Hepatic Function Panel No results found for this basename: PROT, ALBUMIN, AST, ALT, ALKPHOS, BILITOT, BILIDIR, IBILI,  in the last 72 hours No results found for this basename: CHOL,  in the last 72 hours No results found for this basename: PROTIME,  in the last 72 hours  Imaging: No results found.  Cardiac Studies: Telemetry shows NSR Assessment/Plan:  1. Hx of NSTEMI.  No evidence of MI this admission. 2. Neck pain ? Etiology. Possibly vascular spasm. Possibly osteoarthritis. Improved on amlodipine. EKG today improved.  Plan: Okay for discharge today. Return to work Monday Oct 20.  She already has an appointment to see me Oct 30.  LOS: 2 days    Jenna Hernandez 07/23/2013, 7:52 AM

## 2013-07-23 NOTE — Discharge Summary (Signed)
Discharge Summary   Patient ID: Jenna Hernandez,  MRN: [id number], DOB/AGE: 1951-06-26 62 y.o.  Admit date: 07/21/2013 Discharge date: 07/23/2013  Primary Care Provider: Estanislado Pandy Primary Cardiologist: Wylene Simmer, MD   Discharge Diagnoses Principal Problem:   Neck pain Active Problems:   History of non-ST elevation myocardial infarction (NSTEMI)   Anxiety   Osteoarthritis   Fibromyalgia  Allergies Allergies  Allergen Reactions  . Diclofenac Sodium Anaphylaxis    Tolerates aspirin - was told not to take antiinflammatory meds.  . Meperidine-Promethazine     headache  . Ramipril     cough  . Sulfa Antibiotics   . Codeine Rash   Procedures  None.  History of Present Illness  62 year old female with prior history of non-ST segment elevation myocardial infarction in March of 2013 which was felt to be secondary to possible transient thrombosis with resolution of thrombus in the setting of normal coronary arteries on catheterization. Though coronary vasospasm was also considered a possibility and she has been managed with long-acting nitrates and calcium channel blocker therapy. She was in her usual state of health until the morning of admission when she developed right-sided neck pain that resolved with nitroglycerin. Pain recurred prompting her to present to the [facility] emergency department where ECG was without acute changes and labs were unremarkable. She was transferred to [facility] cone for further evaluation.  Hospital Course  Patient ruled out for myocardial infarction. She's had no further chest discomfort. In the setting of possible history of coronary vasospasm, we have titrated her calcium channel blocker therapy and in order to make room for this titration, her home dose of ARB therapy has been discontinued. She'll be discharged home today in good condition. She has followup arranged for next week in the outpatient setting.  Discharge  Vitals Blood pressure 105/63, pulse 73, temperature 98.8 F (37.1 C), temperature source Oral, resp. rate 20, height 5\' 3"  (1.6 m), weight 167 lb 5.3 oz (75.9 kg), SpO2 96.00%.  Filed Weights   07/21/13 1500  Weight: 167 lb 5.3 oz (75.9 kg)    Labs  CBC  Recent Labs  07/21/13 1547 07/22/13 0454  WBC 5.2 4.8  HGB 12.2 12.0  HCT 35.7* 35.2*  MCV 90.4 91.9  PLT 222 214   Basic Metabolic Panel  Recent Labs  07/21/13 1547 07/22/13 0454  NA  --  145  K  --  4.3  CL  --  109  CO2  --  27  GLUCOSE  --  99  BUN  --  17  CREATININE 0.79 0.90  CALCIUM  --  8.8   Cardiac Enzymes  Recent Labs  07/21/13 1547 07/21/13 2226 07/22/13 0454  TROPONINI <0.30 <0.30 <0.30   Hemoglobin A1C  Recent Labs  07/21/13 1547  HGBA1C 5.7*   Disposition  Pt is being discharged home today in good condition.  Follow-up Plans & Appointments      Follow-up Information   Follow up with Cassell Clement, MD On 08/06/2013. (10:15 AM)    Specialty:  Cardiology   Contact information:   [address] [place] [address] [phone]       Follow up with Estanislado Pandy, MD.   Specialty:  Cardiology   Contact information:   [address] [place] [address] [phone]       Discharge Medications    Medication List    STOP taking these medications       clopidogrel 75  MG tablet  Commonly known as:  PLAVIX     losartan 25 MG tablet  Commonly known as:  COZAAR      TAKE these medications       acetaminophen 500 MG tablet  Commonly known as:  TYLENOL  Take 1,000 mg by mouth every 6 (six) hours as needed.     ALPRAZolam 0.25 MG tablet  Commonly known as:  XANAX  Take 0.25 mg by mouth at bedtime as needed.     amLODipine 10 MG tablet  Commonly known as:  NORVASC  Take 1 tablet (10 mg total) by mouth daily.     aspirin 81 MG tablet  Take 81 mg by mouth at bedtime.     isosorbide mononitrate 30 MG 24 hr tablet  Commonly known as:  IMDUR   Take 30 mg by mouth daily.     metoprolol tartrate 25 MG tablet  Commonly known as:  LOPRESSOR  Take 12.5 mg by mouth 2 (two) times daily.     nitroGLYCERIN 0.4 MG SL tablet  Commonly known as:  NITROSTAT  Place 0.4 mg under the tongue every 5 (five) minutes as needed for chest pain (up to 3 doses).     pantoprazole 40 MG tablet  Commonly known as:  PROTONIX  Take 40 mg by mouth 2 (two) times daily.     pravastatin 20 MG tablet  Commonly known as:  PRAVACHOL  Take 20 mg by mouth every evening.     ranitidine 150 MG tablet  Commonly known as:  ZANTAC  Take 1 tablet (150 mg total) by mouth 2 (two) times daily as needed for heartburn.       Outstanding Labs/Studies  None  Duration of Discharge Encounter   Greater than 30 minutes including physician time.  Signed, Nicolasa Ducking NP 07/23/2013, 11:36 AM

## 2013-07-28 ENCOUNTER — Other Ambulatory Visit: Payer: Self-pay

## 2013-07-28 MED ORDER — ISOSORBIDE MONONITRATE ER 30 MG PO TB24: 30.0000 mg | ORAL_TABLET | Freq: Every day | ORAL | Status: DC

## 2013-08-06 ENCOUNTER — Ambulatory Visit (INDEPENDENT_AMBULATORY_CARE_PROVIDER_SITE_OTHER): Payer: PRIVATE HEALTH INSURANCE | Admitting: Cardiology

## 2013-08-06 ENCOUNTER — Encounter: Payer: Self-pay | Admitting: Cardiology

## 2013-08-06 VITALS — BP 120/68 | HR 58 | Ht 63.0 in | Wt 167.0 lb

## 2013-08-06 DIAGNOSIS — I252 Old myocardial infarction: Secondary | ICD-10-CM

## 2013-08-06 DIAGNOSIS — I119 Hypertensive heart disease without heart failure: Secondary | ICD-10-CM

## 2013-08-06 DIAGNOSIS — K219 Gastro-esophageal reflux disease without esophagitis: Secondary | ICD-10-CM

## 2013-08-06 DIAGNOSIS — I25810 Atherosclerosis of coronary artery bypass graft(s) without angina pectoris: Secondary | ICD-10-CM

## 2013-08-06 DIAGNOSIS — I10 Essential (primary) hypertension: Secondary | ICD-10-CM | POA: Insufficient documentation

## 2013-08-06 DIAGNOSIS — I2510 Atherosclerotic heart disease of native coronary artery without angina pectoris: Secondary | ICD-10-CM

## 2013-08-06 MED ORDER — LOSARTAN POTASSIUM 25 MG PO TABS: 25.0000 mg | ORAL_TABLET | Freq: Every day | ORAL | Status: DC

## 2013-08-06 MED ORDER — AMLODIPINE BESYLATE 5 MG PO TABS: 5.0000 mg | ORAL_TABLET | Freq: Every day | ORAL | Status: DC

## 2013-08-06 NOTE — Assessment & Plan Note (Signed)
She is not tolerating the higher dose of amlodipine.  We will reduce the dose back to the former dose of 5 mg daily and we will restart the losartan that was stopped in the hospital in order to make room for the higher dose amlodipine.

## 2013-08-06 NOTE — Assessment & Plan Note (Signed)
The patient has not been having any flareup of her GERD recently

## 2013-08-06 NOTE — Patient Instructions (Addendum)
DECREASE YOUR AMLODIPINE TO 5 MG DAILY  RESTART LOSARTAN 25 MG DAILY  Your physician wants you to follow-up in: 6 MONTHS You will receive a reminder letter in the mail two months in advance. If you don't receive a letter, please call our office to schedule the follow-up appointment.   USE NITROGLYCERIN AS NEEDED FOR CHEST PAINS

## 2013-08-06 NOTE — Assessment & Plan Note (Signed)
She has not had any more chest pain or left or right neck pain since discharge from the hospital.  However, she has developed peripheral edema from the higher dose of amlodipine.

## 2013-08-06 NOTE — Progress Notes (Signed)
Jenna Hernandez Date of Birth:  Nov 01, 1950 [address] [place], [place]  [address] [phone]  Fax   [phone]  HPI: This pleasant 62 year old woman is seen for a scheduled followup office visit.  Several months ago she was admitted overnight for observation at [facility] for chest pain and she ruled out. Her EKG and enzymes and chest x-ray were normal and she was discharged the next day. She has a history of a non-STEMI on 12/13/11. She underwent cardiac catheterization which showed smooth and normal coronary arteries it was felt that she had a myocardial infarction secondary to possible transient spasm with thrombosis since she did have some hypokinesis in the mid inferior wall the overall ejection fraction was 65%. She was readmitted to the hospital on 12/26/11 with chest pain and was discharged the following day after ruling out. During her readmission her initial cardiac catheter films were reviewed by the admitting physician Dr. Kirke Corin who felt that she might have had a small branch occlusion of off the right coronary artery to explain her wall motion abnormalities. Patient has been doing well with no recurrent chest pain. She has had some bilateral jaw pain and recently saw her dentist who diagnosed a problem with some bone loss. The patient admits to being under a lot of stress. She has stress at home and at work. Her work consists of sitting at a [employer] all day and she is not getting any regular exercise.  The patient was hospitalized briefly 07/21/13 until 07/23/13.  She ruled out for myocardial infarction.  She presented with left neck pain.  Her amlodipine was increased from 5 mg to 10 mg to treat possible vascular spasm.   Current Outpatient Prescriptions  Medication Sig Dispense Refill  . acetaminophen (TYLENOL) 500 MG tablet Take 1,000 mg by mouth every 6 (six) hours as needed.      . ALPRAZolam (XANAX) 0.25 MG tablet Take 0.25 mg by mouth  at bedtime as needed.      Marland Kitchen aspirin 81 MG tablet Take 81 mg by mouth at bedtime.       . isosorbide mononitrate (IMDUR) 30 MG 24 hr tablet Take 1 tablet (30 mg total) by mouth daily.  30 tablet  6  . metoprolol tartrate (LOPRESSOR) 25 MG tablet Take 12.5 mg by mouth 2 (two) times daily.      . nitroGLYCERIN (NITROSTAT) 0.4 MG SL tablet Place 1 tablet (0.4 mg total) under the tongue every 5 (five) minutes x 3 doses as needed for chest pain.  25 tablet  4  . pantoprazole (PROTONIX) 40 MG tablet Take 40 mg by mouth 2 (two) times daily.       . pravastatin (PRAVACHOL) 20 MG tablet Take 20 mg by mouth every evening.      . ranitidine (ZANTAC) 150 MG tablet Take 1 tablet (150 mg total) by mouth 2 (two) times daily as needed for heartburn.      Marland Kitchen amLODipine (NORVASC) 5 MG tablet Take 1 tablet (5 mg total) by mouth daily.  30 tablet  11  . losartan (COZAAR) 25 MG tablet Take 1 tablet (25 mg total) by mouth daily.  30 tablet  11   No current facility-administered medications for this visit.    Allergies  Allergen Reactions  . Diclofenac Sodium Anaphylaxis    Tolerates aspirin - was told not to take antiinflammatory meds.  . Meperidine-Promethazine     headache  . Ramipril  cough  . Sulfa Antibiotics   . Codeine Rash    Patient Active Problem List   Diagnosis Date Noted  . Neck pain 07/21/2013  . Fibromyalgia 07/21/2013  . Osteoarthritis 03/17/2013  . Low back pain 11/28/2012  . Cough due to ACE inhibitor 04/04/2012  . Anxiety 01/01/2012  . CAD (coronary artery disease) 01/01/2012  . GERD (gastroesophageal reflux disease) 01/01/2012  . History of non-ST elevation myocardial infarction (NSTEMI) 12/13/2011    History  Smoking status  . Former Smoker -- 0.50 packs/day for 23 years  . Types: Cigarettes  . Quit date: 12/12/2011  Smokeless tobacco  . Never Used    History  Alcohol Use No    Family History  Problem Relation Age of Onset  . Coronary artery disease Father 54     Review of Systems: The patient denies any heat or cold intolerance.  No weight gain or weight loss.  The patient denies headaches or blurry vision.  There is no cough or sputum production.  The patient denies dizziness.  There is no hematuria or hematochezia.  The patient denies any muscle aches or arthritis.  The patient denies any rash.  The patient denies frequent falling or instability.  There is no history of depression or anxiety.  All other systems were reviewed and are negative.   Physical Exam: Filed Vitals:   08/06/13 1042  BP: 120/68  Pulse: 58   the general appearance reveals a well-developed well-nourished woman in no distress.The head and neck exam reveals pupils equal and reactive.  Extraocular movements are full.  There is no scleral icterus.  The mouth and pharynx are normal.  The neck is supple.  The carotids reveal no bruits.  The jugular venous pressure is normal.  The  thyroid is not enlarged.  There is no lymphadenopathy.  The chest is clear to percussion and auscultation.  There are no rales or rhonchi.  Expansion of the chest is symmetrical.  The precordium is quiet.  The first heart sound is normal.  The second heart sound is physiologically split.  There is no murmur gallop rub or click.  There is no abnormal lift or heave.  The abdomen is soft and nontender.  The bowel sounds are normal.  The liver and spleen are not enlarged.  There are no abdominal masses.  There are no abdominal bruits.  Extremities reveal good pedal pulses.  There is no phlebitis .  There is bilateral pedal edema.   There is no cyanosis or clubbing.  Strength is normal and symmetrical in all extremities.  There is no lateralizing weakness.  There are no sensory deficits.  The skin is warm and dry.  There is no rash.   EKG today shows sinus bradycardia and no ischemic changes.  Since last tracing of 07/21/13, the rate is slower.   Assessment / Plan: Because of her peripheral edema we will reduce  her dose of amlodipine back to 5 mg daily.  She has a recurrence of her neck pain she may use sublingual nitroglycerin for relief of vascular spasm.  She will resume losartan 25 mg daily.  Recheck in 6 months for followup office visit.

## 2013-08-08 HISTORY — PX: COLONOSCOPY: SHX174

## 2013-10-03 ENCOUNTER — Other Ambulatory Visit: Payer: Self-pay | Admitting: Cardiology

## 2013-10-17 ENCOUNTER — Other Ambulatory Visit: Payer: Self-pay | Admitting: Cardiology

## 2013-11-10 ENCOUNTER — Observation Stay (HOSPITAL_COMMUNITY)
Admission: AD | Admit: 2013-11-10 | Discharge: 2013-11-11 | Disposition: A | Discharge status: Home / Self Care | Payer: PRIVATE HEALTH INSURANCE | Source: Other Acute Inpatient Hospital | Attending: Cardiology | Admitting: Cardiology

## 2013-11-10 ENCOUNTER — Encounter (HOSPITAL_COMMUNITY): Payer: Self-pay | Admitting: *Deleted

## 2013-11-10 DIAGNOSIS — I2510 Atherosclerotic heart disease of native coronary artery without angina pectoris: Secondary | ICD-10-CM | POA: Diagnosis present

## 2013-11-10 DIAGNOSIS — IMO0001 Reserved for inherently not codable concepts without codable children: Secondary | ICD-10-CM | POA: Insufficient documentation

## 2013-11-10 DIAGNOSIS — R072 Precordial pain: Secondary | ICD-10-CM

## 2013-11-10 DIAGNOSIS — I10 Essential (primary) hypertension: Secondary | ICD-10-CM | POA: Diagnosis present

## 2013-11-10 DIAGNOSIS — F411 Generalized anxiety disorder: Secondary | ICD-10-CM | POA: Insufficient documentation

## 2013-11-10 DIAGNOSIS — T464X5A Adverse effect of angiotensin-converting-enzyme inhibitors, initial encounter: Secondary | ICD-10-CM | POA: Diagnosis present

## 2013-11-10 DIAGNOSIS — I252 Old myocardial infarction: Secondary | ICD-10-CM | POA: Insufficient documentation

## 2013-11-10 DIAGNOSIS — R079 Chest pain, unspecified: Secondary | ICD-10-CM | POA: Diagnosis present

## 2013-11-10 DIAGNOSIS — R05 Cough: Secondary | ICD-10-CM | POA: Diagnosis present

## 2013-11-10 DIAGNOSIS — Z7982 Long term (current) use of aspirin: Secondary | ICD-10-CM | POA: Insufficient documentation

## 2013-11-10 DIAGNOSIS — R058 Other specified cough: Secondary | ICD-10-CM | POA: Diagnosis present

## 2013-11-10 DIAGNOSIS — M797 Fibromyalgia: Secondary | ICD-10-CM | POA: Diagnosis present

## 2013-11-10 DIAGNOSIS — Z87891 Personal history of nicotine dependence: Secondary | ICD-10-CM | POA: Insufficient documentation

## 2013-11-10 DIAGNOSIS — R0789 Other chest pain: Principal | ICD-10-CM | POA: Insufficient documentation

## 2013-11-10 DIAGNOSIS — M1990 Unspecified osteoarthritis, unspecified site: Secondary | ICD-10-CM | POA: Insufficient documentation

## 2013-11-10 DIAGNOSIS — K219 Gastro-esophageal reflux disease without esophagitis: Secondary | ICD-10-CM | POA: Diagnosis present

## 2013-11-10 LAB — CBC
HCT: 37.3 % (ref 36.0–46.0)
Hemoglobin: 12.6 g/dL (ref 12.0–15.0)
MCH: 31.1 pg (ref 26.0–34.0)
MCHC: 33.8 g/dL (ref 30.0–36.0)
MCV: 92.1 fL (ref 78.0–100.0)
PLATELETS: 243 10*3/uL (ref 150–400)
RBC: 4.05 MIL/uL (ref 3.87–5.11)
RDW: 12.6 % (ref 11.5–15.5)
WBC: 6.3 10*3/uL (ref 4.0–10.5)

## 2013-11-10 LAB — CREATININE, SERUM
CREATININE: 0.81 mg/dL (ref 0.50–1.10)
GFR calc non Af Amer: 76 mL/min — ABNORMAL LOW (ref >90)
GFR, EST AFRICAN AMERICAN: 88 mL/min — AB (ref >90)

## 2013-11-10 LAB — TROPONIN I

## 2013-11-10 LAB — MRSA PCR SCREENING: MRSA by PCR: NEGATIVE

## 2013-11-10 MED ORDER — AMLODIPINE BESYLATE 5 MG PO TABS
5.0000 mg | ORAL_TABLET | Freq: Every day | ORAL | Status: DC
  Administered 2013-11-11: 5 mg via ORAL
  Filled 2013-11-10 (×2): qty 1

## 2013-11-10 MED ORDER — ALPRAZOLAM 0.25 MG PO TABS
0.2500 mg | ORAL_TABLET | Freq: Every evening | ORAL | Status: DC | PRN
  Administered 2013-11-10: 0.25 mg via ORAL
  Filled 2013-11-10: qty 1

## 2013-11-10 MED ORDER — HEPARIN SODIUM (PORCINE) 5000 UNIT/ML IJ SOLN
5000.0000 [IU] | Freq: Three times a day (TID) | INTRAMUSCULAR | Status: DC
  Administered 2013-11-10 – 2013-11-11 (×2): 5000 [IU] via SUBCUTANEOUS
  Filled 2013-11-10 (×5): qty 1

## 2013-11-10 MED ORDER — PANTOPRAZOLE SODIUM 40 MG PO TBEC
40.0000 mg | DELAYED_RELEASE_TABLET | Freq: Two times a day (BID) | ORAL | Status: DC
  Administered 2013-11-11: 40 mg via ORAL
  Filled 2013-11-10: qty 1

## 2013-11-10 MED ORDER — FAMOTIDINE 20 MG PO TABS
20.0000 mg | ORAL_TABLET | Freq: Every day | ORAL | Status: DC
  Administered 2013-11-10 – 2013-11-11 (×2): 20 mg via ORAL
  Filled 2013-11-10 (×2): qty 1

## 2013-11-10 MED ORDER — LOSARTAN POTASSIUM 25 MG PO TABS
25.0000 mg | ORAL_TABLET | Freq: Every day | ORAL | Status: DC
  Administered 2013-11-11: 25 mg via ORAL
  Filled 2013-11-10 (×2): qty 1

## 2013-11-10 MED ORDER — NITROGLYCERIN 0.4 MG SL SUBL: 0.4000 mg | SUBLINGUAL_TABLET | SUBLINGUAL | Status: DC | PRN

## 2013-11-10 MED ORDER — ONDANSETRON HCL 4 MG/2ML IJ SOLN: 4.0000 mg | Freq: Four times a day (QID) | INTRAMUSCULAR | Status: DC | PRN

## 2013-11-10 MED ORDER — ACETAMINOPHEN 500 MG PO TABS
1000.0000 mg | ORAL_TABLET | Freq: Four times a day (QID) | ORAL | Status: DC | PRN
  Administered 2013-11-10: 1000 mg via ORAL
  Filled 2013-11-10: qty 2

## 2013-11-10 MED ORDER — ACETAMINOPHEN 325 MG PO TABS: 650.0000 mg | ORAL_TABLET | ORAL | Status: DC | PRN

## 2013-11-10 MED ORDER — NITROGLYCERIN IN D5W 200-5 MCG/ML-% IV SOLN
2.0000 ug/min | INTRAVENOUS | Status: DC
  Administered 2013-11-10: 35 ug/min via INTRAVENOUS

## 2013-11-10 MED ORDER — ISOSORBIDE MONONITRATE ER 30 MG PO TB24
30.0000 mg | ORAL_TABLET | Freq: Every day | ORAL | Status: DC
  Filled 2013-11-10: qty 1

## 2013-11-10 MED ORDER — METOPROLOL TARTRATE 12.5 MG HALF TABLET
12.5000 mg | ORAL_TABLET | Freq: Two times a day (BID) | ORAL | Status: DC
  Administered 2013-11-10 – 2013-11-11 (×2): 12.5 mg via ORAL
  Filled 2013-11-10 (×3): qty 1

## 2013-11-10 MED ORDER — SIMVASTATIN 10 MG PO TABS
10.0000 mg | ORAL_TABLET | Freq: Every day | ORAL | Status: DC
  Filled 2013-11-10: qty 1

## 2013-11-10 NOTE — H&P (Signed)
HPI: The patient is a 63 y/o female, followed by Dr. Mare Ferrari. She has a history of a non-STEMI on 12/13/11. She underwent cardiac catheterization which showed smooth and normal coronary arteries it was felt that she had a myocardial infarction secondary to possible transient spasm with thrombosis since she did have some hypokinesis in the mid inferior wall the overall ejection fraction was 65%. She was readmitted to the hospital on 12/26/11 with chest pain and was discharged the following day after ruling out. During her readmission her initial cardiac catheter films were reviewed by the admitting physician Dr. Fletcher Anon who felt that she might have had a small branch occlusion of off the right coronary artery to explain her wall motion abnormalities. The patient was hospitalized again briefly 07/21/13 until 07/23/13. She ruled out for myocardial infarction. She presented with left neck pain. Her amlodipine was increased from 5 mg to 10 mg to treat possible vascular spasm. She was last seen by Dr. Mare Ferrari on 08/06/13. Patient presents today with complaints of recurrent chest pain. Her symptoms started 4 days ago. She was walking across the parking lot and felt a "pound". Since then she has had intermittent chest tightness. It is in the mid upper chest without radiation. Pain is not pleuritic or positional. Some symptoms with exertion and some at rest. Symptoms more prolonged today and relieved with nitroglycerin. She also describes dyspnea on exertion but no orthopnea, PND or pedal edema.  Medications Prior to Admission  Medication Sig Dispense Refill  . acetaminophen (TYLENOL) 500 MG tablet Take 1,000 mg by mouth every 6 (six) hours as needed.      . ALPRAZolam (XANAX) 0.25 MG tablet Take 0.25 mg by mouth at bedtime as needed.      Marland Kitchen amLODipine (NORVASC) 5 MG tablet Take 1 tablet (5 mg total) by mouth daily.  30 tablet  11  . aspirin 81 MG tablet Take 81 mg by mouth at bedtime.       . isosorbide  mononitrate (IMDUR) 30 MG 24 hr tablet Take 1 tablet (30 mg total) by mouth daily.  30 tablet  6  . losartan (COZAAR) 25 MG tablet Take 1 tablet (25 mg total) by mouth daily.  30 tablet  11  . metoprolol tartrate (LOPRESSOR) 25 MG tablet TAKE ONE-HALF TABLET BY MOUTH TWICE DAILY  30 tablet  6  . nitroGLYCERIN (NITROSTAT) 0.4 MG SL tablet Place 1 tablet (0.4 mg total) under the tongue every 5 (five) minutes x 3 doses as needed for chest pain.  25 tablet  4  . pantoprazole (PROTONIX) 40 MG tablet Take 40 mg by mouth 2 (two) times daily.       . pravastatin (PRAVACHOL) 20 MG tablet Take 20 mg by mouth every evening.      . pravastatin (PRAVACHOL) 20 MG tablet TAKE ONE TABLET BY MOUTH ONCE DAILY  30 tablet  2  . ranitidine (ZANTAC) 150 MG tablet Take 1 tablet (150 mg total) by mouth 2 (two) times daily as needed for heartburn.        Allergies  Allergen Reactions  . Diclofenac Sodium Anaphylaxis    Tolerates aspirin - was told not to take antiinflammatory meds.  . Meperidine-Promethazine     headache  . Ramipril     cough  . Sulfa Antibiotics   . Codeine Rash    Past Medical History  Diagnosis Date  . GERD (gastroesophageal reflux disease)   . Fibromyalgia   . NSTEMI (non-ST  elevated myocardial infarction)     Smooth and normal coronaries by cath 12/13/11, felt possibly secondary to transient thrombosis with resolution of thrombus  (troponin of ~7). EF 65% with small area of severe hypokinesis in mid inferior wall  . Anxiety   . Jaw pain     Has been told she has bone loss by dentist.  . Osteoarthritis   . Hypertension   . H/O hiatal hernia   . Chronic back pain     "from the neck down" (07/21/2013)    Past Surgical History  Procedure Laterality Date  . Cholecystectomy  ~ 2004  . Inguinal hernia repair Right ~ 1983    "femoral" (07/21/2013)  . Vaginal hysterectomy  1990  . Tubal ligation  1979  . Cystectomy  ?1990's    "from my back" (07/21/2013)  . Cardiac catheterization   12/2011    History   Social History  . Marital Status: Divorced    Spouse Name: N/A    Number of Children: N/A  . Years of Education: N/A   Occupational History  .      [employer] - registration specialist.   Social History Main Topics  . Smoking status: Former Smoker -- 0.50 packs/day for 23 years    Types: Cigarettes    Quit date: 12/12/2011  . Smokeless tobacco: Never Used  . Alcohol Use: No  . Drug Use: No  . Sexual Activity: Not Currently   Other Topics Concern  . Not on file   Social History Narrative   ** Merged History Encounter **        Family History  Problem Relation Age of Onset  . Coronary artery disease Father 38    ROS:  Neck pain but no fevers or chills, productive cough, hemoptysis, dysphasia, odynophagia, melena, hematochezia, dysuria, hematuria, rash, seizure activity, orthopnea, PND, pedal edema, claudication. Remaining systems are negative.  Physical Exam:   There were no vitals taken for this visit.  General:  Well developed/well nourished in NAD Skin warm/dry Patient not depressed No peripheral clubbing Back-normal HEENT-normal/normal eyelids Neck supple/normal carotid upstroke bilaterally; no bruits; no JVD; no thyromegaly chest - CTA/ normal expansion CV - RRR/normal S1 and S2; no rubs or gallops;  PMI nondisplaced, 1/6 systolic ejection murmur Abdomen -NT/ND, no HSM, no mass, + bowel sounds, no bruit 2+ femoral pulses, no bruits Ext-no edema, chords, 2+ DP Neuro-grossly nonfocal  ECG sinus rhythm no ST changes.  Laboratories from [place] show BUN 21 and creatinine 0.89. Potassium 3.9. Chest x-ray without acute disease. Hemoglobin 13.5 and platelet count 266. Initial troponin negative. Assessment/Plan 1 chest pain-symptoms are atypical. Initial enzymes negative. Electrocardiogram with no ST changes. Plan cycle enzymes. If negative nuclear study tomorrow morning for risk stratification. Continue aspirin,  statin, beta blocker, calcium blocker and nitrates. 2 hypertension-continue preadmission medications and follow. 3 fibromyalgia 4 history of gastroesophageal reflux disease  Kirk Ruths MD 11/10/2013, 5:56 PM

## 2013-11-11 ENCOUNTER — Encounter (HOSPITAL_COMMUNITY): Payer: PRIVATE HEALTH INSURANCE

## 2013-11-11 ENCOUNTER — Encounter (HOSPITAL_COMMUNITY): Payer: PRIVATE HEALTH INSURANCE | Attending: Cardiology

## 2013-11-11 ENCOUNTER — Observation Stay (HOSPITAL_COMMUNITY): Payer: PRIVATE HEALTH INSURANCE

## 2013-11-11 ENCOUNTER — Encounter (HOSPITAL_COMMUNITY): Payer: Self-pay | Admitting: Physician Assistant

## 2013-11-11 DIAGNOSIS — I252 Old myocardial infarction: Secondary | ICD-10-CM

## 2013-11-11 DIAGNOSIS — R079 Chest pain, unspecified: Secondary | ICD-10-CM

## 2013-11-11 DIAGNOSIS — I10 Essential (primary) hypertension: Secondary | ICD-10-CM

## 2013-11-11 DIAGNOSIS — I2510 Atherosclerotic heart disease of native coronary artery without angina pectoris: Secondary | ICD-10-CM

## 2013-11-11 LAB — BASIC METABOLIC PANEL
BUN: 18 mg/dL (ref 6–23)
CALCIUM: 9.5 mg/dL (ref 8.4–10.5)
CO2: 28 mEq/L (ref 19–32)
Chloride: 106 mEq/L (ref 96–112)
Creatinine, Ser: 0.92 mg/dL (ref 0.50–1.10)
GFR calc non Af Amer: 65 mL/min — ABNORMAL LOW (ref >90)
GFR, EST AFRICAN AMERICAN: 75 mL/min — AB (ref >90)
Glucose, Bld: 100 mg/dL — ABNORMAL HIGH (ref 70–99)
Potassium: 4.6 mEq/L (ref 3.7–5.3)
SODIUM: 146 meq/L (ref 137–147)

## 2013-11-11 LAB — CBC
HCT: 38.9 % (ref 36.0–46.0)
Hemoglobin: 12.9 g/dL (ref 12.0–15.0)
MCH: 30.9 pg (ref 26.0–34.0)
MCHC: 33.2 g/dL (ref 30.0–36.0)
MCV: 93.1 fL (ref 78.0–100.0)
PLATELETS: 235 10*3/uL (ref 150–400)
RBC: 4.18 MIL/uL (ref 3.87–5.11)
RDW: 12.7 % (ref 11.5–15.5)
WBC: 4.7 10*3/uL (ref 4.0–10.5)

## 2013-11-11 LAB — TROPONIN I: Troponin I: <0.3 ng/mL (ref <0.30)

## 2013-11-11 MED ORDER — NITROGLYCERIN 0.4 MG SL SUBL: 0.4000 mg | SUBLINGUAL_TABLET | SUBLINGUAL | Status: DC | PRN

## 2013-11-11 MED ORDER — TECHNETIUM TC 99M SESTAMIBI GENERIC - CARDIOLITE
10.0000 | Freq: Once | INTRAVENOUS | Status: AC | PRN
  Administered 2013-11-11: 10 via INTRAVENOUS

## 2013-11-11 MED ORDER — TECHNETIUM TC 99M SESTAMIBI GENERIC - CARDIOLITE
30.0000 | Freq: Once | INTRAVENOUS | Status: AC | PRN
  Administered 2013-11-11: 30 via INTRAVENOUS

## 2013-11-11 NOTE — Progress Notes (Signed)
I have seen and evaluated the patient this PM along with Kerin Ransom, PA. I agree with his findings, examination as well as impression recommendations.  She was seen following Myoview.   I agree that Chest discomfort was both atypical & typical -- resting chest pain (upper sternal) & exertional dyspnea. She does have h/o NSTEMI.  Will await Myoview results. On ASA, Statin, BB & CCB + nitrate - NTG did help chest pressure  BP stable.    If Myoview Negative, anticipate d/c with ROV to see Dr. Mare Ferrari.  [name], M.D., M.S. [facility] [address] [place], [place]  [address]  [phone] Pager # [phone] 11/11/2013 2:59 PM

## 2013-11-11 NOTE — Discharge Instructions (Signed)
Chest Pain (Nonspecific) °It is often hard to give a specific diagnosis for the cause of chest pain. There is always a chance that your pain could be related to something serious, such as a heart attack or a blood clot in the lungs. You need to follow up with your caregiver for further evaluation. °CAUSES  °· Heartburn. °· Pneumonia or bronchitis. °· Anxiety or stress. °· Inflammation around your heart (pericarditis) or lung (pleuritis or pleurisy). °· A blood clot in the lung. °· A collapsed lung (pneumothorax). It can develop suddenly on its own (spontaneous pneumothorax) or from injury (trauma) to the chest. °· Shingles infection (herpes zoster virus). °The chest wall is composed of bones, muscles, and cartilage. Any of these can be the source of the pain. °· The bones can be bruised by injury. °· The muscles or cartilage can be strained by coughing or overwork. °· The cartilage can be affected by inflammation and become sore (costochondritis). °DIAGNOSIS  °Lab tests or other studies, such as X-rays, electrocardiography, stress testing, or cardiac imaging, may be needed to find the cause of your pain.  °TREATMENT  °· Treatment depends on what may be causing your chest pain. Treatment may include: °· Acid blockers for heartburn. °· Anti-inflammatory medicine. °· Pain medicine for inflammatory conditions. °· Antibiotics if an infection is present. °· You may be advised to change lifestyle habits. This includes stopping smoking and avoiding alcohol, caffeine, and chocolate. °· You may be advised to keep your head raised (elevated) when sleeping. This reduces the chance of acid going backward from your stomach into your esophagus. °· Most of the time, nonspecific chest pain will improve within 2 to 3 days with rest and mild pain medicine. °HOME CARE INSTRUCTIONS  °· If antibiotics were prescribed, take your antibiotics as directed. Finish them even if you start to feel better. °· For the next few days, avoid physical  activities that bring on chest pain. Continue physical activities as directed. °· Do not smoke. °· Avoid drinking alcohol. °· Only take over-the-counter or prescription medicine for pain, discomfort, or fever as directed by your caregiver. °· Follow your caregiver's suggestions for further testing if your chest pain does not go away. °· Keep any follow-up appointments you made. If you do not go to an appointment, you could develop lasting (chronic) problems with pain. If there is any problem keeping an appointment, you must call to reschedule. °SEEK MEDICAL CARE IF:  °· You think you are having problems from the medicine you are taking. Read your medicine instructions carefully. °· Your chest pain does not go away, even after treatment. °· You develop a rash with blisters on your chest. °SEEK IMMEDIATE MEDICAL CARE IF:  °· You have increased chest pain or pain that spreads to your arm, neck, jaw, back, or abdomen. °· You develop shortness of breath, an increasing cough, or you are coughing up blood. °· You have severe back or abdominal pain, feel nauseous, or vomit. °· You develop severe weakness, fainting, or chills. °· You have a fever. °THIS IS AN EMERGENCY. Do not wait to see if the pain will go away. Get medical help at once. Call your local emergency services (911 in [place] Do not drive yourself to the hospital. °MAKE SURE YOU:  °· Understand these instructions. °· Will watch your condition. °· Will get help right away if you are not doing well or get worse. °Document Released: 07/04/2005 Document Revised: 12/17/2011 Document Reviewed: 04/29/2008 °[employer]® Patient Information ©2014 [employer]   [employer]. ° °

## 2013-11-11 NOTE — Progress Notes (Signed)
UR completed 

## 2013-11-11 NOTE — Discharge Summary (Signed)
Discharge Summary   Patient ID: Jenna Hernandez MRN: [id number], DOB/AGE: 64-15-1952 64 y.o. Admit date: 11/10/2013 D/C date:     11/11/2013  Primary Care Provider: Manon Hilding, MD Primary Cardiologist: Mare Ferrari   Primary Discharge Diagnoses:  1. Chest pain, atypical - nuclear stress test normal this admission 2. CAD - history of NSTEMI 12/2011 with cath showing smooth/normal cors, ? MI secondary to possible transient spasm with thrombosis since she did have some hypokinesis in the mid inferior wall the overall ejection fraction was 65%, (? versus small branch occlusion off the RCA) 3. HTN 4. Fibromyalgia 5. GERD  Secondary Discharge Diagnoses:  1. Chronic back pain 2. Hiatal hernia 3. Osteoarthritis 4. Jaw pain 5. Anxiety  Hospital Course: Jenna Hernandez is a 63 y/o F with history of non-STEMI on 12/13/11. She underwent cardiac catheterization which showed smooth and normal coronary arteries it was felt that she had a myocardial infarction secondary to possible transient spasm with thrombosis since she did have some hypokinesis in the mid inferior wall the overall ejection fraction was 65%. She was readmitted to the hospital on 12/26/11 with chest pain and was discharged the following day after ruling out. During her readmission her initial cardiac catheter films were reviewed by the admitting physician Dr. Fletcher Anon who felt that she might have had a small branch occlusion of off the right coronary artery to explain her wall motion abnormalities. The patient was hospitalized again briefly 07/21/13 until 07/23/13. She ruled out for myocardial infarction. She presented with left neck pain. Her amlodipine was increased from 5 mg to 10 mg to treat possible vascular spasm. She was last seen by Dr. Mare Ferrari on 08/06/13.   She presented to [facility]/10/2013 with complaints of recurrent chest pain. Her symptoms started 4 days ago. She was walking across the parking lot and felt a "pound". Since then she has  had intermittent chest tightness. It was in the mid upper chest without radiation. Pain was not pleuritic or positional. Some symptoms occurred with exertion and some at rest. Symptoms were more prolonged on day of admission and relieved with nitroglycerin. She also described dyspnea on exertion but no orthopnea, PND or pedal edema.She was not tachycardic, tachypnic or hypoxic. She was admitted for further eval and ruled out for MI. She underwent nuclear stress testing today that was normal; EF 81% and normal wall motion. Symptoms were felt atypical. Dr. Ellyn Hack has seen and examined the patient today and feels she is stable for discharge. She will continue home medications and f/u with Dr. Mare Ferrari.   Discharge Vitals: Blood pressure 129/40, pulse 67, temperature 98.6 F (37 C), temperature source Oral, resp. rate 18, height 5\' 3"  (1.6 m), weight 173 lb 15.1 oz (78.9 kg), SpO2 94.00%.  Labs: Lab Results  Component Value Date   WBC 4.7 11/11/2013   HGB 12.9 11/11/2013   HCT 38.9 11/11/2013   MCV 93.1 11/11/2013   PLT 235 11/11/2013     Recent Labs Lab 11/11/13 0724  NA 146  K 4.6  CL 106  CO2 28  BUN 18  CREATININE 0.92  CALCIUM 9.5  GLUCOSE 100*    Recent Labs  11/10/13 2022 11/11/13 0006 11/11/13 0724  TROPONINI <0.30 <0.30 <0.30   Lab Results  Component Value Date   CHOL 159 12/13/2011   HDL 62 12/13/2011   LDLCALC 74 12/13/2011   TRIG 116 12/13/2011    Diagnostic Studies/Procedures   Nm Myocar Multi W/spect W/wall Motion / Ef  11/11/2013  CLINICAL DATA:  63 yo female with chest pain.  EXAM: MYOCARDIAL IMAGING WITH SPECT (REST AND EXERCISE)  GATED LEFT VENTRICULAR WALL MOTION STUDY  LEFT VENTRICULAR EJECTION FRACTION  TECHNIQUE: Standard myocardial SPECT imaging was performed after resting intravenous injection of 10 mCi Tc-39m sestamibi. Subsequently, exercise tolerance test was performed by the patient under the supervision of the Cardiology staff. At peak-stress, 30 mCi Tc-66m  sestamibi was injected intravenously and standard myocardial SPECT imaging was performed. Quantitative gated imaging was also performed to evaluate left ventricular wall motion, and estimate left ventricular ejection fraction.  COMPARISON:  None.  FINDINGS: The patient exercised for 3:45 on the bruce protocol; no chest pain or ECG changes; normal BP response. Images reconstructed in the short axix and vertical and horizontal long axis. The stress images show normal perfusion. TID-0.87; EDV-42 ml; ESV-30ml; gated EF 81% and normal wall motion.  IMPRESSION: Normal stress nuclear study with no chest pain, no ST changes and no ischemia or infarction; gated EF 81% and normal wall motion.   Electronically Signed   By: Phill Mutter   On: 11/11/2013 16:30    Discharge Medications     Medication List         acetaminophen 500 MG tablet  Commonly known as:  TYLENOL  Take 1,000 mg by mouth every 6 (six) hours as needed (pain).     ALPRAZolam 0.25 MG tablet  Commonly known as:  XANAX  Take 0.125 mg by mouth at bedtime as needed for anxiety.     amLODipine 5 MG tablet  Commonly known as:  NORVASC  Take 1 tablet (5 mg total) by mouth daily.     aspirin EC 81 MG tablet  Take 81 mg by mouth at bedtime.     isosorbide mononitrate 30 MG 24 hr tablet  Commonly known as:  IMDUR  Take 1 tablet (30 mg total) by mouth daily.     losartan 25 MG tablet  Commonly known as:  COZAAR  Take 1 tablet (25 mg total) by mouth daily.     metoprolol tartrate 25 MG tablet  Commonly known as:  LOPRESSOR  Take 12.5 mg by mouth 2 (two) times daily.     nitroGLYCERIN 0.4 MG SL tablet  Commonly known as:  NITROSTAT  Place 1 tablet (0.4 mg total) under the tongue every 5 (five) minutes x 3 doses as needed for chest pain.     pantoprazole 40 MG tablet  Commonly known as:  PROTONIX  Take 40 mg by mouth 2 (two) times daily.     pravastatin 20 MG tablet  Commonly known as:  PRAVACHOL  Take 20 mg by mouth every  evening.     ranitidine 150 MG tablet  Commonly known as:  ZANTAC  Take 1 tablet (150 mg total) by mouth 2 (two) times daily as needed for heartburn.     Vitamin D (Ergocalciferol) 50000 UNITS Caps capsule  Commonly known as:  DRISDOL  Take 50,000 Units by mouth every 7 (seven) days.        Disposition   The patient will be discharged in stable condition to home.  Future Appointments Provider Department Dept Phone   12/02/2013 8:30 AM Darlin Coco, MD [place] [facility] [phone]     Follow-up Information   Follow up with Darlin Coco, MD On 12/02/2013. (8:30)    Specialty:  Cardiology   Contact information:   [address] [place] [address] [phone]  Duration of Discharge Encounter: Greater than 30 minutes including physician and PA time.  Signed, Melina Copa PA-C 11/11/2013, 4:55 PM

## 2013-11-11 NOTE — Progress Notes (Addendum)
Work note requested by patient to specifically include the following info with her permission. See below.      Please excuse Jenna Hernandez from work 2/3-11/11/13 because she was in the hospital for evaluation of chest pain. She may return to work 11/12/13.   Dayna Dunn PA-C [phone]

## 2013-11-11 NOTE — Progress Notes (Signed)
Subjective:  Chest pain improved with NTG. Troponin negative x 3.  Objective:  Vital Signs in the last 24 hours: Temp:  [97.7 F (36.5 C)-98.4 F (36.9 C)] 98.1 F (36.7 C) (02/04 0736) Pulse Rate:  [57-96] 59 (02/04 0736) Resp:  [11-23] 20 (02/04 0736) BP: (87-142)/(45-80) 131/76 mmHg (02/04 0911) SpO2:  [95 %-98 %] 98 % (02/04 0736) Weight:  [173 lb 15.1 oz (78.9 kg)] 173 lb 15.1 oz (78.9 kg) (02/03 1800)  Intake/Output from previous day:  Intake/Output Summary (Last 24 hours) at 11/11/13 0948 Last data filed at 11/11/13 0000  Gross per 24 hour  Intake     58 ml  Output    300 ml  Net   -242 ml    Physical Exam: General appearance: alert, cooperative, no distress and mildly obese Lungs: clear to auscultation bilaterally Heart: regular rate and rhythm   Rate: 70  Rhythm: normal sinus rhythm  Lab Results:  Recent Labs  11/10/13 2022 11/11/13 0724  WBC 6.3 4.7  HGB 12.6 12.9  PLT 243 235    Recent Labs  11/10/13 2022 11/11/13 0724  NA  --  146  K  --  4.6  CL  --  106  CO2  --  28  GLUCOSE  --  100*  BUN  --  18  CREATININE 0.81 0.92    Recent Labs  11/11/13 0006 11/11/13 0724  TROPONINI <0.30 <0.30   No results found for this basename: INR,  in the last 72 hours  Imaging: No results found.  Cardiac Studies:  Assessment/Plan:   Active Problems:   1 chest pain-symptoms are atypical. Initial enzymes negative. Electrocardiogram with no ST changes. Plan cycle enzymes. If negative nuclear study tomorrow morning for risk stratification. Continue aspirin, statin, beta blocker, calcium blocker and nitrates.  2 hypertension-continue preadmission medications and follow.  3 fibromyalgia  4 history of gastroesophageal reflux disease     PLAN: GXT Myoview this am.  Kerin Ransom PA-C Beeper [phone] 11/11/2013, 9:48 AM

## 2013-11-12 MED FILL — Nitroglycerin IV Soln 200 MCG/ML in D5W: INTRAVENOUS | Qty: 250 | Status: AC

## 2013-11-12 NOTE — Discharge Summary (Signed)
Admitted for SSx of Chest pressure & DOE - moderate risk for Cardiac Etiology - Myoview was negative for ischemia. D/c with plan to ROV with Dr. Mare Ferrari.  [name], MD

## 2013-12-02 ENCOUNTER — Encounter: Payer: Self-pay | Admitting: Nurse Practitioner

## 2013-12-02 ENCOUNTER — Encounter: Payer: Self-pay | Admitting: Cardiology

## 2013-12-09 ENCOUNTER — Encounter: Payer: Self-pay | Admitting: Physician Assistant

## 2013-12-09 ENCOUNTER — Ambulatory Visit (INDEPENDENT_AMBULATORY_CARE_PROVIDER_SITE_OTHER): Payer: PRIVATE HEALTH INSURANCE | Admitting: Physician Assistant

## 2013-12-09 VITALS — BP 122/72 | HR 69 | Ht 63.0 in | Wt 171.0 lb

## 2013-12-09 DIAGNOSIS — E669 Obesity, unspecified: Secondary | ICD-10-CM

## 2013-12-09 DIAGNOSIS — F411 Generalized anxiety disorder: Secondary | ICD-10-CM

## 2013-12-09 DIAGNOSIS — R079 Chest pain, unspecified: Secondary | ICD-10-CM

## 2013-12-09 DIAGNOSIS — F419 Anxiety disorder, unspecified: Secondary | ICD-10-CM

## 2013-12-09 DIAGNOSIS — I10 Essential (primary) hypertension: Secondary | ICD-10-CM

## 2013-12-09 NOTE — Assessment & Plan Note (Signed)
Patient is having panic attacks and suffers from depression. She has refused treatment in the past. She has an appointment to see Dr. Quintin Alto back in followup.

## 2013-12-09 NOTE — Patient Instructions (Signed)
Your physician recommends that you keep your follow-up appointment 01/27/14 AT 10:45 with Dr. Mare Ferrari  Your physician recommends that you continue on your current medications as directed. Please refer to the Current Medication list given to you today.

## 2013-12-09 NOTE — Progress Notes (Signed)
HPI:  This is a 63 year old female patient Dr. Mare Ferrari who was recently admitted to the hospital with chest pain and ruled out for an MI. She has stress Myoview that was normal EF 81% and normal wall motion. Her symptoms were felt to be atypical. She does have history of coronary artery disease status post non-ST elevation MI on 12/13/11 which showed smooth and normal coronary arteries and it was felt to 2 transient spasm with thrombosis since she did have hypokinesis of the mid Imdur wall EF 65% that time. She had a readmission on 12/26/11 with chest pain and repeat cardiac cath showed small branch occlusion off of the right coronary artery to explain her wall motion abnormalities.  The patient hasn't had any further chest pain. She has chronic aches and pains secondary to arthritis and fibromyalgia. She also is suffering from depression and panic attacks. She is under a lot of stress at work and is thinking about going part time or possibly retiring. She says she feels lonely and would like to lose weight. She's gained over 35 pounds since her MI. She sees Dr. Quintin Alto next month and will discuss these things with him. She has refused antidepressants in the past.  Allergies -- Diclofenac Sodium -- Anaphylaxis   --  Tolerates aspirin - was told not to take            antiinflammatory meds.  -- Meperidine-Promethazine    --  headache  -- Ramipril    --  cough  -- Raspberry -- Hives  -- Codeine -- Rash  -- Prednisone -- Palpitations  -- Sulfa Antibiotics -- Rash  Current Outpatient Prescriptions on File Prior to Visit: acetaminophen (TYLENOL) 500 MG tablet, Take 1,000 mg by mouth every 6 (six) hours as needed (pain)., Disp: , Rfl:  ALPRAZolam (XANAX) 0.25 MG tablet, Take 0.125 mg by mouth at bedtime as needed for anxiety., Disp: , Rfl:  amLODipine (NORVASC) 5 MG tablet, Take 1 tablet (5 mg total) by mouth daily., Disp: 30 tablet, Rfl: 11 aspirin EC 81 MG tablet, Take 81 mg by mouth at bedtime.,  Disp: , Rfl:  isosorbide mononitrate (IMDUR) 30 MG 24 hr tablet, Take 1 tablet (30 mg total) by mouth daily., Disp: 30 tablet, Rfl: 6 losartan (COZAAR) 25 MG tablet, Take 1 tablet (25 mg total) by mouth daily., Disp: 30 tablet, Rfl: 11 metoprolol tartrate (LOPRESSOR) 25 MG tablet, Take 12.5 mg by mouth 2 (two) times daily., Disp: , Rfl:  nitroGLYCERIN (NITROSTAT) 0.4 MG SL tablet, Place 1 tablet (0.4 mg total) under the tongue every 5 (five) minutes as needed for chest pain (up to 3 doses)., Disp: 25 tablet, Rfl: 3 pantoprazole (PROTONIX) 40 MG tablet, Take 40 mg by mouth 2 (two) times daily. , Disp: , Rfl:  pravastatin (PRAVACHOL) 20 MG tablet, Take 20 mg by mouth every evening., Disp: , Rfl:  ranitidine (ZANTAC) 150 MG tablet, Take 1 tablet (150 mg total) by mouth 2 (two) times daily as needed for heartburn., Disp: , Rfl:  Vitamin D, Ergocalciferol, (DRISDOL) 50000 UNITS CAPS capsule, Take 50,000 Units by mouth every 7 (seven) days., Disp: , Rfl:   No current facility-administered medications on file prior to visit.   Past Medical History:   GERD (gastroesophageal reflux disease)                       Fibromyalgia  NSTEMI (non-ST elevated myocardial infarction)                 Comment:a. NSTEMI 12/2011 with cath showing               smooth/normal cors, ? MI secondary to possible               transient spasm with thrombosis since she did               have some hypokinesis in the mid inferior wall               the overall ejection fraction was 65%, (?               versus small branch occlusion off the RCA). b.               Nuc 11/2013 - normal.   Anxiety                                                      Jaw pain                                                       Comment:Has been told she has bone loss by dentist.   Osteoarthritis                                               Hypertension                                                  H/O hiatal hernia                                            Chronic back pain                                              Comment:"from the neck down" (07/21/2013)  Past Surgical History:   CHOLECYSTECTOMY                                  ~ 2004       INGUINAL HERNIA REPAIR                          Right ~ 1983         Comment:"femoral" (07/21/2013)   [place]  TUBAL LIGATION                                   1979         CYSTECTOMY                                       ?38's        Comment:"from my back" (07/21/2013)   CARDIAC CATHETERIZATION                          12/2011      Review of patient's family history indicates:   Coronary artery disease        Father                   Social History   Marital Status: Divorced            Spouse Name:                      Years of Education:                 Number of children:             Occupational History Occupation          Employer            Comment                                                      [employer] -                                          registration                                          specialist.  Social History Main Topics   Smoking Status: Former Smoker                   Packs/Day: 0.50  Years: 23        Types: Cigarettes     Quit date: 12/12/2011   Smokeless Status: Never Used                       Alcohol Use: No             Drug Use: No             Sexual Activity: Not Currently      Other Topics            Concern   None on file  Social History Narrative  Merged History Encounter      ROS:  See history of present illness otherwise negative   PHYSICAL EXAM: Well-nournished, in no acute distress. Neck: No JVD, HJR, Bruit, or thyroid enlargement  Lungs: No tachypnea, clear without wheezing, rales, or rhonchi  Cardiovascular: RRR, PMI not displaced, heart sounds normal,  no murmurs, gallops, bruit, thrill, or heave.  Abdomen: BS normal. Soft without organomegaly, masses, lesions or tenderness.  Extremities: without cyanosis, clubbing or edema. Good distal pulses bilateral  SKin: Warm, no lesions or rashes   Musculoskeletal: No deformities  Neuro: no focal signs  BP 122/72  Pulse 69  Ht 5\' 3"  (1.6 m)  Wt 171 lb (77.565 kg)  BMI 30.30 kg/m2   EKG: Normal sinus rhythm no acute change

## 2013-12-09 NOTE — Assessment & Plan Note (Signed)
Discussed weight watchers with the patient and recommend exercise program as well.

## 2013-12-09 NOTE — Assessment & Plan Note (Signed)
Patient had a recent hospitalization with chest pain and MI ruled out. Stress Myoview was normal. Most her symptoms were atypical and she is under a lot of stress. She's had no further chest pain.

## 2013-12-09 NOTE — Assessment & Plan Note (Signed)
Stable

## 2014-01-27 ENCOUNTER — Ambulatory Visit (INDEPENDENT_AMBULATORY_CARE_PROVIDER_SITE_OTHER): Payer: PRIVATE HEALTH INSURANCE | Admitting: Cardiology

## 2014-01-27 ENCOUNTER — Encounter: Payer: Self-pay | Admitting: Cardiology

## 2014-01-27 VITALS — BP 120/62 | HR 68 | Ht 63.0 in | Wt 172.0 lb

## 2014-01-27 DIAGNOSIS — I252 Old myocardial infarction: Secondary | ICD-10-CM

## 2014-01-27 DIAGNOSIS — E669 Obesity, unspecified: Secondary | ICD-10-CM

## 2014-01-27 DIAGNOSIS — M797 Fibromyalgia: Secondary | ICD-10-CM

## 2014-01-27 DIAGNOSIS — I2510 Atherosclerotic heart disease of native coronary artery without angina pectoris: Secondary | ICD-10-CM

## 2014-01-27 DIAGNOSIS — IMO0001 Reserved for inherently not codable concepts without codable children: Secondary | ICD-10-CM

## 2014-01-27 DIAGNOSIS — I119 Hypertensive heart disease without heart failure: Secondary | ICD-10-CM

## 2014-01-27 NOTE — Patient Instructions (Signed)
Your physician recommends that you continue on your current medications as directed. Please refer to the Current Medication list given to you today.  Your physician wants you to follow-up in: 6 month ov/ekg You will receive a reminder letter in the mail two months in advance. If you don't receive a letter, please call our office to schedule the follow-up appointment.  

## 2014-01-27 NOTE — Assessment & Plan Note (Signed)
She has a history of fibromyalgia.  Her PCP Dr. Ella Jubilee has given her a small dose of Valium generic 2 mg at bedtime.  She sometimes takes just half of a tablet.  She has been resting better at night and her fibromyalgia seems better the next day

## 2014-01-27 NOTE — Assessment & Plan Note (Signed)
The patient is discouraged that she has not been successful in losing weight.  I have encouraged her to continue to stick with a low carbohydrate low sugar diet and to try to increase her aerobic exercise with walking et Ronney Asters.  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

## 2014-01-27 NOTE — Assessment & Plan Note (Signed)
Since her last hospital admission in February 20 15th she has had very little chest discomfort.  She had also been experiencing some intermittent jaw pain which in retrospect was probably secondary to dental problems and has improved since she started wearing her retainer at night.  She suspects she may be grinding her teeth otherwise.

## 2014-01-27 NOTE — Progress Notes (Signed)
Earlie Server Date of Birth:  July 30, 1951 [address] [place], [place]  [address] [phone]  Fax   [phone]  HPI: This pleasant 63 year old woman is seen for a scheduled followup office visit.  She has a history of recurrent chest pains.  She was most recently admitted on 11/10/13 for observation for chest pain.  The following day she had a Myoview stress test which showed no ischemia and her ejection fraction was 81%.  Several months ago she was admitted overnight for observation at [facility] for chest pain and she ruled out. Her EKG and enzymes and chest x-ray were normal and she was discharged the next day. She has a history of a non-STEMI on 12/13/11. She underwent cardiac catheterization which showed smooth and normal coronary arteries it was felt that she had a myocardial infarction secondary to possible transient spasm with thrombosis since she did have some hypokinesis in the mid inferior wall the overall ejection fraction was 65%. She was readmitted to the hospital on 12/26/11 with chest pain and was discharged the following day after ruling out. During her readmission her initial cardiac catheter films were reviewed by the admitting physician Dr. Fletcher Anon who felt that she might have had a small branch occlusion of off the right coronary artery to explain her wall motion abnormalities. Patient has been doing well with no recurrent chest pain. She has had some bilateral jaw pain and recently saw her dentist who diagnosed a problem with some bone loss. The patient admits to being under a lot of stress. She has stress at home and at work. Her work consists of sitting at a [employer] all day and she is not getting any regular exercise.  The patient was hospitalized briefly 07/21/13 until 07/23/13.  She ruled out for myocardial infarction.  She presented with left neck pain.  Her amlodipine was increased from 5 mg to 10 mg to treat possible vascular  spasm.   Current Outpatient Prescriptions  Medication Sig Dispense Refill  . acetaminophen (TYLENOL) 500 MG tablet Take 1,000 mg by mouth every 6 (six) hours as needed (pain).      Marland Kitchen ALPRAZolam (XANAX) 0.25 MG tablet Take 0.125 mg by mouth at bedtime as needed for anxiety.      Marland Kitchen amLODipine (NORVASC) 5 MG tablet Take 1 tablet (5 mg total) by mouth daily.  30 tablet  11  . aspirin EC 81 MG tablet Take 81 mg by mouth at bedtime.      . isosorbide mononitrate (IMDUR) 30 MG 24 hr tablet Take 1 tablet (30 mg total) by mouth daily.  30 tablet  6  . losartan (COZAAR) 25 MG tablet Take 1 tablet (25 mg total) by mouth daily.  30 tablet  11  . metoprolol tartrate (LOPRESSOR) 25 MG tablet Take 12.5 mg by mouth 2 (two) times daily.      . nitroGLYCERIN (NITROSTAT) 0.4 MG SL tablet Place 1 tablet (0.4 mg total) under the tongue every 5 (five) minutes as needed for chest pain (up to 3 doses).  25 tablet  3  . pantoprazole (PROTONIX) 40 MG tablet Take 40 mg by mouth 2 (two) times daily.       . pravastatin (PRAVACHOL) 20 MG tablet Take 20 mg by mouth every evening.      . ranitidine (ZANTAC) 150 MG tablet Take 1 tablet (150 mg total) by mouth 2 (two) times daily as needed for heartburn.  No current facility-administered medications for this visit.    Allergies  Allergen Reactions  . Diclofenac Sodium Anaphylaxis    Tolerates aspirin - was told not to take antiinflammatory meds.  . Meperidine-Promethazine     headache  . Ramipril     cough  . Raspberry Hives  . Codeine Rash  . Prednisone Palpitations  . Sulfa Antibiotics Rash    Patient Active Problem List   Diagnosis Date Noted  . Obesity (BMI 30-39.9) 12/09/2013  . Normal coronary arteries-2013 11/11/2013  . Chest pain with moderate risk of acute coronary syndrome 11/10/2013  . HTN (hypertension) 08/06/2013  . Neck pain 07/21/2013  . Fibromyalgia 07/21/2013  . Osteoarthritis 03/17/2013  . Low back pain 11/28/2012  . Cough due to  ACE inhibitor 04/04/2012  . Anxiety 01/01/2012  . CAD (coronary artery disease) 01/01/2012  . GERD (gastroesophageal reflux disease) 01/01/2012  . NSTEMI 7/13 (NL cors) 12/13/2011    History  Smoking status  . Former Smoker -- 0.50 packs/day for 23 years  . Types: Cigarettes  . Quit date: 12/12/2011  Smokeless tobacco  . Never Used    History  Alcohol Use No    Family History  Problem Relation Age of Onset  . Coronary artery disease Father 17    Review of Systems: The patient denies any heat or cold intolerance.  No weight gain or weight loss.  The patient denies headaches or blurry vision.  There is no cough or sputum production.  The patient denies dizziness.  There is no hematuria or hematochezia.  The patient denies any muscle aches or arthritis.  The patient denies any rash.  The patient denies frequent falling or instability.  There is no history of depression or anxiety.  All other systems were reviewed and are negative.   Physical Exam: Filed Vitals:   01/27/14 1103  BP: 120/62  Pulse: 68   the general appearance reveals a well-developed well-nourished woman in no distress.The head and neck exam reveals pupils equal and reactive.  Extraocular movements are full.  There is no scleral icterus.  The mouth and pharynx are normal.  The neck is supple.  The carotids reveal no bruits.  The jugular venous pressure is normal.  The  thyroid is not enlarged.  There is no lymphadenopathy.  The chest is clear to percussion and auscultation.  There are no rales or rhonchi.  Expansion of the chest is symmetrical.  The precordium is quiet.  The first heart sound is normal.  The second heart sound is physiologically split.  There is no murmur gallop rub or click.  There is no abnormal lift or heave.  The abdomen is soft and nontender.  The bowel sounds are normal.  The liver and spleen are not enlarged.  There are no abdominal masses.  There are no abdominal bruits.  Extremities reveal good  pedal pulses.  There is no phlebitis .  There is bilateral pedal edema.   There is no cyanosis or clubbing.  Strength is normal and symmetrical in all extremities.  There is no lateralizing weakness.  There are no sensory deficits.  The skin is warm and dry.  There is no rash.    Assessment / Plan: The patient is to continue current medication.  Encouraged her to work harder on weight loss and regular exercise program including walking.  She will be rechecked in 6 months for office visit and EKG.

## 2014-01-30 ENCOUNTER — Other Ambulatory Visit: Payer: Self-pay | Admitting: Cardiology

## 2014-02-27 ENCOUNTER — Other Ambulatory Visit: Payer: Self-pay | Admitting: Cardiology

## 2014-03-26 ENCOUNTER — Other Ambulatory Visit: Payer: Self-pay | Admitting: Cardiology

## 2014-05-11 ENCOUNTER — Other Ambulatory Visit: Payer: Self-pay | Admitting: *Deleted

## 2014-05-11 MED ORDER — METOPROLOL TARTRATE 25 MG PO TABS: 12.5000 mg | ORAL_TABLET | Freq: Two times a day (BID) | ORAL | Status: DC

## 2014-07-30 ENCOUNTER — Encounter: Payer: Self-pay | Admitting: Cardiology

## 2014-07-30 ENCOUNTER — Ambulatory Visit (INDEPENDENT_AMBULATORY_CARE_PROVIDER_SITE_OTHER): Payer: PRIVATE HEALTH INSURANCE | Admitting: Cardiology

## 2014-07-30 VITALS — BP 122/60 | HR 79 | Ht 63.0 in | Wt 174.0 lb

## 2014-07-30 DIAGNOSIS — M797 Fibromyalgia: Secondary | ICD-10-CM

## 2014-07-30 DIAGNOSIS — I25119 Atherosclerotic heart disease of native coronary artery with unspecified angina pectoris: Secondary | ICD-10-CM

## 2014-07-30 DIAGNOSIS — I10 Essential (primary) hypertension: Secondary | ICD-10-CM

## 2014-07-30 MED ORDER — METOPROLOL TARTRATE 25 MG PO TABS: 12.5000 mg | ORAL_TABLET | Freq: Two times a day (BID) | ORAL | Status: DC

## 2014-07-30 MED ORDER — PRAVASTATIN SODIUM 20 MG PO TABS: ORAL_TABLET | ORAL | Status: DC

## 2014-07-30 MED ORDER — ISOSORBIDE MONONITRATE ER 30 MG PO TB24: ORAL_TABLET | ORAL | Status: DC

## 2014-07-30 MED ORDER — LOSARTAN POTASSIUM 25 MG PO TABS: 25.0000 mg | ORAL_TABLET | Freq: Every day | ORAL | Status: DC

## 2014-07-30 NOTE — Patient Instructions (Signed)
Your physician recommends that you continue on your current medications as directed. Please refer to the Current Medication list given to you today.  Your physician wants you to follow-up in: 6 month ov You will receive a reminder letter in the mail two months in advance. If you don't receive a letter, please call our office to schedule the follow-up appointment.  

## 2014-07-30 NOTE — Assessment & Plan Note (Signed)
Blood pressure has been remaining stable on current therapy.  She has not been successful in weight loss.  Her weight is up 2 pounds.  She had recent lab work at Dr. Edythe Hernandez office which showed triglycerides 273 and cholesterol 175 and blood sugar 110

## 2014-07-30 NOTE — Progress Notes (Signed)
Jenna Hernandez Date of Birth:  05/04/1951 Jenna Hernandez [address] [place], [place]  [address] [phone]        Fax   [phone]   History of Present Illness: This pleasant 63 year old woman is seen for a scheduled followup office visit. She has a history of recurrent chest pains. She was most recently admitted on 11/10/13 for observation for chest pain. The following day she had a Myoview stress test which showed no ischemia and her ejection fraction was 81%. Several months ago she was admitted overnight for observation at [facility] for chest pain and she ruled out. Her EKG and enzymes and chest x-ray were normal and she was discharged the next day. She has a history of a non-STEMI on 12/13/11. She underwent cardiac catheterization which showed smooth and normal coronary arteries it was felt that she had a myocardial infarction secondary to possible transient spasm with thrombosis since she did have some hypokinesis in the mid inferior wall the overall ejection fraction was 65%. She was readmitted to the hospital on 12/26/11 with chest pain and was discharged the following day after ruling out. During her readmission her initial cardiac catheter films were reviewed by the admitting physician Dr. Fletcher Anon who felt that she might have had a small branch occlusion of off the right coronary artery to explain her wall motion abnormalities. Patient has been doing well with no recurrent chest pain. She has had some bilateral jaw pain and recently saw her dentist who diagnosed a problem with some bone loss. The patient admits to being under a lot of stress. She has stress at home and at work. Her work consists of sitting at a [employer] all day and she is not getting any regular exercise.  The patient was hospitalized briefly 07/21/13 until 07/23/13. She ruled out for myocardial infarction. She presented with left neck pain. Her amlodipine was increased from 5 mg to 10 mg to  treat possible vascular spasm.   Current Outpatient Prescriptions  Medication Sig Dispense Refill  . acetaminophen (TYLENOL) 500 MG tablet Take 1,000 mg by mouth every 6 (six) hours as needed (pain).      Marland Kitchen amLODipine (NORVASC) 5 MG tablet Take 1 tablet (5 mg total) by mouth daily.  30 tablet  11  . aspirin EC 81 MG tablet Take 81 mg by mouth at bedtime.      . diazepam (VALIUM) 1 MG/ML solution Take 1 mg by mouth every 8 (eight) hours as needed for anxiety.      . isosorbide mononitrate (IMDUR) 30 MG 24 hr tablet TAKE ONE TABLET BY MOUTH ONCE DAILY  30 tablet  11  . losartan (COZAAR) 25 MG tablet Take 1 tablet (25 mg total) by mouth daily.  30 tablet  11  . metoprolol tartrate (LOPRESSOR) 25 MG tablet Take 0.5 tablets (12.5 mg total) by mouth 2 (two) times daily.  30 tablet  11  . nitroGLYCERIN (NITROSTAT) 0.4 MG SL tablet Place 1 tablet (0.4 mg total) under the tongue every 5 (five) minutes as needed for chest pain (up to 3 doses).  25 tablet  3  . pantoprazole (PROTONIX) 40 MG tablet Take 40 mg by mouth 2 (two) times daily.       . pravastatin (PRAVACHOL) 20 MG tablet TAKE ONE TABLET BY MOUTH ONCE DAILY  30 tablet  11  . ranitidine (ZANTAC) 150 MG tablet Take 1 tablet (150 mg total) by mouth 2 (two) times  daily as needed for heartburn.      . ALPRAZolam (XANAX) 0.25 MG tablet Take 0.125 mg by mouth at bedtime as needed for anxiety.       No current facility-administered medications for this visit.    Allergies  Allergen Reactions  . Diclofenac Sodium Anaphylaxis    Tolerates aspirin - was told not to take antiinflammatory meds.  . Meperidine-Promethazine     headache  . Ramipril     cough  . Raspberry Hives  . Codeine Rash  . Prednisone Palpitations  . Sulfa Antibiotics Rash    Patient Active Problem List   Diagnosis Date Noted  . Obesity (BMI 30-39.9) 12/09/2013  . Normal coronary arteries-2013 11/11/2013  . Chest pain with moderate risk of acute coronary syndrome  11/10/2013  . HTN (hypertension) 08/06/2013  . Neck pain 07/21/2013  . Fibromyalgia 07/21/2013  . Osteoarthritis 03/17/2013  . Low back pain 11/28/2012  . Cough due to ACE inhibitor 04/04/2012  . Anxiety 01/01/2012  . CAD (coronary artery disease) 01/01/2012  . GERD (gastroesophageal reflux disease) 01/01/2012  . NSTEMI 7/13 (NL cors) 12/13/2011    History  Smoking status  . Former Smoker -- 0.50 packs/day for 23 years  . Types: Cigarettes  . Quit date: 12/12/2011  Smokeless tobacco  . Never Used    History  Alcohol Use No    Family History  Problem Relation Age of Onset  . Coronary artery disease Father 32    Review of Systems: Constitutional: no fever chills diaphoresis or fatigue or change in weight.  Head and neck: no hearing loss, no epistaxis, no photophobia or visual disturbance. Respiratory: No cough, shortness of breath or wheezing. Cardiovascular: No chest pain peripheral edema, palpitations. Gastrointestinal: No abdominal distention, no abdominal pain, no change in bowel habits hematochezia or melena. Genitourinary: No dysuria, no frequency, no urgency, no nocturia. Musculoskeletal:No arthralgias, no back pain, no gait disturbance or myalgias. Neurological: No dizziness, no headaches, no numbness, no seizures, no syncope, no weakness, no tremors. Hematologic: No lymphadenopathy, no easy bruising. Psychiatric: No confusion, no hallucinations, no sleep disturbance.    Physical Exam: Filed Vitals:   07/30/14 1414  BP: 122/60  Pulse: 79   The patient appears to be in no distress.  Head and neck exam reveals that the pupils are equal and reactive.  The extraocular movements are full.  There is no scleral icterus.  Mouth and pharynx are benign.  No lymphadenopathy.  No carotid bruits.  The jugular venous pressure is normal.  Thyroid is not enlarged or tender.  Chest is clear to percussion and auscultation.  No rales or rhonchi.  Expansion of the chest is  symmetrical.  Heart reveals no abnormal lift or heave.  First and second heart sounds are normal.  There is no murmur gallop rub or click.  The abdomen is soft and nontender.  Bowel sounds are normoactive.  There is no hepatosplenomegaly or mass.  There are no abdominal bruits.  Extremities reveal no phlebitis or edema.  Pedal pulses are good.  There is no cyanosis or clubbing.  Neurologic exam is normal strength and no lateralizing weakness.  No sensory deficits.  Integument reveals no rash  EKG shows normal sinus rhythm and is within normal limits and there is no change since 12/09/13  Assessment / Plan: 1. history of non-STEMI with angiographically normal coronary arteries in 12/13/11 2. essential hypertension 3. Fibromyalgia 4. GERD 5.  Hypercholesterolemia  Disposition: Continue current medication.  Recheck in 6  months for followup office visit.

## 2014-07-30 NOTE — Assessment & Plan Note (Signed)
The patient continues to have a lot of problems from her fibromyalgia.  She is unable to tolerate nonsteroidal anti-inflammatory agents.  She takes Tylenol.

## 2014-07-30 NOTE — Assessment & Plan Note (Signed)
She continues to have occasional substernal chest discomfort sometimes radiating up to the left side of her neck.  She has dyspnea walking uphill from the parking lot to her office.  Her symptoms have not changed since last visit.  She has not been taking any sublingual nitroglycerin.

## 2014-08-26 ENCOUNTER — Other Ambulatory Visit: Payer: Self-pay | Admitting: Cardiology

## 2014-09-16 ENCOUNTER — Encounter (HOSPITAL_COMMUNITY): Payer: Self-pay | Admitting: Cardiovascular Disease

## 2015-01-13 ENCOUNTER — Telehealth: Payer: Self-pay | Admitting: Cardiology

## 2015-01-13 DIAGNOSIS — R002 Palpitations: Secondary | ICD-10-CM

## 2015-01-13 NOTE — Telephone Encounter (Signed)
Patient c/o Palpitations:  High priority if patient c/o lightheadedness and shortness of breath.  1. How long have you been having palpitations? (Heart Flutter) for the last few weeks but yesterday off and on all day and then it started up again this morning.. She is not sure if it is Chronic fatigue but it has her anxiety levels high   2. Are you currently experiencing lightheadedness and shortness of breath? Pt reports she did as of 01/12/2015  3. Have you checked your BP and heart rate? (document readings) No   4. Are you experiencing any other symptoms? No chest pains, Just chronic fatigue

## 2015-01-13 NOTE — Telephone Encounter (Signed)
Patient has had several episodes over the last several weeks about once a week of irregular/skipping heartbeat  Discussed with  Dr. Mare Ferrari and will have patient come for event monitor Advised patient and she will call back and schedule appointment for monitor

## 2015-02-01 ENCOUNTER — Encounter: Payer: Self-pay | Admitting: Cardiology

## 2015-02-01 ENCOUNTER — Ambulatory Visit (INDEPENDENT_AMBULATORY_CARE_PROVIDER_SITE_OTHER): Payer: PRIVATE HEALTH INSURANCE | Admitting: Cardiology

## 2015-02-01 VITALS — BP 122/78 | HR 75 | Ht 63.0 in | Wt 176.8 lb

## 2015-02-01 DIAGNOSIS — I10 Essential (primary) hypertension: Secondary | ICD-10-CM

## 2015-02-01 DIAGNOSIS — R002 Palpitations: Secondary | ICD-10-CM

## 2015-02-01 DIAGNOSIS — E669 Obesity, unspecified: Secondary | ICD-10-CM | POA: Diagnosis not present

## 2015-02-01 DIAGNOSIS — M797 Fibromyalgia: Secondary | ICD-10-CM | POA: Diagnosis not present

## 2015-02-01 NOTE — Patient Instructions (Signed)
Medication Instructions:  Your physician recommends that you continue on your current medications as directed. Please refer to the Current Medication list given to you today.  Labwork: none  Testing/Procedures: none  Follow-Up: Your physician wants you to follow-up in: 6 month ov You will receive a reminder letter in the mail two months in advance. If you don't receive a letter, please call our office to schedule the follow-up appointment.   Any Other Special Instructions Will Be Listed Below (If Applicable).

## 2015-02-01 NOTE — Progress Notes (Signed)
Cardiology Office Note   Date:  02/01/2015   ID:  Jenna Hernandez, DOB 1950-12-16, MRN [id number]  PCP:  Manon Hilding, MD  Cardiologist:   Darlin Coco, MD   No chief complaint on file.     History of Present Illness: Jenna Hernandez is a 64 y.o. female who presents for a six-month follow-up office visit  This pleasant 64 year old woman is seen for a scheduled followup office visit. She has a history of recurrent chest pains. She was  admitted on 11/10/13 for observation for chest pain. The following day she had a Myoview stress test which showed no ischemia and her ejection fraction was 81%. Several months ago she was admitted overnight for observation at [facility] for chest pain and she ruled out. Her EKG and enzymes and chest x-ray were normal and she was discharged the next day. She has a history of a non-STEMI on 12/13/11. She underwent cardiac catheterization which showed smooth and normal coronary arteries and it was felt that she had a myocardial infarction secondary to possible transient spasm with thrombosis since she did have some hypokinesis in the mid inferior wall.  the overall ejection fraction was 65%. She was readmitted to the hospital on 12/26/11 with chest pain and was discharged the following day after ruling out. During her readmission her initial cardiac catheter films were reviewed by the admitting physician Dr. Fletcher Anon who felt that she might have had a small branch occlusion of off the right coronary artery to explain her wall motion abnormalities.  Patient has been doing well with no recurrent chest pain. She has had some bilateral jaw pain and recently saw her dentist who diagnosed a problem with some bone loss. The patient admits to being under a lot of stress. She has stress at home and at work. Her work consists of sitting at a [employer] all day and she is not getting any regular exercise.  The patient was hospitalized briefly 07/21/13 until 07/23/13.  She ruled out for myocardial infarction. She presented with left neck pain. Her amlodipine was increased from 5 mg to 10 mg to treat possible vascular spasm. Since last visit she has had no further hospitalizations for chest pain.  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  She had a recent Depo Medrol shot which helped both her TMJ and her arthritis in general.  He is not exercising at the present time because of all the pollen in the air.  She is discouraged about her weight creeping up further.  Past Medical History  Diagnosis Date  . GERD (gastroesophageal reflux disease)   . Fibromyalgia   . NSTEMI (non-ST elevated myocardial infarction)     a. NSTEMI 12/2011 with cath showing smooth/normal cors, ? MI secondary to possible transient spasm with thrombosis since she did have some hypokinesis in the mid inferior wall the overall ejection fraction was 65%, (? versus small branch occlusion off the RCA). b. Nuc 11/2013 - normal.  . Anxiety   . Jaw pain     Has been told she has bone loss by dentist.  . Osteoarthritis   . Hypertension   . H/O hiatal hernia   . Chronic back pain     "from the neck down" (07/21/2013)    Past Surgical History  Procedure Laterality Date  . Cholecystectomy  ~ 2004  . Inguinal hernia repair Right ~ 1983    "femoral" (07/21/2013)  . Vaginal hysterectomy  1990  .  Tubal ligation  1979  . Cystectomy  ?1990's    "from my back" (07/21/2013)  . Cardiac catheterization  12/2011  . Left heart catheterization with coronary angiogram N/A 12/13/2011    Procedure: LEFT HEART CATHETERIZATION WITH CORONARY ANGIOGRAM;  Surgeon: Thayer Headings, MD;  Location: [facility] CATH LAB;  Service: Cardiovascular;  Laterality: N/A;     Current Outpatient Prescriptions  Medication Sig Dispense Refill  . acetaminophen (TYLENOL) 500 MG tablet Take 1,000 mg by mouth every 6 (six) hours as needed (pain).    Marland Kitchen amLODipine (NORVASC) 5 MG tablet TAKE ONE TABLET BY MOUTH ONCE DAILY  30 tablet 6  . aspirin EC 81 MG tablet Take 81 mg by mouth at bedtime.    . diazepam (VALIUM) 2 MG tablet Take 2 mg by mouth daily.    . isosorbide mononitrate (IMDUR) 30 MG 24 hr tablet TAKE ONE TABLET BY MOUTH ONCE DAILY 30 tablet 11  . losartan (COZAAR) 25 MG tablet Take 1 tablet (25 mg total) by mouth daily. 30 tablet 11  . metoprolol tartrate (LOPRESSOR) 25 MG tablet Take 0.5 tablets (12.5 mg total) by mouth 2 (two) times daily. 30 tablet 11  . nitroGLYCERIN (NITROSTAT) 0.4 MG SL tablet Place 1 tablet (0.4 mg total) under the tongue every 5 (five) minutes as needed for chest pain (up to 3 doses). 25 tablet 3  . pantoprazole (PROTONIX) 40 MG tablet Take 40 mg by mouth 2 (two) times daily.     . pravastatin (PRAVACHOL) 20 MG tablet TAKE ONE TABLET BY MOUTH ONCE DAILY 30 tablet 11  . ranitidine (ZANTAC) 150 MG tablet Take 1 tablet (150 mg total) by mouth 2 (two) times daily as needed for heartburn.     No current facility-administered medications for this visit.    Allergies:   Diclofenac sodium; Meperidine-promethazine; Ramipril; Raspberry; Codeine; Prednisone; and Sulfa antibiotics    Social History:  The patient  reports that she quit smoking about 3 years ago. Her smoking use included Cigarettes. She has a 11.5 pack-year smoking history. She has never used smokeless tobacco. She reports that she does not drink alcohol or use illicit drugs.   Family History:  The patient's family history includes Bladder Cancer in her mother; Colon cancer in her mother; Coronary artery disease (age of onset: 86) in her father; Diabetes in her father and sister; Fibromyalgia in her mother; Heart attack (age of onset: 16) in her father; Irregular heart beat in her mother; Osteoarthritis in her mother; Osteoporosis in her mother.    ROS:  Please see the history of present illness.   Otherwise, review of systems are positive for none.   All other systems are reviewed and negative.    PHYSICAL EXAM: VS:  BP  122/78 mmHg  Pulse 75  Ht 5\' 3"  (1.6 m)  Wt 176 lb 12.8 oz (80.196 kg)  BMI 31.33 kg/m2 , BMI Body mass index is 31.33 kg/(m^2). GEN: Well nourished, well developed, in no acute distress HEENT: normal Neck: no JVD, carotid bruits, or masses Cardiac: RRR; no murmurs, rubs, or gallops,no edema  Respiratory:  clear to auscultation bilaterally, normal work of breathing GI: soft, nontender, nondistended, + BS MS: no deformity or atrophy Skin: warm and dry, no rash Neuro:  Strength and sensation are intact Psych: euthymic mood, full affect   EKG:  EKG is not ordered today.   Recent Labs: No results found for requested labs within last 365 days.    Lipid Panel  Component Value Date/Time   CHOL 159 12/13/2011 0630   TRIG 116 12/13/2011 0630   HDL 62 12/13/2011 0630   CHOLHDL 2.6 12/13/2011 0630   VLDL 23 12/13/2011 0630   LDLCALC 74 12/13/2011 0630      Wt Readings from Last 3 Encounters:  02/01/15 176 lb 12.8 oz (80.196 kg)  07/30/14 174 lb (78.926 kg)  01/27/14 172 lb (78.019 kg)      *   ASSESSMENT AND PLAN:  1. history of non-STEMI with angiographically normal coronary arteries in 12/13/11 2. essential hypertension 3. Fibromyalgia 4. GERD 5. Hypercholesterolemia  Disposition: Continue current medication. Recheck in 6 months for followup office visit. Try to get more regular exercise to help with weight loss.  Current medicines are reviewed at length with the patient today.  The patient does not have concerns regarding medicines.  The following changes have been made:  no change  Labs/ tests ordered today include: No orders of the defined types were placed in this encounter.    Signed, Darlin Coco, MD  02/01/2015 5:11 PM    [address] [place], [place], [place]  [address] Phone: [phone]; Fax: [phone]

## 2015-02-28 ENCOUNTER — Encounter (INDEPENDENT_AMBULATORY_CARE_PROVIDER_SITE_OTHER): Payer: Self-pay | Admitting: Internal Medicine

## 2015-02-28 ENCOUNTER — Encounter (INDEPENDENT_AMBULATORY_CARE_PROVIDER_SITE_OTHER): Payer: Self-pay | Admitting: *Deleted

## 2015-02-28 ENCOUNTER — Ambulatory Visit (INDEPENDENT_AMBULATORY_CARE_PROVIDER_SITE_OTHER): Payer: PRIVATE HEALTH INSURANCE | Admitting: Internal Medicine

## 2015-02-28 VITALS — BP 110/70 | HR 78 | Temp 97.6°F | Resp 18 | Ht 63.0 in | Wt 175.6 lb

## 2015-02-28 DIAGNOSIS — R1013 Epigastric pain: Secondary | ICD-10-CM | POA: Diagnosis not present

## 2015-02-28 DIAGNOSIS — R0789 Other chest pain: Secondary | ICD-10-CM

## 2015-02-28 MED ORDER — DEXLANSOPRAZOLE 60 MG PO CPDR: 60.0000 mg | DELAYED_RELEASE_CAPSULE | Freq: Every day | ORAL | Status: DC

## 2015-02-28 NOTE — Progress Notes (Signed)
Presenting complaint;  Epigastric and chest pain of 6 months duration.  History of present illness;  Patient is 64 year old Caucasian female patient of Dr. Mora Appl who is self-referred for GI evaluation. Patient has been evaluated by Dr. Doristine Mango in 2013. She had an appointment with him recently and he declined to see her because she did not take her medications with her even though she had the complete list. She has been experiencing lower sternal and epigastric pain for 6 months. This pain is not constant and occurs intermittently at least 3 times a week. And is described to be sharp and at times excruciating and is always relieved with Mylanta. Pain does not occur soon after meals but may occur 2-3 hours later. Pain is associated with nausea but no vomiting. She also has frequent belching after each meal as well as bloating. She also has intermittent nocturnal regurgitation. He was seen in emergency room at [facility] following an episode of prolonged sharp pain on 02/24/2015. Workup was negative. EKG did not show any abnormalities to suggest ischemia and/or injury. CBC and comprehensive chemistry panel were normal and H. pylori serology was negative. Chest pain was similarly unremarkable and troponin level was less than 0.01. Patient's symptoms are felt to be due to GERD and she was advised follow-up with PCP. She had EGD by Dr. Britta Mccreedy in December 2013 and was noted to have small sliding hiatal hernia otherwise normal EGD. At that time she was also complaining of dysphagia. Patient states she is been on pantoprazole once daily for about 3 years. Dose was doubled by Dr. Quintin Alto 6 months ago but without relief of her pain.  She also has been taking ranitidine 150 mg twice daily but still having this pain. She denies dysphagia or hoarseness chronic cough or sore throat. She has good appetite. She states she has gained 10-15 pounds in the last 6 months. She weighed 144 pounds 3 years ago. Now she  is 175 pounds. She is watching her diet at least 50% of the time. In the past she also has taken Prilosec and Prevacid as well as Nexium but was not covered by her plan. She denies shortness of breath melena or rectal bleeding. Her bowels move daily as long as she takes Benefiber. He does not take OTC NSAIDs.   Current Medications: Outpatient Encounter Prescriptions as of 02/28/2015  Medication Sig  . acetaminophen (TYLENOL) 500 MG tablet Take 1,000 mg by mouth every 6 (six) hours as needed (pain).  Marland Kitchen amLODipine (NORVASC) 5 MG tablet TAKE ONE TABLET BY MOUTH ONCE DAILY  . aspirin EC 81 MG tablet Take 81 mg by mouth at bedtime.  . Cholecalciferol (VITAMIN D) 2000 UNITS tablet Take 2,000 Units by mouth daily.  . diazepam (VALIUM) 2 MG tablet Take 2 mg by mouth daily.  . isosorbide mononitrate (IMDUR) 30 MG 24 hr tablet TAKE ONE TABLET BY MOUTH ONCE DAILY  . losartan (COZAAR) 25 MG tablet Take 1 tablet (25 mg total) by mouth daily.  . metoprolol tartrate (LOPRESSOR) 25 MG tablet Take 0.5 tablets (12.5 mg total) by mouth 2 (two) times daily.  . nitroGLYCERIN (NITROSTAT) 0.4 MG SL tablet Place 1 tablet (0.4 mg total) under the tongue every 5 (five) minutes as needed for chest pain (up to 3 doses).  Marland Kitchen OVER THE COUNTER MEDICATION IBGUARD - prn  . pantoprazole (PROTONIX) 40 MG tablet Take 40 mg by mouth 2 (two) times daily.   . pravastatin (PRAVACHOL) 20 MG tablet TAKE ONE  TABLET BY MOUTH ONCE DAILY  . ranitidine (ZANTAC) 150 MG tablet Take 1 tablet (150 mg total) by mouth 2 (two) times daily as needed for heartburn.  . Wheat Dextrin (BENEFIBER PO) Take by mouth 2 (two) times daily.   No facility-administered encounter medications on file as of 02/28/2015.   Past medical history;  Bilateral tubal ligation 1979. Right femoral herniorrhaphy in early 80s. Hysterectomy for excessive bleeding in 1990. Cholecystectomy in 2001. Chronic GERD. She had EGD in January 2007 by Dr. Gala Romney revealing mild  changes of reflux esophagitis. EGD by Dr. Britta Mccreedy in December 2013 revealed small sliding hiatal hernia but no evidence of erosive esophagitis. Fibromyalgia. Osteoarthrosis. Hypertension. Hyperlipidemia. She had MI in March 2013. Cardiac catheter revealed small vessels. She had colonoscopy within the last few years and was negative for cough adenomas. It revealed diverticulosis.   Allergies; Allergies  Allergen Reactions  . Diclofenac Sodium Anaphylaxis    Tolerates aspirin - was told not to take antiinflammatory meds.  . Meperidine-Promethazine     headache  . Ramipril     cough  . Raspberry Hives  . Codeine Rash  . Prednisone Palpitations  . Sulfa Antibiotics Rash    Family history;  Father is 59 years old and has diabetes mellitus. Mother is 30 and has been treated for bladder cancer as well as colorectal carcinoma and she also has achalasia. Maternal uncle was diagnosed with CRC in his 42s and maternal grandmother was diagnosed with CRC in her 62s and died at 33. She has 2 brothers ages 84 and 70 in good health. She has one sister age 23 who is diabetic.   Social history;  She is divorced. She has 3 children ages 80, 20 and 80 in good health. She is presently working at [employer] where she has been for 9 years and prior to that she worked at [employer]. She smokes cigarettes for 15 years no more than 2-3 cigarettes per day but quit in 2013 when she had an MI. She does not drink alcohol.   Physical examination: Blood pressure 110/70, pulse 78, temperature 97.6 F (36.4 C), temperature source Oral, resp. rate 18, height 5\' 3"  (1.6 m), weight 175 lb 9.6 oz (79.652 kg). Patient is alert and in no acute distress. Conjunctiva is pink. Sclera is nonicteric Oropharyngeal mucosa is normal. No neck masses or thyromegaly noted. No chest wall tenderness noted. Cardiac exam with regular rhythm normal S1 and S2. No murmur or gallop noted. Lungs are clear to  auscultation. Abdomen is symmetrical. Bowel sounds are normal. No bruits noted. On palpation abdomen is soft with mild midepigastric tenderness. No organomegaly or masses. Rectal examination reveals guaiac-negative stool.  No LE edema or clubbing noted.  Labs/studies Results: Lab data from 02/24/2015   VBC 7.0, H&H 12.9 and 39.9 and platelet count 255K differential within normal limits. Serum sodium 141, potassium 4.4, chloride 100, CO2 28.6, BUN 22, creatinine 0.83, glucose 127 and serum calcium 10.0. Serum magnesium 2.1. Total bilirubin 0.3, AP 67, AST 17.6, ALT 19, protein 7.3 and albumin 4.4. H. pylori serology negative.   Assessment:  Patient is 64 year old Caucasian female who was chronic GERD and has been maintained on PPI who presents with 6 months history of lower sternal and midepigastric pain which seems to be relieved with Mylanta. However she has not responded to double does pantoprazole and ranitidine. She has history of coronary artery disease and workup in emergency room last week was negative for cardiac etiology. It is possible  that her symptoms are due to gastroesophageal reflux disease and that pantoprazole is not working anymore. She could have biliary tract disease or peptic ulcer disease but index of suspicion is low. Reassuring to know that recent CBC and comprehensive chemistry panel were normal and H. pylori serology was negative. She may need EGD if she does not respond to another PPI.   Recommendations;  Discontinue pantoprazole and take ranitidine at bedtime. Dexlansoprazole 60 mg by mouth every morning. 15 doses given. Abdominopelvic CT with contrast. Will request colonoscopy records from [facility]. Further recommendations to follow.

## 2015-02-28 NOTE — Patient Instructions (Signed)
Stop pantoprazole. Begin Dexilant 60 mg by mouth 30 minutes before breakfast daily. Physician will call with results of CT and further recommendations

## 2015-03-10 ENCOUNTER — Encounter (INDEPENDENT_AMBULATORY_CARE_PROVIDER_SITE_OTHER): Payer: Self-pay

## 2015-03-11 ENCOUNTER — Encounter (INDEPENDENT_AMBULATORY_CARE_PROVIDER_SITE_OTHER): Payer: Self-pay

## 2015-03-23 ENCOUNTER — Ambulatory Visit (INDEPENDENT_AMBULATORY_CARE_PROVIDER_SITE_OTHER): Payer: Self-pay | Admitting: Internal Medicine

## 2015-03-28 ENCOUNTER — Ambulatory Visit (INDEPENDENT_AMBULATORY_CARE_PROVIDER_SITE_OTHER): Payer: Self-pay | Admitting: Internal Medicine

## 2015-03-30 ENCOUNTER — Other Ambulatory Visit: Payer: Self-pay | Admitting: Cardiology

## 2015-04-29 ENCOUNTER — Other Ambulatory Visit: Payer: Self-pay | Admitting: Cardiology

## 2015-04-29 NOTE — Telephone Encounter (Signed)
Rx(s) sent to pharmacy electronically.  

## 2015-05-10 ENCOUNTER — Ambulatory Visit (INDEPENDENT_AMBULATORY_CARE_PROVIDER_SITE_OTHER): Payer: Self-pay | Admitting: Internal Medicine

## 2015-05-10 DIAGNOSIS — M7061 Trochanteric bursitis, right hip: Secondary | ICD-10-CM | POA: Diagnosis not present

## 2015-05-10 DIAGNOSIS — M5431 Sciatica, right side: Secondary | ICD-10-CM | POA: Diagnosis not present

## 2015-05-16 ENCOUNTER — Ambulatory Visit (INDEPENDENT_AMBULATORY_CARE_PROVIDER_SITE_OTHER): Payer: PRIVATE HEALTH INSURANCE | Admitting: Internal Medicine

## 2015-05-16 ENCOUNTER — Encounter (INDEPENDENT_AMBULATORY_CARE_PROVIDER_SITE_OTHER): Payer: Self-pay | Admitting: Internal Medicine

## 2015-05-16 VITALS — BP 128/80 | HR 66 | Temp 98.2°F | Resp 18 | Ht 63.0 in | Wt 174.3 lb

## 2015-05-16 DIAGNOSIS — K219 Gastro-esophageal reflux disease without esophagitis: Secondary | ICD-10-CM

## 2015-05-16 DIAGNOSIS — R1013 Epigastric pain: Secondary | ICD-10-CM

## 2015-05-16 MED ORDER — RANITIDINE HCL 150 MG PO TABS: 150.0000 mg | ORAL_TABLET | Freq: Every day | ORAL | Status: DC

## 2015-05-16 MED ORDER — SUCRALFATE 1 G PO TABS: 1.0000 g | ORAL_TABLET | Freq: Three times a day (TID) | ORAL | Status: DC

## 2015-05-16 MED ORDER — DICYCLOMINE HCL 10 MG PO CAPS: ORAL_CAPSULE | ORAL | Status: DC

## 2015-05-16 NOTE — Progress Notes (Signed)
Presenting complaint;  Follow-up for epigastric pain.  Subjective:  Patient is 64 year old Caucasian female who is here for reevaluation of epigastric pain which she's had for close to 8-9 months. She has chronic GERD but she describes his pain to be different than heartburn. However pain is relieved with Mylanta. She was switched to Eunice which she took for 3 weeks and did not see much improvement. Her co-pay was over $100 and she therefore has gone back to pantoprazole. She had abdominopelvic CT which revealed 2 small cystic lesion in the liver but no retroperitoneal adenopathy or pancreatic lesion. She states pain is not associated with meals and sometimes wakes her up in the middle of night. 2 weeks ago she took nitroglycerin again which seemed to help just like Mylanta has. She has good appetite but she is trying to lose weight. She is on Valium to help her sleep at night. She denies nausea vomiting melena or rectal bleeding. Her bowels move daily or every other day. Her weight is down by 1 pound since her last visit of 02/28/2015.    Current Medications: Outpatient Encounter Prescriptions as of 05/16/2015  Medication Sig  . acetaminophen (TYLENOL) 500 MG tablet Take 1,000 mg by mouth every 6 (six) hours as needed (pain).  Marland Kitchen amLODipine (NORVASC) 5 MG tablet TAKE ONE TABLET BY MOUTH ONCE DAILY  . aspirin EC 81 MG tablet Take 81 mg by mouth at bedtime.  . Cholecalciferol (VITAMIN D) 2000 UNITS tablet Take 2,000 Units by mouth daily.  . diazepam (VALIUM) 2 MG tablet Take 2 mg by mouth daily.  . isosorbide mononitrate (IMDUR) 30 MG 24 hr tablet TAKE ONE TABLET BY MOUTH ONCE DAILY  . losartan (COZAAR) 25 MG tablet Take 1 tablet (25 mg total) by mouth daily.  . metoprolol tartrate (LOPRESSOR) 25 MG tablet Take 0.5 tablets (12.5 mg total) by mouth 2 (two) times daily.  . nitroGLYCERIN (NITROSTAT) 0.4 MG SL tablet Place 1 tablet (0.4 mg total) under the tongue every 5 (five) minutes as needed for  chest pain (up to 3 doses).  Marland Kitchen OVER THE COUNTER MEDICATION IBGUARD - prn  . pravastatin (PRAVACHOL) 20 MG tablet TAKE ONE TABLET BY MOUTH ONCE DAILY  . ranitidine (ZANTAC) 150 MG tablet Take 1 tablet (150 mg total) by mouth 2 (two) times daily as needed for heartburn.  . Wheat Dextrin (BENEFIBER PO) Take by mouth 2 (two) times daily.  Marland Kitchen dexlansoprazole (DEXILANT) 60 MG capsule Take 1 capsule (60 mg total) by mouth daily before breakfast. (Patient not taking: Reported on 05/16/2015)   No facility-administered encounter medications on file as of 05/16/2015.     Objective: Blood pressure 128/80, pulse 66, temperature 98.2 F (36.8 C), temperature source Oral, resp. rate 18, height 5\' 3"  (1.6 m), weight 174 lb 4.8 oz (79.062 kg). Patient is alert and in no acute distress. Conjunctiva is pink. Sclera is nonicteric Oropharyngeal mucosa is normal. No neck masses or thyromegaly noted. Cardiac exam with regular rhythm normal S1 and S2. No murmur or gallop noted. Lungs are clear to auscultation. Abdomen is full. Bowel sounds are normal. No bruits noted. On palpation abdomen is soft with mild midepigastric tenderness which is in the midline. No organomegaly or masses noted. No LE edema or clubbing noted.  Labs/studies Results:   CT images reviewed with patient.  Assessment:  #1. Epigastric pain of over 6 months duration unresponsive to PPI therapy. I am not convinced that pain is of GI origin unless he has bile  reflux or IBS. She could also have wall pain although examination does not reveal superficial tenderness. If she does not respond to any treatment will consider diagnostic EGD. #2. GERD. Symptoms appear to be well controlled with therapy.   Plan:  Continue pantoprazole at 40 mg by mouth twice a day. Take ranitidine 150 mg by mouth daily at bedtime. Sucralfate 1 g by mouth before meals and daily at bedtime. Dicyclomine 10 mg 3 times a day when necessary. Patient will call office visit  with progress report in one month.

## 2015-05-16 NOTE — Patient Instructions (Addendum)
Take second dose of pantoprazole before evening meal and ranitidine or Zantac 150 mg at bedtime. Please keep symptom diary and call office with progress report in one month.

## 2015-05-25 ENCOUNTER — Other Ambulatory Visit (INDEPENDENT_AMBULATORY_CARE_PROVIDER_SITE_OTHER): Payer: Self-pay | Admitting: *Deleted

## 2015-05-25 ENCOUNTER — Telehealth (INDEPENDENT_AMBULATORY_CARE_PROVIDER_SITE_OTHER): Payer: Self-pay | Admitting: *Deleted

## 2015-05-25 ENCOUNTER — Encounter (INDEPENDENT_AMBULATORY_CARE_PROVIDER_SITE_OTHER): Payer: Self-pay | Admitting: *Deleted

## 2015-05-25 DIAGNOSIS — G8929 Other chronic pain: Secondary | ICD-10-CM

## 2015-05-25 DIAGNOSIS — R1013 Epigastric pain: Principal | ICD-10-CM

## 2015-05-25 NOTE — Telephone Encounter (Signed)
Tangela called and states that she doesn't want Dr.Rehman to be made at her,but she has not taken the Carafate or Dicyclomine. The reason is she is very concerned about the side effects of both medications. She states with her health issues she is concerned about taking anything that may hinder how the other medications work.  Sh is requesting that Dr.Rehman go ahead and precede with the EGD to see what the problem may be.  Patient was advised that this would be reviewed with Dr.Rehman and we would call her back with his recommendation.

## 2015-05-25 NOTE — Telephone Encounter (Signed)
EGD sch'd 06/09/15 at 300 (200), patient aware, instructions mailed

## 2015-05-25 NOTE — Telephone Encounter (Signed)
Please schedule patient for EGD.

## 2015-06-08 ENCOUNTER — Inpatient Hospital Stay (HOSPITAL_COMMUNITY)
Admission: AD | Admit: 2015-06-08 | Discharge: 2015-06-09 | DRG: 287 | Disposition: A | Discharge status: Home / Self Care | Payer: PRIVATE HEALTH INSURANCE | Source: Other Acute Inpatient Hospital | Attending: Cardiology | Admitting: Cardiology

## 2015-06-08 ENCOUNTER — Encounter (HOSPITAL_COMMUNITY): Payer: Self-pay | Admitting: General Practice

## 2015-06-08 ENCOUNTER — Telehealth (INDEPENDENT_AMBULATORY_CARE_PROVIDER_SITE_OTHER): Payer: Self-pay | Admitting: *Deleted

## 2015-06-08 DIAGNOSIS — R079 Chest pain, unspecified: Secondary | ICD-10-CM | POA: Diagnosis not present

## 2015-06-08 DIAGNOSIS — Z7982 Long term (current) use of aspirin: Secondary | ICD-10-CM

## 2015-06-08 DIAGNOSIS — G8929 Other chronic pain: Secondary | ICD-10-CM | POA: Diagnosis present

## 2015-06-08 DIAGNOSIS — M549 Dorsalgia, unspecified: Secondary | ICD-10-CM | POA: Diagnosis present

## 2015-06-08 DIAGNOSIS — I10 Essential (primary) hypertension: Secondary | ICD-10-CM | POA: Diagnosis present

## 2015-06-08 DIAGNOSIS — M1990 Unspecified osteoarthritis, unspecified site: Secondary | ICD-10-CM | POA: Diagnosis present

## 2015-06-08 DIAGNOSIS — I252 Old myocardial infarction: Secondary | ICD-10-CM

## 2015-06-08 DIAGNOSIS — Z79899 Other long term (current) drug therapy: Secondary | ICD-10-CM | POA: Diagnosis not present

## 2015-06-08 DIAGNOSIS — I25110 Atherosclerotic heart disease of native coronary artery with unstable angina pectoris: Secondary | ICD-10-CM | POA: Diagnosis present

## 2015-06-08 DIAGNOSIS — R072 Precordial pain: Secondary | ICD-10-CM | POA: Diagnosis not present

## 2015-06-08 DIAGNOSIS — I200 Unstable angina: Secondary | ICD-10-CM | POA: Insufficient documentation

## 2015-06-08 DIAGNOSIS — E785 Hyperlipidemia, unspecified: Secondary | ICD-10-CM | POA: Diagnosis present

## 2015-06-08 DIAGNOSIS — Z87891 Personal history of nicotine dependence: Secondary | ICD-10-CM | POA: Diagnosis not present

## 2015-06-08 DIAGNOSIS — K219 Gastro-esophageal reflux disease without esophagitis: Secondary | ICD-10-CM | POA: Diagnosis present

## 2015-06-08 DIAGNOSIS — M797 Fibromyalgia: Secondary | ICD-10-CM | POA: Diagnosis present

## 2015-06-08 DIAGNOSIS — R0789 Other chest pain: Secondary | ICD-10-CM | POA: Diagnosis present

## 2015-06-08 HISTORY — DX: Pure hypercholesterolemia, unspecified: E78.00

## 2015-06-08 LAB — MRSA PCR SCREENING: MRSA BY PCR: NEGATIVE

## 2015-06-08 LAB — TROPONIN I

## 2015-06-08 LAB — HEPARIN LEVEL (UNFRACTIONATED): Heparin Unfractionated: 1.1 IU/mL — ABNORMAL HIGH (ref 0.30–0.70)

## 2015-06-08 MED ORDER — SODIUM CHLORIDE 0.9 % IV SOLN: 250.0000 mL | INTRAVENOUS | Status: DC | PRN

## 2015-06-08 MED ORDER — DIAZEPAM 2 MG PO TABS
2.0000 mg | ORAL_TABLET | Freq: Every day | ORAL | Status: DC
  Administered 2015-06-09: 2 mg via ORAL
  Filled 2015-06-08: qty 1

## 2015-06-08 MED ORDER — NITROGLYCERIN 0.4 MG SL SUBL
0.4000 mg | SUBLINGUAL_TABLET | SUBLINGUAL | Status: DC | PRN
  Administered 2015-06-09: 0.4 mg via SUBLINGUAL
  Filled 2015-06-08: qty 1

## 2015-06-08 MED ORDER — HEPARIN (PORCINE) IN NACL 100-0.45 UNIT/ML-% IJ SOLN
1000.0000 [IU]/h | INTRAMUSCULAR | Status: DC
  Administered 2015-06-08: 1000 [IU]/h via INTRAVENOUS

## 2015-06-08 MED ORDER — SODIUM CHLORIDE 0.9 % IJ SOLN: 3.0000 mL | INTRAMUSCULAR | Status: DC | PRN

## 2015-06-08 MED ORDER — ISOSORBIDE MONONITRATE ER 30 MG PO TB24
30.0000 mg | ORAL_TABLET | Freq: Every day | ORAL | Status: DC
  Administered 2015-06-09: 30 mg via ORAL
  Filled 2015-06-08: qty 1

## 2015-06-08 MED ORDER — PANTOPRAZOLE SODIUM 40 MG PO TBEC
40.0000 mg | DELAYED_RELEASE_TABLET | Freq: Two times a day (BID) | ORAL | Status: DC
  Administered 2015-06-08 – 2015-06-09 (×2): 40 mg via ORAL
  Filled 2015-06-08 (×2): qty 1

## 2015-06-08 MED ORDER — SUCRALFATE 1 G PO TABS
1.0000 g | ORAL_TABLET | Freq: Three times a day (TID) | ORAL | Status: DC
  Filled 2015-06-08: qty 1

## 2015-06-08 MED ORDER — SODIUM CHLORIDE 0.9 % IJ SOLN
3.0000 mL | Freq: Two times a day (BID) | INTRAMUSCULAR | Status: DC
  Administered 2015-06-08: 3 mL via INTRAVENOUS

## 2015-06-08 MED ORDER — ASPIRIN EC 81 MG PO TBEC: 81.0000 mg | DELAYED_RELEASE_TABLET | Freq: Every day | ORAL | Status: DC

## 2015-06-08 MED ORDER — PRAVASTATIN SODIUM 20 MG PO TABS: 20.0000 mg | ORAL_TABLET | Freq: Every day | ORAL | Status: DC

## 2015-06-08 MED ORDER — AMLODIPINE BESYLATE 5 MG PO TABS
5.0000 mg | ORAL_TABLET | Freq: Every day | ORAL | Status: DC
  Administered 2015-06-09: 5 mg via ORAL
  Filled 2015-06-08: qty 1

## 2015-06-08 MED ORDER — ACETAMINOPHEN 500 MG PO TABS: 1000.0000 mg | ORAL_TABLET | Freq: Four times a day (QID) | ORAL | Status: DC | PRN

## 2015-06-08 MED ORDER — ONDANSETRON HCL 4 MG/2ML IJ SOLN
4.0000 mg | Freq: Four times a day (QID) | INTRAMUSCULAR | Status: DC | PRN
Start: 2015-06-08 — End: 2015-06-09

## 2015-06-08 MED ORDER — FAMOTIDINE 20 MG PO TABS
20.0000 mg | ORAL_TABLET | Freq: Every day | ORAL | Status: DC
  Administered 2015-06-08: 20 mg via ORAL
  Filled 2015-06-08: qty 1

## 2015-06-08 MED ORDER — LOSARTAN POTASSIUM 25 MG PO TABS
25.0000 mg | ORAL_TABLET | Freq: Every day | ORAL | Status: DC
  Administered 2015-06-09: 25 mg via ORAL
  Filled 2015-06-08: qty 1

## 2015-06-08 NOTE — Progress Notes (Signed)
Pt refused to have bed alarm on. Pt educated. Will cont to monitor pt.

## 2015-06-08 NOTE — Telephone Encounter (Signed)
Patient's mother Jenna Hernandez called to cancel Jenna Hernandez's EGD tomorrow, Jenna Hernandez is having chest pain, being treated in [place] ED and being transported to [facility].

## 2015-06-08 NOTE — H&P (Signed)
Patient ID: Jenna Hernandez MRN: [id number], DOB/AGE: 1951-06-13   Admit date: 06/08/2015   Primary Physician: Manon Hilding, MD Primary Cardiologist: Vaughan Browner, MD   Pt. Profile:  64 y/o female with a h/o chest pain and possible RCA branch vessel dzs, who presents on Tx from [facility] 2/2 recurrent chest pain.  Problem List  Past Medical History  Diagnosis Date  . GERD (gastroesophageal reflux disease)   . Fibromyalgia   . NSTEMI (non-ST elevated myocardial infarction)     a. NSTEMI 12/2011 with cath showing smooth/normal cors, ? MI secondary to possible transient spasm with thrombosis since she did have some hypokinesis in the mid inferior wall the overall ejection fraction was 65%, (? versus small branch occlusion off the RCA). b. Nuc 11/2013 - normal.  . Anxiety   . Jaw pain     Has been told she has bone loss by dentist.  . Osteoarthritis   . Hypertension   . H/O hiatal hernia   . Chronic back pain     "from the neck down" (07/21/2013)  . Diverticulosis     Past Surgical History  Procedure Laterality Date  . Cholecystectomy  ~ 2004  . Inguinal hernia repair Right ~ 1983    "femoral" (07/21/2013)  . Vaginal hysterectomy  1990  . Tubal ligation  1979  . Cystectomy  ?1990's    "from my back" (07/21/2013)  . Cardiac catheterization  12/2011  . Left heart catheterization with coronary angiogram N/A 12/13/2011    Procedure: LEFT HEART CATHETERIZATION WITH CORONARY ANGIOGRAM;  Surgeon: Thayer Headings, MD;  Location: [facility] C[facility] LAB;  Service: Cardiovascular;  Laterality: N/A;  . Colonoscopy  08/2013    [facility] /Dr.Benson  . Upper gastrointestinal endoscopy  09/2012    [facility]/Dr.Benson     Allergies  Allergies  Allergen Reactions  . Diclofenac Sodium Anaphylaxis    Tolerates aspirin - was told not to take antiinflammatory meds.  . Meperidine-Promethazine     headache  . Ramipril     cough  . Raspberry Hives  . Codeine Rash  . Prednisone Palpitations  . Sulfa  Antibiotics Rash    HPI  64 y/o female with a h/o chest pain and prior NSTEMI in 12/2013. Cath was initially felt to show clean coronaries however upon later review at the time of a readmission a few wks later, it was felt that perhaps there was a small branch occlusion off of the RCA.  She was last admitted for c/p in 11/2013 and had a negative myoview.  She has chronic, mild DOE, which hasn't changed recently.  She has also had increasing frequency of epigastric pain and has been followed closely by Dr. Laural Golden in [place].  She was scheduled for EGD to take place on 9/1.  Today, she was at work and developed sscp w/o associated Ss.  She took a zantac w/o relief.  At 12:30 she took a SL ntg w/o relief.  At one point, she experienced flushing w/o associated palpitations or presyncope.  Due to persistent Ss, she presented to the [facility] ED and was treated with ASA and NTG.  ECG and trop were normal.  Due to persistent c/p, she was transferred to [facility] for further evaluation.  Currently, c/p is somewhat better, though she continues to have intermittent, fleeting, jaw pain.  She does have chest wall tenderness, which is different from presenting Ss.  Home Medications  Prior to Admission medications   Medication Sig Start Date End  Date Taking? Authorizing Provider  acetaminophen (TYLENOL) 500 MG tablet Take 1,000 mg by mouth every 6 (six) hours as needed (pain).    Historical Provider, MD  amLODipine (NORVASC) 5 MG tablet TAKE ONE TABLET BY MOUTH ONCE DAILY 04/29/15   Darlin Coco, MD  aspirin EC 81 MG tablet Take 81 mg by mouth at bedtime.    Historical Provider, MD  Cholecalciferol (VITAMIN D) 2000 UNITS tablet Take 2,000 Units by mouth daily.    Historical Provider, MD  diazepam (VALIUM) 2 MG tablet Take 2 mg by mouth daily.    Historical Provider, MD  dicyclomine (BENTYL) 10 MG capsule Take 10 mg by mouth up to 3 times a day as needed for pain 05/16/15   Rogene Houston, MD  isosorbide  mononitrate (IMDUR) 30 MG 24 hr tablet TAKE ONE TABLET BY MOUTH ONCE DAILY 07/30/14   Darlin Coco, MD  losartan (COZAAR) 25 MG tablet Take 1 tablet (25 mg total) by mouth daily. 07/30/14   Darlin Coco, MD  metoprolol tartrate (LOPRESSOR) 25 MG tablet Take 0.5 tablets (12.5 mg total) by mouth 2 (two) times daily. 07/30/14   Darlin Coco, MD  nitroGLYCERIN (NITROSTAT) 0.4 MG SL tablet Place 1 tablet (0.4 mg total) under the tongue every 5 (five) minutes as needed for chest pain (up to 3 doses). 11/11/13   Dayna N Dunn, PA-C  OVER THE COUNTER MEDICATION IBGUARD - prn    Historical Provider, MD  pantoprazole (PROTONIX) 40 MG tablet Take 40 mg by mouth 2 (two) times daily before a meal.    Historical Provider, MD  pravastatin (PRAVACHOL) 20 MG tablet TAKE ONE TABLET BY MOUTH ONCE DAILY 07/30/14   Darlin Coco, MD  ranitidine (ZANTAC) 150 MG tablet Take 1 tablet (150 mg total) by mouth at bedtime. 05/16/15   Rogene Houston, MD  sucralfate (CARAFATE) 1 G tablet Take 1 tablet (1 g total) by mouth 4 (four) times daily -  with meals and at bedtime. 05/16/15   Rogene Houston, MD  Wheat Dextrin (BENEFIBER PO) Take by mouth 2 (two) times daily.    Historical Provider, MD    Family History  Family History  Problem Relation Age of Onset  . Coronary artery disease Father 64  . Heart attack Father 18  . Diabetes Father   . Irregular heart beat Mother   . Bladder Cancer Mother   . Colon cancer Mother   . Fibromyalgia Mother   . Osteoarthritis Mother   . Osteoporosis Mother   . Diabetes Sister     Social History  Social History   Social History  . Marital Status: Divorced    Spouse Name: N/A  . Number of Children: N/A  . Years of Education: N/A   Occupational History  .      [employer] - registration specialist.   Social History Main Topics  . Smoking status: Former Smoker -- 0.50 packs/day for 23 years    Types: Cigarettes    Quit date: 12/12/2011  .  Smokeless tobacco: Never Used  . Alcohol Use: No  . Drug Use: No  . Sexual Activity: Not Currently   Other Topics Concern  . Not on file   Social History Narrative   ** Merged History Encounter **         Review of Systems General:  No chills, fever, night sweats or weight changes.  Cardiovascular:  +++ chest pain, +++ dyspnea on exertion, no edema, orthopnea, palpitations, paroxysmal nocturnal  dyspnea. Dermatological: No rash, lesions/masses Respiratory: No cough, +++ dyspnea Urologic: No hematuria, dysuria Abdominal:   +++ nausea, no vomiting, diarrhea, bright red blood per rectum, melena, or hematemesis Neurologic:  No visual changes, wkns, changes in mental status. All other systems reviewed and are otherwise negative except as noted above.  Physical Exam  Blood pressure 138/83, pulse 70, temperature 98.1 F (36.7 C), temperature source Oral, height 5\' 3"  (1.6 m), weight 172 lb (78.019 kg), SpO2 96 %.  General: Pleasant, NAD Psych: Normal affect. Neuro: Alert and oriented X 3. Moves all extremities spontaneously. HEENT: Normal  Neck: Supple without bruits or JVD. Lungs:  Resp regular and unlabored, CTA. Heart: RRR no s3, s4, or murmurs.  Chest wall pain w/ palpation. Abdomen: Soft, non-tender, non-distended, BS + x 4.  Extremities: No clubbing, cyanosis or edema. DP/PT/Radials 2+ and equal bilaterally.  Labs  H/H 13.2/39.9 WBC 6.2 PLT 260 INR 0.9 Na 138, K 4.1, Cl 99, CO2 28.3, BUN 19, Creat 0.83, Gluc 114 Trop T < 0.01 pBNP 213.2 D dimer 0.24   Radiology/Studies  No results found.  ECG  RSR, 84, delayed R progression.  No acute st/t changes.  ASSESSMENT AND PLAN  1.  Midsternal chest and jaw pain:  Pt presents on tx from [facility] following visit for several hours of midsternal chest and jaw pain.  ECG non-acute.  Initial troponin is negative.  She is currently chest pain free.  Admit, cycle CE.  Cont home doses of asa, statin, bb (hold in AM).   If troponin remains nl, plan Ex Cardiolite in the AM.  Of note, she has been having more freq epigastric pain and was scheduled for outpt EGD for 9/1.  ? Role in current Ss.  Cont BID PPI.  Will still need outpt GI eval.  2.  HTN:  Stable.  Cont home meds.  3. HL:  Cont statin Rx.  4.  GERD:  Cont BID PPI.  Signed, Murray Hodgkins, NP 06/08/2015, 6:24 PM Agree with above assessment and plan. Her exam today is normal. She does have localized chest wall tenderness but this is different from her presenting chest pain.  Initial EKG at [facility] normal. Enzymes normal so far. If she rules out overnight, we will do treadmill myoview in am to exclude significant reversible ischemia. If normal, DC and proceed with her outpatient GI evaluation in [place].

## 2015-06-08 NOTE — Progress Notes (Signed)
ANTICOAGULATION CONSULT NOTE - Initial Consult  Pharmacy Consult for Heparin Indication: chest pain/ACS  Allergies  Allergen Reactions  . Diclofenac Sodium Anaphylaxis    Tolerates aspirin - was told not to take antiinflammatory meds.  . Meperidine-Promethazine     headache  . Ramipril     cough  . Raspberry Hives  . Codeine Rash  . Prednisone Palpitations  . Sulfa Antibiotics Rash    Patient Measurements: Height: 5\' 3"  (160 cm) Weight: 172 lb (78.019 kg) IBW/kg (Calculated) : 52.4 Heparin Dosing Weight: 69.3 kg  Vital Signs: Temp: 98.1 F (36.7 C) (08/31 1725) Temp Source: Oral (08/31 1725) BP: 138/83 mmHg (08/31 1725) Pulse Rate: 70 (08/31 1725)  Labs: No results for input(s): HGB, HCT, PLT, APTT, LABPROT, INR, HEPARINUNFRC, CREATININE, CKTOTAL, CKMB, TROPONINI in the last 72 hours.  CrCl cannot be calculated (Patient has no serum creatinine result on file.).   Medical History: Past Medical History  Diagnosis Date  . GERD (gastroesophageal reflux disease)   . Fibromyalgia   . NSTEMI (non-ST elevated myocardial infarction)     a. NSTEMI 12/2011 with cath showing smooth/normal cors, ? MI secondary to possible transient spasm with thrombosis since she did have some hypokinesis in the mid inferior wall the overall ejection fraction was 65%, (? versus small branch occlusion off the RCA). b. Nuc 11/2013 - normal.  . Anxiety   . Jaw pain     Has been told she has bone loss by dentist.  . Osteoarthritis   . Hypertension   . H/O hiatal hernia   . Chronic back pain     "from the neck down" (07/21/2013)  . Diverticulosis     Medications:  Prescriptions prior to admission  Medication Sig Dispense Refill Last Dose  . acetaminophen (TYLENOL) 500 MG tablet Take 1,000 mg by mouth every 6 (six) hours as needed (pain).   Taking  . amLODipine (NORVASC) 5 MG tablet TAKE ONE TABLET BY MOUTH ONCE DAILY 30 tablet 9 Taking  . aspirin EC 81 MG tablet Take 81 mg by mouth at  bedtime.   Taking  . Cholecalciferol (VITAMIN D) 2000 UNITS tablet Take 2,000 Units by mouth daily.   Taking  . diazepam (VALIUM) 2 MG tablet Take 2 mg by mouth daily.   Taking  . dicyclomine (BENTYL) 10 MG capsule Take 10 mg by mouth up to 3 times a day as needed for pain 90 capsule 1   . isosorbide mononitrate (IMDUR) 30 MG 24 hr tablet TAKE ONE TABLET BY MOUTH ONCE DAILY 30 tablet 11 Taking  . losartan (COZAAR) 25 MG tablet Take 1 tablet (25 mg total) by mouth daily. 30 tablet 11 Taking  . metoprolol tartrate (LOPRESSOR) 25 MG tablet Take 0.5 tablets (12.5 mg total) by mouth 2 (two) times daily. 30 tablet 11 Taking  . nitroGLYCERIN (NITROSTAT) 0.4 MG SL tablet Place 1 tablet (0.4 mg total) under the tongue every 5 (five) minutes as needed for chest pain (up to 3 doses). 25 tablet 3 Taking  . OVER THE COUNTER MEDICATION IBGUARD - prn   Taking  . pantoprazole (PROTONIX) 40 MG tablet Take 40 mg by mouth 2 (two) times daily before a meal.   Taking  . pravastatin (PRAVACHOL) 20 MG tablet TAKE ONE TABLET BY MOUTH ONCE DAILY 30 tablet 11 Taking  . ranitidine (ZANTAC) 150 MG tablet Take 1 tablet (150 mg total) by mouth at bedtime.     . sucralfate (CARAFATE) 1 G tablet Take 1 tablet (  1 g total) by mouth 4 (four) times daily -  with meals and at bedtime. 120 tablet 0   . Wheat Dextrin (BENEFIBER PO) Take by mouth 2 (two) times daily.   Taking    Assessment: 64 yo F presented to [facility] ED with CP.  ECG and Trop there were wnl.  Pt was transferred to [facility] with persistent CP.  Labs from [facility] Hgb 13.2, Hct 39.9, PLTC 260, INR 0.9, K 4.1, SCr 0.83  Pt received a 4000 units IV bolus x 1 and was started on a heparin infusion at 1000 units/hr prior to transfer.  [facility] to confirm this medication was started at 1517 today.  PMH: GERD, CAD, HTN, anxiety  Goal of Therapy:  Heparin level 0.3-0.7 units/ml Monitor platelets by anticoagulation protocol: Yes   Plan:  Continue heparin  at 1000 units/hr (dose ~ 14 units/kg/hr). Check heparin level at 2200 tonight. Daily heparin level and CBC while on heparin.  [employer], Pharm.D., BCPS Clinical Pharmacist Pager [phone] 06/08/2015 7:19 PM

## 2015-06-09 ENCOUNTER — Encounter (HOSPITAL_COMMUNITY)
Admission: AD | Disposition: A | Discharge status: Home / Self Care | Payer: PRIVATE HEALTH INSURANCE | Source: Other Acute Inpatient Hospital | Attending: Cardiology

## 2015-06-09 ENCOUNTER — Ambulatory Visit (HOSPITAL_COMMUNITY)
Admission: RE | Admit: 2015-06-09 | Payer: PRIVATE HEALTH INSURANCE | Source: Ambulatory Visit | Admitting: Internal Medicine

## 2015-06-09 ENCOUNTER — Encounter (HOSPITAL_COMMUNITY): Payer: Self-pay | Admitting: Interventional Cardiology

## 2015-06-09 ENCOUNTER — Other Ambulatory Visit (INDEPENDENT_AMBULATORY_CARE_PROVIDER_SITE_OTHER): Payer: Self-pay | Admitting: *Deleted

## 2015-06-09 ENCOUNTER — Encounter (HOSPITAL_COMMUNITY): Admission: RE | Payer: Self-pay | Source: Ambulatory Visit

## 2015-06-09 DIAGNOSIS — G8929 Other chronic pain: Secondary | ICD-10-CM

## 2015-06-09 DIAGNOSIS — I200 Unstable angina: Secondary | ICD-10-CM

## 2015-06-09 DIAGNOSIS — R1013 Epigastric pain: Principal | ICD-10-CM

## 2015-06-09 HISTORY — PX: CARDIAC CATHETERIZATION: SHX172

## 2015-06-09 LAB — CBC
HCT: 35.4 % — ABNORMAL LOW (ref 36.0–46.0)
HEMOGLOBIN: 11.5 g/dL — AB (ref 12.0–15.0)
MCH: 30.2 pg (ref 26.0–34.0)
MCHC: 32.5 g/dL (ref 30.0–36.0)
MCV: 92.9 fL (ref 78.0–100.0)
PLATELETS: 213 10*3/uL (ref 150–400)
RBC: 3.81 MIL/uL — AB (ref 3.87–5.11)
RDW: 12.7 % (ref 11.5–15.5)
WBC: 6.9 10*3/uL (ref 4.0–10.5)

## 2015-06-09 LAB — BASIC METABOLIC PANEL
ANION GAP: 5 (ref 5–15)
BUN: 12 mg/dL (ref 6–20)
CALCIUM: 9.2 mg/dL (ref 8.9–10.3)
CO2: 28 mmol/L (ref 22–32)
CREATININE: 0.81 mg/dL (ref 0.44–1.00)
Chloride: 108 mmol/L (ref 101–111)
GFR calc Af Amer: >60 mL/min (ref >60)
GLUCOSE: 106 mg/dL — AB (ref 65–99)
Potassium: 4.2 mmol/L (ref 3.5–5.1)
Sodium: 141 mmol/L (ref 135–145)

## 2015-06-09 LAB — TROPONIN I
Troponin I: 0.25 ng/mL — ABNORMAL HIGH (ref <0.031)
Troponin I: 0.12 ng/mL — ABNORMAL HIGH (ref <0.031)

## 2015-06-09 LAB — LIPID PANEL
CHOL/HDL RATIO: 2.5 ratio
CHOLESTEROL: 163 mg/dL (ref 0–200)
HDL: 66 mg/dL (ref >40)
LDL Cholesterol: 85 mg/dL (ref 0–99)
Triglycerides: 62 mg/dL (ref <150)
VLDL: 12 mg/dL (ref 0–40)

## 2015-06-09 LAB — POCT I-STAT, CHEM 8
BUN: 15 mg/dL (ref 6–20)
CHLORIDE: 106 mmol/L (ref 101–111)
Calcium, Ion: 1.25 mmol/L (ref 1.13–1.30)
Creatinine, Ser: 0.8 mg/dL (ref 0.44–1.00)
Glucose, Bld: 121 mg/dL — ABNORMAL HIGH (ref 65–99)
HEMATOCRIT: 36 % (ref 36.0–46.0)
HEMOGLOBIN: 12.2 g/dL (ref 12.0–15.0)
POTASSIUM: 3.9 mmol/L (ref 3.5–5.1)
Sodium: 141 mmol/L (ref 135–145)
TCO2: 25 mmol/L (ref 0–100)

## 2015-06-09 LAB — PROTIME-INR
INR: 1 (ref 0.00–1.49)
Prothrombin Time: 13.4 seconds (ref 11.6–15.2)

## 2015-06-09 LAB — HEPARIN LEVEL (UNFRACTIONATED): HEPARIN UNFRACTIONATED: 0.7 [IU]/mL (ref 0.30–0.70)

## 2015-06-09 SURGERY — EGD (ESOPHAGOGASTRODUODENOSCOPY)
Anesthesia: Moderate Sedation

## 2015-06-09 SURGERY — LEFT HEART CATH AND CORONARY ANGIOGRAPHY
Anesthesia: LOCAL

## 2015-06-09 MED ORDER — HEPARIN SODIUM (PORCINE) 1000 UNIT/ML IJ SOLN
INTRAMUSCULAR | Status: DC | PRN
  Administered 2015-06-09: 4000 [IU] via INTRAVENOUS

## 2015-06-09 MED ORDER — NITROGLYCERIN IN D5W 200-5 MCG/ML-% IV SOLN
INTRAVENOUS | Status: AC
  Filled 2015-06-09: qty 250

## 2015-06-09 MED ORDER — FENTANYL CITRATE (PF) 100 MCG/2ML IJ SOLN
INTRAMUSCULAR | Status: AC
  Filled 2015-06-09: qty 4

## 2015-06-09 MED ORDER — ASPIRIN 81 MG PO CHEW
81.0000 mg | CHEWABLE_TABLET | ORAL | Status: AC
  Administered 2015-06-09: 81 mg via ORAL
  Filled 2015-06-09: qty 1

## 2015-06-09 MED ORDER — SODIUM CHLORIDE 0.9 % WEIGHT BASED INFUSION
3.0000 mL/kg/h | INTRAVENOUS | Status: DC
Start: 2015-06-09 — End: 2015-06-09

## 2015-06-09 MED ORDER — LIDOCAINE HCL (PF) 1 % IJ SOLN
INTRAMUSCULAR | Status: AC
  Filled 2015-06-09: qty 30

## 2015-06-09 MED ORDER — SODIUM CHLORIDE 0.9 % IJ SOLN: 3.0000 mL | Freq: Two times a day (BID) | INTRAMUSCULAR | Status: DC

## 2015-06-09 MED ORDER — LIDOCAINE HCL (PF) 1 % IJ SOLN
INTRAMUSCULAR | Status: DC | PRN
  Administered 2015-06-09: 11:00:00

## 2015-06-09 MED ORDER — VERAPAMIL HCL 2.5 MG/ML IV SOLN
INTRAVENOUS | Status: DC | PRN
  Administered 2015-06-09: 11:00:00 via INTRA_ARTERIAL

## 2015-06-09 MED ORDER — IOHEXOL 350 MG/ML SOLN
INTRAVENOUS | Status: DC | PRN
  Administered 2015-06-09: 55 mL via INTRA_ARTERIAL

## 2015-06-09 MED ORDER — SODIUM CHLORIDE 0.9 % WEIGHT BASED INFUSION: 3.0000 mL/kg/h | INTRAVENOUS | Status: AC

## 2015-06-09 MED ORDER — MIDAZOLAM HCL 2 MG/2ML IJ SOLN
INTRAMUSCULAR | Status: AC
Start: 2015-06-09 — End: 2015-06-09
  Filled 2015-06-09: qty 4

## 2015-06-09 MED ORDER — HEPARIN SODIUM (PORCINE) 1000 UNIT/ML IJ SOLN
INTRAMUSCULAR | Status: AC
  Filled 2015-06-09: qty 1

## 2015-06-09 MED ORDER — MIDAZOLAM HCL 2 MG/2ML IJ SOLN
INTRAMUSCULAR | Status: DC | PRN
  Administered 2015-06-09: 2 mg via INTRAVENOUS
  Administered 2015-06-09: 1 mg via INTRAVENOUS

## 2015-06-09 MED ORDER — FENTANYL CITRATE (PF) 100 MCG/2ML IJ SOLN
INTRAMUSCULAR | Status: DC | PRN
  Administered 2015-06-09 (×2): 25 ug via INTRAVENOUS

## 2015-06-09 MED ORDER — SODIUM CHLORIDE 0.9 % IV SOLN: 250.0000 mL | INTRAVENOUS | Status: DC | PRN

## 2015-06-09 MED ORDER — NITROGLYCERIN 1 MG/10 ML FOR IR/CATH LAB
INTRA_ARTERIAL | Status: AC
  Filled 2015-06-09: qty 10

## 2015-06-09 MED ORDER — ONDANSETRON HCL 4 MG/2ML IJ SOLN: 4.0000 mg | Freq: Four times a day (QID) | INTRAMUSCULAR | Status: DC | PRN

## 2015-06-09 MED ORDER — VERAPAMIL HCL 2.5 MG/ML IV SOLN
INTRAVENOUS | Status: AC
  Filled 2015-06-09: qty 2

## 2015-06-09 MED ORDER — HEPARIN (PORCINE) IN NACL 2-0.9 UNIT/ML-% IJ SOLN
INTRAMUSCULAR | Status: AC
  Filled 2015-06-09: qty 1500

## 2015-06-09 MED ORDER — NITROGLYCERIN IN D5W 200-5 MCG/ML-% IV SOLN
0.0000 ug/min | INTRAVENOUS | Status: DC
  Administered 2015-06-09: 5 ug/min via INTRAVENOUS

## 2015-06-09 MED ORDER — SODIUM CHLORIDE 0.9 % IJ SOLN: 3.0000 mL | INTRAMUSCULAR | Status: DC | PRN

## 2015-06-09 MED ORDER — SODIUM CHLORIDE 0.9 % WEIGHT BASED INFUSION
1.0000 mL/kg/h | INTRAVENOUS | Status: DC
Start: 2015-06-09 — End: 2015-06-09

## 2015-06-09 MED ORDER — HEPARIN (PORCINE) IN NACL 100-0.45 UNIT/ML-% IJ SOLN
800.0000 [IU]/h | INTRAMUSCULAR | Status: DC
  Administered 2015-06-09: 800 [IU]/h via INTRAVENOUS

## 2015-06-09 MED ORDER — DOCUSATE SODIUM 100 MG PO CAPS: 100.0000 mg | ORAL_CAPSULE | Freq: Every day | ORAL | Status: DC

## 2015-06-09 MED ORDER — MIDAZOLAM HCL 2 MG/2ML IJ SOLN
INTRAMUSCULAR | Status: AC
  Filled 2015-06-09: qty 4

## 2015-06-09 MED ORDER — ACETAMINOPHEN 325 MG PO TABS: 650.0000 mg | ORAL_TABLET | ORAL | Status: DC | PRN

## 2015-06-09 SURGICAL SUPPLY — 11 items

## 2015-06-09 NOTE — Progress Notes (Signed)
UR Completed Rohail Klees Graves-Bigelow, RN,BSN [phone]  

## 2015-06-09 NOTE — Discharge Instructions (Signed)
PLEASE REMEMBER TO BRING ALL OF YOUR MEDICATIONS TO EACH OF YOUR FOLLOW-UP OFFICE VISITS.  PLEASE ATTEND ALL SCHEDULED FOLLOW-UP APPOINTMENTS.   Activity: Increase activity slowly as tolerated. You may shower, but no soaking baths (or swimming) for 1 week. No driving for 24 hours. No lifting over 5 lbs for 1 week. No sexual activity for 1 week.   You May Return to Work: in 1 week (if applicable)  Wound Care: You may wash cath site gently with soap and water. Keep cath site clean and dry. If you notice pain, swelling, bleeding or pus at your cath site, please call [phone].   PER ANN AT DR. Olevia Perches OFFICE, YOUR ENDOSCOPY IS SCHEDULED AT [place] ON FRIDAY, 06/10/2015 AT 12:40PM.   FOR THE PROCEDURE, DO NOT EAT AFTER 8:00PM TONIGHT. YOU CAN HAVE CLEAR LIQUIDS UNTIL 8:00AM TOMORROW MORNING. NO LIQUIDS AT ALL AFTER 8:00AM TOMORROW MORNING.  ALSO, DO NOT TAKE ANY ASPIRIN TOMORROW. RESUME ASPIRIN ON 06/11/2015.  ANY QUESTIONS ABOUT YOUR PROCEDURE, PLEASE CALL ANN AT [phone].

## 2015-06-09 NOTE — Progress Notes (Signed)
Patient Name: Jenna Hernandez Date of Encounter: 06/09/2015     Active Problems:   Midsternal chest pain    SUBJECTIVE  Patient has had no further chest pain during the night. On IV heparin.Second set of enzymes positive.  CURRENT MEDS . amLODipine  5 mg Oral Daily  . aspirin EC  81 mg Oral QHS  . diazepam  2 mg Oral Daily  . famotidine  20 mg Oral QHS  . isosorbide mononitrate  30 mg Oral Daily  . losartan  25 mg Oral Daily  . pantoprazole  40 mg Oral BID AC  . pravastatin  20 mg Oral q1800  . sodium chloride  3 mL Intravenous Q12H  . sucralfate  1 g Oral TID WC & HS    OBJECTIVE  Filed Vitals:   06/08/15 1725 06/08/15 2040 06/09/15 0024 06/09/15 0458  BP: 138/83 141/72 109/49 131/60  Pulse: 70 69 64 62  Temp: 98.1 F (36.7 C) 98.2 F (36.8 C) 98.1 F (36.7 C) 97.8 F (36.6 C)  TempSrc: Oral Oral Oral Oral  Height: 5\' 3"  (1.6 m)     Weight: 172 lb (78.019 kg)   173 lb 11.2 oz (78.79 kg)  SpO2: 96% 93% 93% 96%    Intake/Output Summary (Last 24 hours) at 06/09/15 0801 Last data filed at 06/08/15 2030  Gross per 24 hour  Intake    120 ml  Output      0 ml  Net    120 ml   Filed Weights   06/08/15 1725 06/09/15 0458  Weight: 172 lb (78.019 kg) 173 lb 11.2 oz (78.79 kg)    PHYSICAL EXAM  General: Pleasant, NAD. Neuro: Alert and oriented X 3. Moves all extremities spontaneously. Psych: Normal affect. HEENT:  Normal  Neck: Supple without bruits or JVD. Lungs:  Resp regular and unlabored, CTA. Heart: RRR no s3, s4, or murmurs. Abdomen: Soft, non-tender, non-distended, BS + x 4.  Extremities: No clubbing, cyanosis or edema. DP/PT/Radials 2+ and equal bilaterally.  Accessory Clinical Findings  CBC  Recent Labs  06/09/15 0105  WBC 6.9  HGB 11.5*  HCT 35.4*  MCV 92.9  PLT 884   Basic Metabolic Panel No results for input(s): NA, K, CL, CO2, GLUCOSE, BUN, CREATININE, CALCIUM, MG, PHOS in the last 72 hours. Liver Function Tests No results for  input(s): AST, ALT, ALKPHOS, BILITOT, PROT, ALBUMIN in the last 72 hours. No results for input(s): LIPASE, AMYLASE in the last 72 hours. Cardiac Enzymes  Recent Labs  06/08/15 1947 06/09/15 0105  TROPONINI <0.03 0.25*   BNP Invalid input(s): POCBNP D-Dimer No results for input(s): DDIMER in the last 72 hours. Hemoglobin A1C No results for input(s): HGBA1C in the last 72 hours. Fasting Lipid Panel  Recent Labs  06/09/15 0105  CHOL 163  HDL 66  LDLCALC 85  TRIG 62  CHOLHDL 2.5   Thyroid Function Tests No results for input(s): TSH, T4TOTAL, T3FREE, THYROIDAB in the last 72 hours.  Invalid input(s): FREET3  TELE  NSR  ECG  New inferior wall T wave inversion since prior tracing.  Radiology/Studies  No results found.  ASSESSMENT AND PLAN 1. Midsternal chest and jaw pain: Pt presents on tx from [facility] following visit for several hours of midsternal chest and jaw pain. ECG non-acute. Second troponin is positive. EKG this am shows new T wave inversion in III. Will cancel myoview and get left heart cath.  2. HTN: Stable. Cont home meds.  3.  HL: Cont statin Rx.  4. GERD: Cont BID PPI.   Signed, Darlin Coco MD

## 2015-06-09 NOTE — Progress Notes (Signed)
Pt is experiencing Jaw pain 5/10, she also reports some chest tightness. BP 165/84 P 91 O2 sat 98% RA. Pt received Nitro 0.4 SL per PRN order. EKG in process. MD was paged. Will continue to monitor closely.  Lowell Guitar

## 2015-06-09 NOTE — Progress Notes (Signed)
ANTICOAGULATION CONSULT NOTE - Follow Up Consult  Pharmacy Consult for heparin Indication: chest pain/ACS   Labs:  Recent Labs  06/08/15 1947 06/08/15 2321  HEPARINUNFRC  --  1.10*  TROPONINI <0.03  --      Assessment: 64yo female supratherapeutic on heparin with initial dosing for CP.  Goal of Therapy:  Heparin level 0.3-0.7 units/ml   Plan:  Will hold heparin gtt x39min then decrease heparin gtt by 2-3 units/kg/hr to 800 units/hr and check level in 6hr.  Wynona Neat, PharmD, BCPS  06/09/2015,12:26 AM

## 2015-06-09 NOTE — Telephone Encounter (Signed)
Cardiac workup is negative and patient has been rescheduled for EGD in a.m.

## 2015-06-09 NOTE — H&P (View-Only) (Signed)
Patient Name: Jenna Hernandez Date of Encounter: 06/09/2015     Active Problems:   Midsternal chest pain    SUBJECTIVE  Patient has had no further chest pain during the night. On IV heparin.Second set of enzymes positive.  CURRENT MEDS . amLODipine  5 mg Oral Daily  . aspirin EC  81 mg Oral QHS  . diazepam  2 mg Oral Daily  . famotidine  20 mg Oral QHS  . isosorbide mononitrate  30 mg Oral Daily  . losartan  25 mg Oral Daily  . pantoprazole  40 mg Oral BID AC  . pravastatin  20 mg Oral q1800  . sodium chloride  3 mL Intravenous Q12H  . sucralfate  1 g Oral TID WC & HS    OBJECTIVE  Filed Vitals:   06/08/15 1725 06/08/15 2040 06/09/15 0024 06/09/15 0458  BP: 138/83 141/72 109/49 131/60  Pulse: 70 69 64 62  Temp: 98.1 F (36.7 C) 98.2 F (36.8 C) 98.1 F (36.7 C) 97.8 F (36.6 C)  TempSrc: Oral Oral Oral Oral  Height: 5\' 3"  (1.6 m)     Weight: 172 lb (78.019 kg)   173 lb 11.2 oz (78.79 kg)  SpO2: 96% 93% 93% 96%    Intake/Output Summary (Last 24 hours) at 06/09/15 0801 Last data filed at 06/08/15 2030  Gross per 24 hour  Intake    120 ml  Output      0 ml  Net    120 ml   Filed Weights   06/08/15 1725 06/09/15 0458  Weight: 172 lb (78.019 kg) 173 lb 11.2 oz (78.79 kg)    PHYSICAL EXAM  General: Pleasant, NAD. Neuro: Alert and oriented X 3. Moves all extremities spontaneously. Psych: Normal affect. HEENT:  Normal  Neck: Supple without bruits or JVD. Lungs:  Resp regular and unlabored, CTA. Heart: RRR no s3, s4, or murmurs. Abdomen: Soft, non-tender, non-distended, BS + x 4.  Extremities: No clubbing, cyanosis or edema. DP/PT/Radials 2+ and equal bilaterally.  Accessory Clinical Findings  CBC  Recent Labs  06/09/15 0105  WBC 6.9  HGB 11.5*  HCT 35.4*  MCV 92.9  PLT 308   Basic Metabolic Panel No results for input(s): NA, K, CL, CO2, GLUCOSE, BUN, CREATININE, CALCIUM, MG, PHOS in the last 72 hours. Liver Function Tests No results for  input(s): AST, ALT, ALKPHOS, BILITOT, PROT, ALBUMIN in the last 72 hours. No results for input(s): LIPASE, AMYLASE in the last 72 hours. Cardiac Enzymes  Recent Labs  06/08/15 1947 06/09/15 0105  TROPONINI <0.03 0.25*   BNP Invalid input(s): POCBNP D-Dimer No results for input(s): DDIMER in the last 72 hours. Hemoglobin A1C No results for input(s): HGBA1C in the last 72 hours. Fasting Lipid Panel  Recent Labs  06/09/15 0105  CHOL 163  HDL 66  LDLCALC 85  TRIG 62  CHOLHDL 2.5   Thyroid Function Tests No results for input(s): TSH, T4TOTAL, T3FREE, THYROIDAB in the last 72 hours.  Invalid input(s): FREET3  TELE  NSR  ECG  New inferior wall T wave inversion since prior tracing.  Radiology/Studies  No results found.  ASSESSMENT AND PLAN 1. Midsternal chest and jaw pain: Pt presents on tx from [facility] following visit for several hours of midsternal chest and jaw pain. ECG non-acute. Second troponin is positive. EKG this am shows new T wave inversion in III. Will cancel myoview and get left heart cath.  2. HTN: Stable. Cont home meds.  3.  HL: Cont statin Rx.  4. GERD: Cont BID PPI.   Signed, Darlin Coco MD

## 2015-06-09 NOTE — Interval H&P Note (Signed)
Cath Lab Visit (complete for each Cath Lab visit)  Clinical Evaluation Leading to the Procedure:   ACS: Yes.    Non-ACS:    Anginal Classification: CCS IV  Anti-ischemic medical therapy: Minimal Therapy (1 class of medications)  Non-Invasive Test Results: No non-invasive testing performed  Prior CABG: No previous CABG      History and Physical Interval Note:  06/09/2015 10:53 AM  Jenna Hernandez  has presented today for surgery, with the diagnosis of cp  The various methods of treatment have been discussed with the patient and family. After consideration of risks, benefits and other options for treatment, the patient has consented to  Procedure(s): Left Heart Cath and Coronary Angiography (N/A) as a surgical intervention .  The patient's history has been reviewed, patient examined, no change in status, stable for surgery.  I have reviewed the patient's chart and labs.  Questions were answered to the patient's satisfaction.     Salvatore Poe S.

## 2015-06-09 NOTE — Care Management Note (Addendum)
Case Management Note  Patient Details  Name: Jenna Hernandez MRN: [id number] Date of Birth: Mar 29, 1951  Subjective/Objective:  Pt admitted for chest pain. Initiated on IV heparin gtt. Transfer from [place] for cardiac cath today.                   Action/Plan: No needs identified by CM at this time. Will continue to monitor for additional needs.    Expected Discharge Date:                  Expected Discharge Plan:  Home/Self Care  In-House Referral:     Discharge planning Services  CM Consult  Post Acute Care Choice:    Choice offered to:     DME Arranged:    DME Agency:     HH Arranged:    HH Agency:     Status of Service:  In process, will continue to follow  Medicare Important Message Given:    Date Medicare IM Given:    Medicare IM give by:    Date Additional Medicare IM Given:    Additional Medicare Important Message give by:     If discussed at [employer] of Stay Meetings, dates discussed:    Additional Comments:  Bethena Roys, RN 06/09/2015, 10:26 AM

## 2015-06-09 NOTE — Discharge Summary (Signed)
CARDIOLOGY DISCHARGE SUMMARY   Patient ID: Jenna Hernandez MRN: [id number] DOB/AGE: 65-02-1951 65 y.o.  Admit date: 06/08/2015 Discharge date: 06/09/2015  PCP: Manon Hilding, MD Primary Cardiologist: Dr. Mare Ferrari  Primary Discharge Diagnosis:  Unstable Angina Secondary Discharge Diagnosis: HTN, HLD, GERD   Consults: None  Procedures: Left Heart Catheterization and Coronary Angiography  Hospital Course: Jenna Hernandez is a 64 y.o. female with a history of NSTEMI (12/2013 - small branch occlusion off of RCA), HTN, and HLD who was transferred form [facility] on 06/08/2015 for further treatment of her chest pain she developed earlier that day at work.   At [facility] she was treated with ASA and NTG. EKG and troponin values were normal.  Upon arrival to the ED, her chest pain had mostly resolved but she was having intermittent jaw pain. She was started on IV Heparin per pharmacy dosing.   Overnight, she had no further chest pain. However, a new T-wave inversion in Lead III was noted. Her troponin also increased to 0.25 and 0.12 respectively.  It was decided cardiac cath was the best option for the patient.  She did have an episode of 5/10 jaw pain this morning and some chest tightness. 0.4 SL of NTG was administered.  She was taken to the cardiac catheterization lab by Dr. Irish Lack. The risks and benefits of the procedure were explained in detail to the patient. The cath showed normal systolic function of 85-88% and no wall motion abnormalities were identified. No significant CAD was identified.  The patient experienced no complications following the procedure.  Patient was last examined by Dr. Mare Ferrari and deemed stable for discharge.  She already has scheduled office visit follow-up on July 21, 2015. She was informed to call the office if she experiences any recurrent anginal symptoms or complications from her catheterization prior to that appointment.  The patient  was concerned about missing her scheduled EGD at [facility] today. Ann at Dr. Olevia Perches office was able to schedule the patient for her EGD on 06/10/2015. He was informed she received ASA and Heparin today. Instructions for stopping food consumption at 8:00PM tonight and only having clear liquids until 8:00 AM tomorrow morning and nothing after 8:00 were passed along to the patient verbally as well as included in her discharge summary.  Labs:   Lab Results  Component Value Date   WBC 6.9 06/09/2015   HGB 12.2 06/09/2015   HCT 36.0 06/09/2015   MCV 92.9 06/09/2015   PLT 213 06/09/2015     Recent Labs  06/08/15 1947 06/09/15 0105 06/09/15 0815  TROPONINI <0.03 0.25* 0.12*   Lipid Panel     Component Value Date/Time   CHOL 163 06/09/2015 0105   TRIG 62 06/09/2015 0105   HDL 66 06/09/2015 0105   CHOLHDL 2.5 06/09/2015 0105   VLDL 12 06/09/2015 0105   LDLCALC 85 06/09/2015 0105     Recent Labs  06/09/15 0815  INR 1.00       Cardiac Cath: 06/09/2015  The left ventricular systolic function is normal. EF 55-65%.  No significant CAD. No wall motion abnormalities in the left ventricle.  Continue preventative therapy. Inpatient team to decide whether further testing is needed.    FOLLOW UP PLANS AND APPOINTMENTS Allergies  Allergen Reactions  . Diclofenac Sodium Anaphylaxis    Tolerates aspirin - was told not to take antiinflammatory meds.  . Meperidine-Promethazine     headache  . Ramipril     cough  .  Raspberry Hives  . Codeine Rash  . Prednisone Palpitations  . Sulfa Antibiotics Rash     Medication List    TAKE these medications        acetaminophen 500 MG tablet  Commonly known as:  TYLENOL  Take 1,000 mg by mouth every 6 (six) hours as needed (pain).     amLODipine 5 MG tablet  Commonly known as:  NORVASC  TAKE ONE TABLET BY MOUTH ONCE DAILY     aspirin EC 81 MG tablet  Take 81 mg by mouth at bedtime.     BENEFIBER PO  Take by mouth daily.      diazepam 2 MG tablet  Commonly known as:  VALIUM  Take 1 mg by mouth at bedtime.     dicyclomine 10 MG capsule  Commonly known as:  BENTYL  Take 10 mg by mouth up to 3 times a day as needed for pain     docusate sodium 100 MG capsule  Commonly known as:  COLACE  Take 100 mg by mouth daily.     isosorbide mononitrate 30 MG 24 hr tablet  Commonly known as:  IMDUR  TAKE ONE TABLET BY MOUTH ONCE DAILY     losartan 25 MG tablet  Commonly known as:  COZAAR  Take 1 tablet (25 mg total) by mouth daily.     metoprolol tartrate 25 MG tablet  Commonly known as:  LOPRESSOR  Take 0.5 tablets (12.5 mg total) by mouth 2 (two) times daily.     nitroGLYCERIN 0.4 MG SL tablet  Commonly known as:  NITROSTAT  Place 1 tablet (0.4 mg total) under the tongue every 5 (five) minutes as needed for chest pain (up to 3 doses).     pantoprazole 40 MG tablet  Commonly known as:  PROTONIX  Take 40 mg by mouth 2 (two) times daily before a meal.     pravastatin 20 MG tablet  Commonly known as:  PRAVACHOL  TAKE ONE TABLET BY MOUTH ONCE DAILY     ranitidine 150 MG tablet  Commonly known as:  ZANTAC  Take 1 tablet (150 mg total) by mouth at bedtime.     Vitamin D 2000 UNITS tablet  Take 2,000 Units by mouth daily.         Follow-up Information    Follow up with Warren Danes, MD On 07/21/2015.   Specialty:  Cardiology   Why:  Follow-Up Appointment on 07/21/2015 at 3:00PM   Contact information:   [address] [place] [address] [phone]       [place] TO FOLLOW UP APPOINTMENTS  Time spent with patient to include physician time: > 30 minutes  Signed: Dineen Kid, PA 06/09/2015, 2:30 PM Co-Sign MD

## 2015-06-10 ENCOUNTER — Encounter (HOSPITAL_COMMUNITY): Payer: Self-pay | Admitting: *Deleted

## 2015-06-10 ENCOUNTER — Encounter (HOSPITAL_COMMUNITY)
Admission: RE | Disposition: A | Discharge status: Home / Self Care | Payer: Self-pay | Source: Ambulatory Visit | Attending: Internal Medicine

## 2015-06-10 ENCOUNTER — Ambulatory Visit (HOSPITAL_COMMUNITY)
Admission: RE | Admit: 2015-06-10 | Discharge: 2015-06-10 | Disposition: A | Discharge status: Home / Self Care | Payer: PRIVATE HEALTH INSURANCE | Source: Ambulatory Visit | Attending: Internal Medicine | Admitting: Internal Medicine

## 2015-06-10 DIAGNOSIS — R072 Precordial pain: Secondary | ICD-10-CM | POA: Insufficient documentation

## 2015-06-10 DIAGNOSIS — R12 Heartburn: Secondary | ICD-10-CM | POA: Diagnosis not present

## 2015-06-10 DIAGNOSIS — Z7982 Long term (current) use of aspirin: Secondary | ICD-10-CM | POA: Diagnosis not present

## 2015-06-10 DIAGNOSIS — I25110 Atherosclerotic heart disease of native coronary artery with unstable angina pectoris: Secondary | ICD-10-CM | POA: Diagnosis not present

## 2015-06-10 DIAGNOSIS — Z882 Allergy status to sulfonamides status: Secondary | ICD-10-CM | POA: Insufficient documentation

## 2015-06-10 DIAGNOSIS — K2960 Other gastritis without bleeding: Secondary | ICD-10-CM | POA: Diagnosis not present

## 2015-06-10 DIAGNOSIS — K2970 Gastritis, unspecified, without bleeding: Secondary | ICD-10-CM | POA: Diagnosis not present

## 2015-06-10 DIAGNOSIS — Z87891 Personal history of nicotine dependence: Secondary | ICD-10-CM | POA: Insufficient documentation

## 2015-06-10 DIAGNOSIS — M549 Dorsalgia, unspecified: Secondary | ICD-10-CM | POA: Insufficient documentation

## 2015-06-10 DIAGNOSIS — R05 Cough: Secondary | ICD-10-CM | POA: Insufficient documentation

## 2015-06-10 DIAGNOSIS — Z8719 Personal history of other diseases of the digestive system: Secondary | ICD-10-CM | POA: Insufficient documentation

## 2015-06-10 DIAGNOSIS — K449 Diaphragmatic hernia without obstruction or gangrene: Secondary | ICD-10-CM | POA: Diagnosis not present

## 2015-06-10 DIAGNOSIS — E780 Pure hypercholesterolemia: Secondary | ICD-10-CM | POA: Diagnosis not present

## 2015-06-10 DIAGNOSIS — Z888 Allergy status to other drugs, medicaments and biological substances status: Secondary | ICD-10-CM | POA: Insufficient documentation

## 2015-06-10 DIAGNOSIS — F419 Anxiety disorder, unspecified: Secondary | ICD-10-CM | POA: Diagnosis not present

## 2015-06-10 DIAGNOSIS — I252 Old myocardial infarction: Secondary | ICD-10-CM | POA: Diagnosis not present

## 2015-06-10 DIAGNOSIS — I10 Essential (primary) hypertension: Secondary | ICD-10-CM | POA: Diagnosis not present

## 2015-06-10 DIAGNOSIS — Z90710 Acquired absence of both cervix and uterus: Secondary | ICD-10-CM | POA: Insufficient documentation

## 2015-06-10 DIAGNOSIS — M1990 Unspecified osteoarthritis, unspecified site: Secondary | ICD-10-CM | POA: Insufficient documentation

## 2015-06-10 DIAGNOSIS — G8929 Other chronic pain: Secondary | ICD-10-CM | POA: Diagnosis not present

## 2015-06-10 DIAGNOSIS — Z885 Allergy status to narcotic agent status: Secondary | ICD-10-CM | POA: Diagnosis not present

## 2015-06-10 DIAGNOSIS — Z91018 Allergy to other foods: Secondary | ICD-10-CM | POA: Diagnosis not present

## 2015-06-10 DIAGNOSIS — M797 Fibromyalgia: Secondary | ICD-10-CM | POA: Diagnosis not present

## 2015-06-10 DIAGNOSIS — K219 Gastro-esophageal reflux disease without esophagitis: Secondary | ICD-10-CM | POA: Diagnosis not present

## 2015-06-10 DIAGNOSIS — R1013 Epigastric pain: Secondary | ICD-10-CM | POA: Diagnosis not present

## 2015-06-10 HISTORY — PX: ESOPHAGOGASTRODUODENOSCOPY: SHX5428

## 2015-06-10 SURGERY — EGD (ESOPHAGOGASTRODUODENOSCOPY)
Anesthesia: Moderate Sedation

## 2015-06-10 MED ORDER — MIDAZOLAM HCL 5 MG/5ML IJ SOLN
INTRAMUSCULAR | Status: AC
  Filled 2015-06-10: qty 10

## 2015-06-10 MED ORDER — FENTANYL CITRATE (PF) 100 MCG/2ML IJ SOLN
INTRAMUSCULAR | Status: AC
  Filled 2015-06-10: qty 2

## 2015-06-10 MED ORDER — STERILE WATER FOR IRRIGATION IR SOLN
Status: DC | PRN
  Administered 2015-06-10: 14:00:00

## 2015-06-10 MED ORDER — SODIUM CHLORIDE 0.9 % IV SOLN
INTRAVENOUS | Status: DC
  Administered 2015-06-10: 13:00:00 via INTRAVENOUS

## 2015-06-10 MED ORDER — MIDAZOLAM HCL 5 MG/5ML IJ SOLN
INTRAMUSCULAR | Status: DC | PRN
Start: 2015-06-10 — End: 2015-06-10
  Administered 2015-06-10 (×4): 2 mg via INTRAVENOUS

## 2015-06-10 MED ORDER — BUTAMBEN-TETRACAINE-BENZOCAINE 2-2-14 % EX AERO
INHALATION_SPRAY | CUTANEOUS | Status: DC | PRN
  Administered 2015-06-10: 2 via TOPICAL

## 2015-06-10 MED ORDER — FENTANYL CITRATE (PF) 100 MCG/2ML IJ SOLN
INTRAMUSCULAR | Status: DC | PRN
  Administered 2015-06-10 (×3): 25 ug via INTRAVENOUS

## 2015-06-10 MED ORDER — MEPERIDINE HCL 50 MG/ML IJ SOLN
INTRAMUSCULAR | Status: AC
  Filled 2015-06-10: qty 1

## 2015-06-10 MED FILL — Nitroglycerin IV Soln 100 MCG/ML in D5W: INTRA_ARTERIAL | Qty: 10 | Status: AC

## 2015-06-10 NOTE — H&P (Signed)
Jenna Hernandez is an 64 y.o. female.   Chief Complaint: Patient is here for EGD. HPI: Patient 64 year old Caucasian female was been experiencing) epigastric pain for about 10 months. She has not responded to PPI therapy. Heartburns well controlled with PPI therapy. She was also tried on dicyclomine she did not help. Abdominopelvic CT did not reveal any cause of her pain. She was given prescription for sucralfate but elected not to try this medication because of potential drug interaction. 2 days ago she was admitted to [facility] prolonged chest pain radiating to her neck and jaw. She underwent cardiac cath yesterday revealing insignificant disease. She had non-STEMI back in March 2013. He denies melena or rectal bleeding. She also denies anorexia weight loss. She is not having any chest pain at the present time. She is wondering why her troponin level was elevated.  Past Medical History  Diagnosis Date  . GERD (gastroesophageal reflux disease)   . Fibromyalgia   . NSTEMI (non-ST elevated myocardial infarction)     a. NSTEMI 12/2011 with cath showing smooth/normal cors, ? MI secondary to possible transient spasm with thrombosis since she did have some hypokinesis in the mid inferior wall the overall ejection fraction was 65%, (? versus small branch occlusion off the RCA). b. Nuc 11/2013 - normal.  . Anxiety   . Jaw pain     Has been told she has bone loss by dentist.  . Hypertension   . H/O hiatal hernia   . Chronic back pain     "from the neck down" (06/08/2015)  . Diverticulosis   . Family history of adverse reaction to anesthesia     "Mom had liver ablation 04/2015; she had a hard time waking up; heartrate dropped real low; had to put her in ICU"  . Hypercholesterolemia   . Osteoarthritis     Past Surgical History  Procedure Laterality Date  . Vaginal hysterectomy  1990  . Tubal ligation  1979  . Cystectomy  ?1990's    "from my back"  . Cardiac catheterization  12/2011  . Left heart  catheterization with coronary angiogram N/A 12/13/2011    Procedure: LEFT HEART CATHETERIZATION WITH CORONARY ANGIOGRAM;  Surgeon: Thayer Headings, MD;  Location: [facility] CATH LAB;  Service: Cardiovascular;  Laterality: N/A;  . Colonoscopy  08/2013    [facility] /Dr.Benson  . Upper gastrointestinal endoscopy  09/2012    [facility]/Dr.Benson  . Laparoscopic cholecystectomy  ~ 2004  . Inguinal hernia repair Right ~ 1983  . Cardiac catheterization N/A 06/09/2015    Procedure: Left Heart Cath and Coronary Angiography;  Surgeon: Jettie Booze, MD;  Location: [place] CV LAB;  Service: Cardiovascular;  Laterality: N/A;    Family History  Problem Relation Age of Onset  . Coronary artery disease Father 61  . Heart attack Father 39  . Diabetes Father   . Irregular heart beat Mother   . Bladder Cancer Mother   . Colon cancer Mother   . Fibromyalgia Mother   . Osteoarthritis Mother   . Osteoporosis Mother   . Diabetes Sister    Social History:  reports that she quit smoking about 3 years ago. Her smoking use included Cigarettes. She has a 11.5 pack-year smoking history. She has never used smokeless tobacco. She reports that she does not drink alcohol or use illicit drugs.  Allergies:  Allergies  Allergen Reactions  . Diclofenac Sodium Anaphylaxis    Tolerates aspirin - was told not to take antiinflammatory meds.  Marland Kitchen  Meperidine-Promethazine     headache  . Ramipril     cough  . Raspberry Hives  . Codeine Rash  . Prednisone Palpitations  . Sulfa Antibiotics Rash    Medications Prior to Admission  Medication Sig Dispense Refill  . amLODipine (NORVASC) 5 MG tablet TAKE ONE TABLET BY MOUTH ONCE DAILY 30 tablet 9  . aspirin EC 81 MG tablet Take 81 mg by mouth at bedtime.    . Cholecalciferol (VITAMIN D) 2000 UNITS tablet Take 2,000 Units by mouth daily.    . diazepam (VALIUM) 2 MG tablet Take 1 mg by mouth at bedtime.     . dicyclomine (BENTYL) 10 MG capsule Take 10 mg by mouth up to 3 times a day as  needed for pain 90 capsule 1  . docusate sodium (COLACE) 100 MG capsule Take 100 mg by mouth daily.    . isosorbide mononitrate (IMDUR) 30 MG 24 hr tablet TAKE ONE TABLET BY MOUTH ONCE DAILY 30 tablet 11  . losartan (COZAAR) 25 MG tablet Take 1 tablet (25 mg total) by mouth daily. 30 tablet 11  . metoprolol tartrate (LOPRESSOR) 25 MG tablet Take 0.5 tablets (12.5 mg total) by mouth 2 (two) times daily. 30 tablet 11  . nitroGLYCERIN (NITROSTAT) 0.4 MG SL tablet Place 1 tablet (0.4 mg total) under the tongue every 5 (five) minutes as needed for chest pain (up to 3 doses). 25 tablet 3  . pantoprazole (PROTONIX) 40 MG tablet Take 40 mg by mouth 2 (two) times daily before a meal.    . pravastatin (PRAVACHOL) 20 MG tablet TAKE ONE TABLET BY MOUTH ONCE DAILY 30 tablet 11  . ranitidine (ZANTAC) 150 MG tablet Take 1 tablet (150 mg total) by mouth at bedtime.    . Wheat Dextrin (BENEFIBER PO) Take by mouth daily.     Marland Kitchen acetaminophen (TYLENOL) 500 MG tablet Take 1,000 mg by mouth every 6 (six) hours as needed (pain).      Results for orders placed or performed during the hospital encounter of 06/08/15 (from the past 48 hour(s))  MRSA PCR Screening     Status: None   Collection Time: 06/08/15  5:46 PM  Result Value Ref Range   MRSA by PCR NEGATIVE NEGATIVE    Comment:        The GeneXpert MRSA Assay (FDA approved for NASAL specimens only), is one component of a comprehensive MRSA colonization surveillance program. It is not intended to diagnose MRSA infection nor to guide or monitor treatment for MRSA infections.   Troponin I     Status: None   Collection Time: 06/08/15  7:47 PM  Result Value Ref Range   Troponin I <0.03 <0.031 ng/mL    Comment:        NO INDICATION OF MYOCARDIAL INJURY.   Heparin level (unfractionated)     Status: Abnormal   Collection Time: 06/08/15 11:21 PM  Result Value Ref Range   Heparin Unfractionated 1.10 (H) 0.30 - 0.70 IU/mL    Comment: RESULTS CONFIRMED BY  MANUAL DILUTION        IF HEPARIN RESULTS ARE BELOW EXPECTED VALUES, AND PATIENT DOSAGE HAS BEEN CONFIRMED, SUGGEST FOLLOW UP TESTING OF ANTITHROMBIN III LEVELS.   Troponin I     Status: Abnormal   Collection Time: 06/09/15  1:05 AM  Result Value Ref Range   Troponin I 0.25 (H) <0.031 ng/mL    Comment:        PERSISTENTLY INCREASED TROPONIN VALUES IN THE  RANGE OF 0.04-0.49 ng/mL CAN BE SEEN IN:       -UNSTABLE ANGINA       -CONGESTIVE HEART FAILURE       -MYOCARDITIS       -CHEST TRAUMA       -ARRYHTHMIAS       -LATE PRESENTING MYOCARDIAL INFARCTION       -COPD   CLINICAL FOLLOW-UP RECOMMENDED.   CBC     Status: Abnormal   Collection Time: 06/09/15  1:05 AM  Result Value Ref Range   WBC 6.9 4.0 - 10.5 K/uL   RBC 3.81 (L) 3.87 - 5.11 MIL/uL   Hemoglobin 11.5 (L) 12.0 - 15.0 g/dL   HCT 35.4 (L) 36.0 - 46.0 %   MCV 92.9 78.0 - 100.0 fL   MCH 30.2 26.0 - 34.0 pg   MCHC 32.5 30.0 - 36.0 g/dL   RDW 12.7 11.5 - 15.5 %   Platelets 213 150 - 400 K/uL  Lipid panel     Status: None   Collection Time: 06/09/15  1:05 AM  Result Value Ref Range   Cholesterol 163 0 - 200 mg/dL   Triglycerides 62 <150 mg/dL   HDL 66 >40 mg/dL   Total CHOL/HDL Ratio 2.5 RATIO   VLDL 12 0 - 40 mg/dL   LDL Cholesterol 85 0 - 99 mg/dL    Comment:        Total Cholesterol/HDL:CHD Risk Coronary Heart Disease Risk Table                     Men   Women  1/2 Average Risk   3.4   3.3  Average Risk       5.0   4.4  2 X Average Risk   9.6   7.1  3 X Average Risk  23.4   11.0        Use the calculated Patient Ratio above and the CHD Risk Table to determine the patient's CHD Risk.        ATP III CLASSIFICATION (LDL):  <100     mg/dL   Optimal  100-129  mg/dL   Near or Above                    Optimal  130-159  mg/dL   Borderline  160-189  mg/dL   High  >190     mg/dL   Very High   Troponin I     Status: Abnormal   Collection Time: 06/09/15  8:15 AM  Result Value Ref Range   Troponin I 0.12 (H)  <0.031 ng/mL    Comment:        PERSISTENTLY INCREASED TROPONIN VALUES IN THE RANGE OF 0.04-0.49 ng/mL CAN BE SEEN IN:       -UNSTABLE ANGINA       -CONGESTIVE HEART FAILURE       -MYOCARDITIS       -CHEST TRAUMA       -ARRYHTHMIAS       -LATE PRESENTING MYOCARDIAL INFARCTION       -COPD   CLINICAL FOLLOW-UP RECOMMENDED.   Heparin level (unfractionated)     Status: None   Collection Time: 06/09/15  8:15 AM  Result Value Ref Range   Heparin Unfractionated 0.70 0.30 - 0.70 IU/mL    Comment:        IF HEPARIN RESULTS ARE BELOW EXPECTED VALUES, AND PATIENT DOSAGE HAS BEEN CONFIRMED, SUGGEST FOLLOW UP TESTING OF ANTITHROMBIN III  LEVELS.   Protime-INR     Status: None   Collection Time: 06/09/15  8:15 AM  Result Value Ref Range   Prothrombin Time 13.4 11.6 - 15.2 seconds   INR 1.00 0.00 - 1.49  I-STAT, chem 8     Status: Abnormal   Collection Time: 06/09/15 11:07 AM  Result Value Ref Range   Sodium 141 135 - 145 mmol/L   Potassium 3.9 3.5 - 5.1 mmol/L   Chloride 106 101 - 111 mmol/L   BUN 15 6 - 20 mg/dL   Creatinine, Ser 0.80 0.44 - 1.00 mg/dL   Glucose, Bld 121 (H) 65 - 99 mg/dL   Calcium, Ion 1.25 1.13 - 1.30 mmol/L   TCO2 25 0 - 100 mmol/L   Hemoglobin 12.2 12.0 - 15.0 g/dL   HCT 36.0 36.0 - 18.2 %  Basic metabolic panel     Status: Abnormal   Collection Time: 06/09/15  1:38 PM  Result Value Ref Range   Sodium 141 135 - 145 mmol/L   Potassium 4.2 3.5 - 5.1 mmol/L   Chloride 108 101 - 111 mmol/L   CO2 28 22 - 32 mmol/L   Glucose, Bld 106 (H) 65 - 99 mg/dL   BUN 12 6 - 20 mg/dL   Creatinine, Ser 0.81 0.44 - 1.00 mg/dL   Calcium 9.2 8.9 - 10.3 mg/dL   GFR calc non Af Amer >60 >60 mL/min   GFR calc Af Amer >60 >60 mL/min    Comment: (NOTE) The eGFR has been calculated using the CKD EPI equation. This calculation has not been validated in all clinical situations. eGFR's persistently <60 mL/min signify possible Chronic Kidney Disease.    Anion gap 5 5 - 15   No  results found.  ROS  Blood pressure 116/76, pulse 61, temperature 98.8 F (37.1 C), temperature source Oral, resp. rate 16, SpO2 98 %. Physical Exam  Constitutional: She appears well-developed and well-nourished.  HENT:  Mouth/Throat: Oropharynx is clear and moist.  Eyes: Conjunctivae are normal. No scleral icterus.  Neck: No thyromegaly present.  Cardiovascular: Normal rate, regular rhythm and normal heart sounds.   No murmur heard. Respiratory: Effort normal and breath sounds normal.  Tender sternal wall  GI: Soft. She exhibits no distension and no mass. There is no tenderness.  Musculoskeletal: She exhibits no edema.  Lymphadenopathy:    She has no cervical adenopathy.  Neurological: She is alert.  Skin: Skin is warm and dry.     Assessment/Plan Intermittent epigastric pain of 10 months duration unresponsive to therapy. Diagnostic EGD.  Aela Bohan U 06/10/2015, 1:55 PM

## 2015-06-10 NOTE — Op Note (Signed)
EGD PROCEDURE REPORT  PATIENT:  Jenna Hernandez  MR#:  [id number] Birthdate:  1951/01/27, 64 y.o., female Endoscopist:  Dr. Rogene Houston, MD Referred By:  Dr. Manon Hilding, MD  Procedure Date: 06/10/2015  Procedure:   EGD  Indications:  Patient is 64 year old Caucasian female whose been experiencing intermittent epigastric pain for about 10 months. She has chronic GERD and heartburns well controlled with PPI. LFTs have been normal. Abdominopelvic CT was unremarkable. She has not responded to dicyclomine. She was admitted to [facility] 2 days ago for prolonged chest pain and mildly elevated troponin levels. He underwent cardiac cath yesterday and her pain was felt to be noncardiac. She is pain-free today.            Informed Consent:  The risks, benefits, alternatives & imponderables which include, but are not limited to, bleeding, infection, perforation, drug reaction and potential missed lesion have been reviewed.  The potential for biopsy, lesion removal, esophageal dilation, etc. have also been discussed.  Questions have been answered.  All parties agreeable.  Please see history & physical in medical record for more information.  Medications:  Fentanyl 75 g IV Versed 8 mg IV Cetacaine spray topically for oropharyngeal anesthesia  Description of procedure:  The endoscope was introduced through the mouth and advanced to the second portion of the duodenum without difficulty or limitations. The mucosal surfaces were surveyed very carefully during advancement of the scope and upon withdrawal.  Findings:  Esophagus:  Mucosa of the esophagus was normal. GE junction was unremarkable. GEJ:  35 cm Hiatus:  37 cm Stomach:  Stomach was empty and distended very well with insufflation. Folds in the proximal stomach were normal. Examination of mucosa at gastric body was normal. Patchy erythema and edema noted to antral mucosa no erosions or ulcers noted. Pyloric channel was patent. Angularis fundus and  cardia were unremarkable. Duodenum:  Bulbar and post bulbar mucosa.  Therapeutic/Diagnostic Maneuvers Performed:   Antral biopsy taken for routine histology.  Complications:  None  Impression: Small sliding hiatal hernia without evidence of erosive esophagitis. Nonerosive antral gastritis. Antral biopsy taken for routine histology Evidence of peptic ulcer disease  Recommendations:  Standard instructions given. I will be contacting patient with biopsy results and further recommendations.  Esta Carmon U  06/10/2015  2:33 PM  CC: Dr. Manon Hilding, MD & Dr. Rayne Du ref. provider found

## 2015-06-10 NOTE — Discharge Instructions (Signed)
Resume usual medications and diet. No driving for 24 hours. Physician will call with biopsy results next week.  Esophagogastroduodenoscopy Care After Refer to this sheet in the next few weeks. These instructions provide you with information on caring for yourself after your procedure. Your caregiver may also give you more specific instructions. Your treatment has been planned according to current medical practices, but problems sometimes occur. Call your caregiver if you have any problems or questions after your procedure.  HOME CARE INSTRUCTIONS  Do not eat or drink anything until the numbing medicine (local anesthetic) has worn off and your gag reflex has returned. You will know that the local anesthetic has worn off when you can swallow comfortably.  Do not drive for 12 hours after the procedure or as directed by your caregiver.  Only take medicines as directed by your caregiver. SEEK MEDICAL CARE IF:   You cannot stop coughing.  You are not urinating at all or less than usual. SEEK IMMEDIATE MEDICAL CARE IF:  You have difficulty swallowing.  You cannot eat or drink.  You have worsening throat or chest pain.  You have dizziness, lightheadedness, or you faint.  You have nausea or vomiting.  You have chills.  You have a fever.  You have severe abdominal pain.  You have black, tarry, or bloody stools. Document Released: 09/10/2012 Document Reviewed: 09/10/2012 [facility] Patient Information 2015 [place]. This information is not intended to replace advice given to you by your health care provider. Make sure you discuss any questions you have with your health care provider.

## 2015-06-14 ENCOUNTER — Telehealth (INDEPENDENT_AMBULATORY_CARE_PROVIDER_SITE_OTHER): Payer: Self-pay | Admitting: *Deleted

## 2015-06-14 NOTE — Telephone Encounter (Signed)
Patient states you mentioned something about a chest CT or xray, do I need to scheduled -- please advise

## 2015-06-15 ENCOUNTER — Encounter (HOSPITAL_COMMUNITY): Payer: Self-pay | Admitting: Internal Medicine

## 2015-06-20 DIAGNOSIS — T63441A Toxic effect of venom of bees, accidental (unintentional), initial encounter: Secondary | ICD-10-CM | POA: Diagnosis not present

## 2015-06-21 NOTE — Telephone Encounter (Signed)
Chest CT sch'd 06/23/15 at 700 am (630) @ [place], patient aware

## 2015-06-21 NOTE — Telephone Encounter (Signed)
Will proceed with Chest CT with contrast, patient still complain of chest pain

## 2015-06-24 DIAGNOSIS — I10 Essential (primary) hypertension: Secondary | ICD-10-CM | POA: Diagnosis not present

## 2015-06-24 DIAGNOSIS — R079 Chest pain, unspecified: Secondary | ICD-10-CM | POA: Diagnosis not present

## 2015-06-24 DIAGNOSIS — R918 Other nonspecific abnormal finding of lung field: Secondary | ICD-10-CM | POA: Diagnosis not present

## 2015-07-07 ENCOUNTER — Telehealth (INDEPENDENT_AMBULATORY_CARE_PROVIDER_SITE_OTHER): Payer: Self-pay | Admitting: *Deleted

## 2015-07-07 NOTE — Telephone Encounter (Signed)
Jenna Hernandez would like to get her CT results, done at [facility], and speak with Dr. Laural Golden also. Her return phone number is [phone].

## 2015-07-07 NOTE — Telephone Encounter (Signed)
Forwarded to [place] for review. Patient was recently in hospital at [facility], it was thought she was having a heart attack.

## 2015-07-08 NOTE — Telephone Encounter (Signed)
Patient's call returned. Chest CT shows 3 small nodules measuring 2-4 mm. No abnormality noted to account for chest pain. She will need follow-up study in 6 months

## 2015-07-21 ENCOUNTER — Ambulatory Visit: Payer: Self-pay | Admitting: Cardiology

## 2015-07-29 ENCOUNTER — Other Ambulatory Visit: Payer: Self-pay | Admitting: Cardiology

## 2015-07-29 ENCOUNTER — Other Ambulatory Visit: Payer: Self-pay

## 2015-07-29 ENCOUNTER — Encounter (INDEPENDENT_AMBULATORY_CARE_PROVIDER_SITE_OTHER): Payer: Self-pay

## 2015-07-29 MED ORDER — ISOSORBIDE MONONITRATE ER 30 MG PO TB24: 30.0000 mg | ORAL_TABLET | Freq: Every day | ORAL | Status: DC

## 2015-07-29 MED ORDER — LOSARTAN POTASSIUM 25 MG PO TABS: 25.0000 mg | ORAL_TABLET | Freq: Every day | ORAL | Status: DC

## 2015-08-03 ENCOUNTER — Ambulatory Visit (INDEPENDENT_AMBULATORY_CARE_PROVIDER_SITE_OTHER): Payer: PRIVATE HEALTH INSURANCE | Admitting: Physician Assistant

## 2015-08-03 ENCOUNTER — Ambulatory Visit: Payer: Self-pay | Admitting: Cardiology

## 2015-08-03 ENCOUNTER — Encounter: Payer: Self-pay | Admitting: Physician Assistant

## 2015-08-03 VITALS — BP 106/60 | HR 61 | Ht 63.0 in | Wt 175.0 lb

## 2015-08-03 DIAGNOSIS — K219 Gastro-esophageal reflux disease without esophagitis: Secondary | ICD-10-CM

## 2015-08-03 DIAGNOSIS — I10 Essential (primary) hypertension: Secondary | ICD-10-CM

## 2015-08-03 DIAGNOSIS — I2510 Atherosclerotic heart disease of native coronary artery without angina pectoris: Secondary | ICD-10-CM

## 2015-08-03 DIAGNOSIS — R918 Other nonspecific abnormal finding of lung field: Secondary | ICD-10-CM | POA: Insufficient documentation

## 2015-08-03 DIAGNOSIS — I201 Angina pectoris with documented spasm: Secondary | ICD-10-CM

## 2015-08-03 DIAGNOSIS — E785 Hyperlipidemia, unspecified: Secondary | ICD-10-CM | POA: Diagnosis not present

## 2015-08-03 DIAGNOSIS — E669 Obesity, unspecified: Secondary | ICD-10-CM | POA: Insufficient documentation

## 2015-08-03 MED ORDER — LOSARTAN POTASSIUM 25 MG PO TABS: 25.0000 mg | ORAL_TABLET | Freq: Every day | ORAL | Status: DC

## 2015-08-03 MED ORDER — ISOSORBIDE MONONITRATE ER 30 MG PO TB24: 30.0000 mg | ORAL_TABLET | Freq: Every day | ORAL | Status: DC

## 2015-08-03 MED ORDER — PRAVASTATIN SODIUM 20 MG PO TABS: ORAL_TABLET | ORAL | Status: DC

## 2015-08-03 NOTE — Patient Instructions (Signed)
Medication Instructions:  None  Labwork: None  Testing/Procedures: None  Follow-Up: Your physician wants you to follow-up in: 6 months with Dr. Martinique.  You will receive a reminder letter in the mail two months in advance. If you don't receive a letter, please call our office to schedule the follow-up appointment.   Any Other Special Instructions Will Be Listed Below (If Applicable).  Try holding your Metoprolol for one week.  Call our office in one week with an update on how you are feeling.    If you need a refill on your cardiac medications before your next appointment, please call your pharmacy.

## 2015-08-03 NOTE — Progress Notes (Signed)
Cardiology Office Note Date:  08/03/2015  Patient ID:  [name], [name] June 19, 1951, MRN [id number] PCP:  Manon Hilding, MD  Cardiologist:  Dr. Mare Ferrari  Chief Complaint: f/u cath  History of Present Illness: Jenna Hernandez is a 64 y.o. female with history of coronary vasospasm/CAD, obesity, HTN, HLD, fibromyalgia, GERD, former tobacco abuse, recently diagnosed PUD who presents for post-cath follow-up.   Per chart review, she has history of NSTEMI on 12/13/2011 and underwent cath showing smooth and normal coronaries. It was felt her MI was 2/2 possible transient spasm with thrombosis since she did have some hypokinesis in the mid inferior wall - the overall ejection fraction was 65%. She was readmitted 12/26/11 with chest pain and discharged the following day after ruling out. During her readmission, her cath films were reviewed by Dr. Fletcher Anon who felt she may have had a small branch occlusion of off the right coronary artery to explain her wall motion abnormalities. She was readmitted in 07/2013 for chest pain and ruled out; amlodipine was increased to treat possible spasm. She was admitted in 2015 for chest pain and nuc was normal.  Most recently she was admitted in September 2016 for chest pain and jaw pain with positive troponins (0.25->0.12). She underwent cardiac cath 06/09/15 showing no significant CAD, LVEF 55-65%. She was discharged home on prior regimen. She underwent previously scheduled EGD 06/10/15 showing small sliidng hiatal hernia, nonerosive antral gastritis, and evidence of peptic ulcer disease. GI felt she was having chemical gastritis possibly due to bile reflux. She underwent CT abd/pelvis per GI showing pulmonary nodules up to 38mm; small hiatal hernia, and GE junction fluid level suggested dysmotility or GERD. Last labs showed Hgb 11.5, LDL 85, Cr 0.81.  She comes in today for follow-up overall doing well. She has not had recurrence of chest pain like what prompted her recent  admission. She does note continued DOE but says she is not sure if this is due to deconditioning. No syncope, presyncope or bleeding noted. Cath site healed well.   Past Medical History  Diagnosis Date  . GERD (gastroesophageal reflux disease)   . Fibromyalgia   . Anxiety   . Jaw pain     Has been told she has bone loss by dentist.  . Essential hypertension   . H/O hiatal hernia   . Chronic back pain     "from the neck down" (06/08/2015)  . Diverticulosis   . Family history of adverse reaction to anesthesia     "Mom had liver ablation 04/2015; she had a hard time waking up; heartrate dropped real low; had to put her in ICU"  . Hypercholesterolemia   . Osteoarthritis   . CAD (coronary artery disease)     a. NSTEMI 12/2014 - cath smooth/normal cors, ? vasospasm with thrombosis given HK in inferior wall - readmitted same month - MD reviewed films and felt she may have had small branch occlusion off RCA accounting for WMA. b. Subsequent workups in 2014, 2015 normal. c. Readmitted 06/2015 with CP/+ troponin - cath showed no significant CAD, LVEF 55-65%.  . Coronary vasospasm ([place])   . Obesity   . Former tobacco use   . PUD (peptic ulcer disease)     a. EGD 06/2015 suggestive of this per note.  . Antral gastritis     a. EGD 06/2015 suggestive of this per note.  . Pulmonary nodules     a. Incidentally noted on CT by GI 06/2015, f/u 1 yr recommended.  Past Surgical History  Procedure Laterality Date  . Vaginal hysterectomy  1990  . Tubal ligation  1979  . Cystectomy  ?1990's    "from my back"  . Cardiac catheterization  12/2011  . Left heart catheterization with coronary angiogram N/A 12/13/2011    Procedure: LEFT HEART CATHETERIZATION WITH CORONARY ANGIOGRAM;  Surgeon: Thayer Headings, MD;  Location: [facility] CATH LAB;  Service: Cardiovascular;  Laterality: N/A;  . Colonoscopy  08/2013    [facility] /Dr.Benson  . Upper gastrointestinal endoscopy  09/2012    [facility]/Dr.Benson  . Laparoscopic  cholecystectomy  ~ 2004  . Inguinal hernia repair Right ~ 1983  . Cardiac catheterization N/A 06/09/2015    Procedure: Left Heart Cath and Coronary Angiography;  Surgeon: Jettie Booze, MD;  Location: [place] CV LAB;  Service: Cardiovascular;  Laterality: N/A;  . Esophagogastroduodenoscopy N/A 06/10/2015    Procedure: ESOPHAGOGASTRODUODENOSCOPY (EGD);  Surgeon: Rogene Houston, MD;  [facility];  Service: Endoscopy;  Laterality: N/A;  140    Current Outpatient Prescriptions  Medication Sig Dispense Refill  . acetaminophen (TYLENOL) 500 MG tablet Take 1,000 mg by mouth every 6 (six) hours as needed (pain).    Marland Kitchen amLODipine (NORVASC) 5 MG tablet TAKE ONE TABLET BY MOUTH ONCE DAILY 30 tablet 9  . aspirin EC 81 MG tablet Take 81 mg by mouth at bedtime.    . diazepam (VALIUM) 2 MG tablet Take 1 mg by mouth at bedtime.     . isosorbide mononitrate (IMDUR) 30 MG 24 hr tablet Take 1 tablet (30 mg total) by mouth daily. 30 tablet 1  . losartan (COZAAR) 25 MG tablet Take 1 tablet (25 mg total) by mouth daily. 30 tablet 1  . metoprolol tartrate (LOPRESSOR) 25 MG tablet Take 0.5 tablets (12.5 mg total) by mouth 2 (two) times daily. 30 tablet 11  . nitroGLYCERIN (NITROSTAT) 0.4 MG SL tablet Place 1 tablet (0.4 mg total) under the tongue every 5 (five) minutes as needed for chest pain (up to 3 doses). 25 tablet 3  . pantoprazole (PROTONIX) 40 MG tablet Take 40 mg by mouth 2 (two) times daily before a meal.    . pravastatin (PRAVACHOL) 20 MG tablet TAKE ONE TABLET BY MOUTH ONCE DAILY 30 tablet 11  . ranitidine (ZANTAC) 150 MG tablet Take 1 tablet (150 mg total) by mouth at bedtime.    . Cholecalciferol (VITAMIN D) 2000 UNITS tablet Take 2,000 Units by mouth daily.    Marland Kitchen dicyclomine (BENTYL) 10 MG capsule Take 10 mg by mouth up to 3 times a day as needed for pain (Patient not taking: Reported on 08/03/2015) 90 capsule 1  . docusate sodium (COLACE) 100 MG capsule Take 100 mg by mouth daily.      . Wheat Dextrin (BENEFIBER PO) Take by mouth daily.      No current facility-administered medications for this visit.    Allergies:   Diclofenac sodium; Meperidine-promethazine; Ramipril; Raspberry; Codeine; Prednisone; and Sulfa antibiotics   Social History:  The patient  reports that she quit smoking about 3 years ago. Her smoking use included Cigarettes. She has a 11.5 pack-year smoking history. She has never used smokeless tobacco. She reports that she does not drink alcohol or use illicit drugs.   Family History:  The patient's family history includes Bladder Cancer in her mother; Colon cancer in her mother; Coronary artery disease (age of onset: 26) in her father; Diabetes in her father and sister; Fibromyalgia in her mother;  Heart attack (age of onset: 14) in her father; Irregular heart beat in her mother; Osteoarthritis in her mother; Osteoporosis in her mother.  ROS:  Please see the history of present illness.  All other systems are reviewed and otherwise negative.   PHYSICAL EXAM:  VS:  BP 106/60 mmHg  Pulse 61  Ht 5\' 3"  (1.6 m)  Wt 175 lb (79.379 kg)  BMI 31.01 kg/m2  SpO2 96% BMI: Body mass index is 31.01 kg/(m^2). Well nourished, well developed WF, in no acute distress HEENT: normocephalic, atraumatic Neck: no JVD, carotid bruits or masses Cardiac:  normal S1, S2; RRR; no murmurs, rubs, or gallops Lungs:  clear to auscultation bilaterally, no wheezing, rhonchi or rales Abd: soft, nontender, no hepatomegaly, + BS MS: no deformity or atrophy Ext: no edema, right radial cath site well healed without hematoma or ecchymosis Skin: warm and dry, no rash Neuro:  moves all extremities spontaneously, no focal abnormalities noted, follows commands Psych: euthymic mood, full affect  EKG:  Done today shows NSR 61bpm, cannot rule out inferior infarct age undetermined, TWI lead III, nonspecific T wave change in V2 - no sig change from recent tracing  Recent Labs: 06/09/2015: BUN 12;  Creatinine, Ser 0.81; Hemoglobin 12.2; Platelets 213; Potassium 4.2; Sodium 141  06/09/2015: Cholesterol 163; HDL 66; LDL Cholesterol 85; Total CHOL/HDL Ratio 2.5; Triglycerides 62; VLDL 12   CrCl cannot be calculated (Patient has no serum creatinine result on file.).   Wt Readings from Last 3 Encounters:  08/03/15 175 lb (79.379 kg)  06/09/15 173 lb 11.2 oz (78.79 kg)  05/16/15 174 lb 4.8 oz (79.062 kg)     Other studies reviewed: Additional studies/records reviewed today include: summarized above  ASSESSMENT AND PLAN:  1. CAD due to h/o coronary vasospasm - continue amlodipine and isosorbide. She has not had any further episodes of CP. She does still report exertional dyspnea but is not sure if this is due to deconditioning. Her HR and BP tend to run on the lower side recently. I offered for her to stop metoprolol and see how she feels in case there is a component of chronotropic incompetence. This will also allow [place] to increase her anti-spasm meds in the future if she has recurrence of spasm (softer BP currently prohibits aggressive titration). I asked her to call us in a week with a update of how she is feeling, and also encouraged regular physical activity. 2. Essential HTN - follow BP with med change as above.  3. Hyperlipidemia - continue statin. Recent LDL was 85 (good level, in absence of CAD on cath). 4. GERD - followed by GI. 5. Pulmonary nodules - the patient is aware of this finding and plans to f/u with PCP for repeat CT scan in 6 months.  Disposition: F/u with Dr. Martinique in 6 months (upon Dr. Sherryl Barters retirement - rest of family sees Dr. Martinique.)  Current medicines are reviewed at length with the patient today.  The patient did not have any concerns regarding medicines.  Raechel Ache PA-C 08/03/2015 3:01 PM     [place] [place] [address] [phone] (office)  [phone] (fax)

## 2015-08-08 DIAGNOSIS — I10 Essential (primary) hypertension: Secondary | ICD-10-CM | POA: Diagnosis not present

## 2015-08-08 DIAGNOSIS — M797 Fibromyalgia: Secondary | ICD-10-CM | POA: Diagnosis not present

## 2015-08-08 DIAGNOSIS — K5900 Constipation, unspecified: Secondary | ICD-10-CM | POA: Diagnosis not present

## 2015-08-08 DIAGNOSIS — R1013 Epigastric pain: Secondary | ICD-10-CM | POA: Diagnosis not present

## 2015-08-08 DIAGNOSIS — E782 Mixed hyperlipidemia: Secondary | ICD-10-CM | POA: Diagnosis not present

## 2015-08-08 DIAGNOSIS — F411 Generalized anxiety disorder: Secondary | ICD-10-CM | POA: Diagnosis not present

## 2015-08-08 DIAGNOSIS — R739 Hyperglycemia, unspecified: Secondary | ICD-10-CM | POA: Diagnosis not present

## 2015-08-16 ENCOUNTER — Telehealth: Payer: Self-pay | Admitting: Cardiology

## 2015-08-16 NOTE — Telephone Encounter (Signed)
New Message  Pt wanted to inform office of BP readings and speak w/ RN about what to do going forward. Pt stated she can not answer phone after 5- 630. Please call back and discuss.  10/29- 115/81 p 92  10/30- 111/76 p 112 1031- 133/83 p 79 11/2- 129/79 p 72 11/3- 121/79 p 80,, 139/75 p 73 11/4- 137/80 p 70 11/5- 127/80 p 81,, 135/80 p 82 11/6- 109/67 p 87 11/7- 127/68 p 86

## 2015-08-16 NOTE — Telephone Encounter (Signed)
Pt wanted to know if she needs to continue not taking her Metoprolol.  Told her BP's would be shared with Dr. Mare Ferrari and he or his nurse will call you with any changes, but to continue doing as instructed as BP looks good. Pt agrees with plan no questions at this time.

## 2015-08-16 NOTE — Telephone Encounter (Signed)
Continue current meds 

## 2015-08-17 NOTE — Telephone Encounter (Signed)
Advised patient

## 2015-12-05 DIAGNOSIS — R739 Hyperglycemia, unspecified: Secondary | ICD-10-CM | POA: Diagnosis not present

## 2015-12-05 DIAGNOSIS — I10 Essential (primary) hypertension: Secondary | ICD-10-CM | POA: Diagnosis not present

## 2015-12-05 DIAGNOSIS — E782 Mixed hyperlipidemia: Secondary | ICD-10-CM | POA: Diagnosis not present

## 2015-12-07 DIAGNOSIS — E782 Mixed hyperlipidemia: Secondary | ICD-10-CM | POA: Diagnosis not present

## 2015-12-07 DIAGNOSIS — R739 Hyperglycemia, unspecified: Secondary | ICD-10-CM | POA: Diagnosis not present

## 2015-12-07 DIAGNOSIS — R1013 Epigastric pain: Secondary | ICD-10-CM | POA: Diagnosis not present

## 2015-12-07 DIAGNOSIS — K5900 Constipation, unspecified: Secondary | ICD-10-CM | POA: Diagnosis not present

## 2015-12-07 DIAGNOSIS — M797 Fibromyalgia: Secondary | ICD-10-CM | POA: Diagnosis not present

## 2015-12-07 DIAGNOSIS — F411 Generalized anxiety disorder: Secondary | ICD-10-CM | POA: Diagnosis not present

## 2015-12-07 DIAGNOSIS — I10 Essential (primary) hypertension: Secondary | ICD-10-CM | POA: Diagnosis not present

## 2015-12-29 ENCOUNTER — Other Ambulatory Visit: Payer: Self-pay | Admitting: Cardiology

## 2015-12-29 NOTE — Telephone Encounter (Signed)
REFILL 

## 2016-03-07 ENCOUNTER — Other Ambulatory Visit: Payer: Self-pay | Admitting: *Deleted

## 2016-03-07 MED ORDER — AMLODIPINE BESYLATE 5 MG PO TABS: 5.0000 mg | ORAL_TABLET | Freq: Every day | ORAL | Status: DC

## 2016-03-19 ENCOUNTER — Ambulatory Visit (INDEPENDENT_AMBULATORY_CARE_PROVIDER_SITE_OTHER): Payer: PRIVATE HEALTH INSURANCE | Admitting: Cardiology

## 2016-03-19 ENCOUNTER — Encounter: Payer: Self-pay | Admitting: Cardiology

## 2016-03-19 VITALS — BP 110/80 | HR 88 | Ht 63.0 in | Wt 177.2 lb

## 2016-03-19 DIAGNOSIS — I10 Essential (primary) hypertension: Secondary | ICD-10-CM

## 2016-03-19 DIAGNOSIS — I201 Angina pectoris with documented spasm: Secondary | ICD-10-CM

## 2016-03-19 DIAGNOSIS — M797 Fibromyalgia: Secondary | ICD-10-CM

## 2016-03-19 NOTE — Patient Instructions (Signed)
Continue your current therapy  I will see you in 6-8 months. 

## 2016-03-19 NOTE — Progress Notes (Signed)
Cardiology Office Note Date:  03/19/2016  Patient ID:  Jenna Hernandez, Jenna Hernandez August 27, 1951, MRN [id number] PCP:  Manon Hilding, MD  Cardiologist:  Dr. Martinique  Chief Complaint: f/u cath  History of Present Illness: Jenna Hernandez is a 65 y.o. female with history of coronary vasospasm, obesity, HTN, HLD, fibromyalgia, GERD, former tobacco abuse who is seen for follow up.   She has history of NSTEMI on 12/13/2011 with peak troponin of 7.7 and underwent cath showing smooth and normal coronaries. It was felt her MI was 2/2 possible transient spasm  since she did have some hypokinesis in the mid inferior wall - the overall ejection fraction was 65%. She was readmitted 12/26/11 with chest pain and discharged the following day after ruling out. In May 2013 she had a normal Dobutamine Echo. She was readmitted in 07/2013 for chest pain and ruled out; amlodipine was increased to treat possible spasm. She was admitted in 2015 for chest pain and a Myoview study was normal.  She was admitted in September 2016 for chest pain and jaw pain with positive troponins (0.25->0.12). She underwent cardiac cath 06/09/15 showing no significant CAD, LVEF 55-65%. She was discharged home on prior regimen. She underwent previously scheduled EGD 06/10/15 showing small sliidng hiatal hernia, nonerosive antral gastritis, and evidence of peptic ulcer disease. GI felt she was having chemical gastritis possibly due to bile reflux. She underwent CT abd/pelvis per GI showing pulmonary nodules up to 63mm; small hiatal hernia, and GE junction fluid level suggested dysmotility or GERD.   On follow up today she states she is doing well. She does have fibromyalgia including facial myalgia. She also has TMJ arthritis so frequently has jaw pain. She denies any chest pain or SOB. She is trying to eat healthy. She quit smoking in 2013. She works in Dentist at [employer]    Past Medical History  Diagnosis Date  . GERD (gastroesophageal  reflux disease)   . Fibromyalgia   . Anxiety   . Jaw pain     Has been told she has bone loss by dentist.  . Essential hypertension   . H/O hiatal hernia   . Chronic back pain     "from the neck down" (06/08/2015)  . Diverticulosis   . Family history of adverse reaction to anesthesia     "Mom had liver ablation 04/2015; she had a hard time waking up; heartrate dropped real low; had to put her in ICU"  . Hypercholesterolemia   . Osteoarthritis   . CAD (coronary artery disease)     a. NSTEMI 12/2014 - cath smooth/normal cors, ? vasospasm with thrombosis given HK in inferior wall - readmitted same month - MD reviewed films and felt she may have had small branch occlusion off RCA accounting for WMA. b. Subsequent workups in 2014, 2015 normal. c. Readmitted 06/2015 with CP/+ troponin - cath showed no significant CAD, LVEF 55-65%.  . Coronary vasospasm ([place])   . Obesity   . Former tobacco use   . PUD (peptic ulcer disease)     a. EGD 06/2015 suggestive of this per note.  . Antral gastritis     a. EGD 06/2015 suggestive of this per note.  . Pulmonary nodules     a. Incidentally noted on CT by GI 06/2015, f/u 1 yr recommended.    Past Surgical History  Procedure Laterality Date  . Vaginal hysterectomy  1990  . Tubal ligation  1979  . Cystectomy  ?1990's    "  from my back"  . Cardiac catheterization  12/2011  . Left heart catheterization with coronary angiogram N/A 12/13/2011    Procedure: LEFT HEART CATHETERIZATION WITH CORONARY ANGIOGRAM;  Surgeon: Thayer Headings, MD;  Location[facility] CATH LAB;  Service: Cardiovascular;  Laterality: N/A;  . Colonoscopy  08/2013    [facility] /Dr.Benson  . Upper gastrointestinal endoscopy  09/2012    [facility]/Dr.Benson  . Laparoscopic cholecystectomy  ~ 2004  . Inguinal hernia repair Right ~ 1983  . Cardiac catheterization N/A 06/09/2015    Procedure: Left Heart Cath and Coronary Angiography;  Surgeon: Jettie Booze, MD;  Location: [place] CV LAB;  Service:  Cardiovascular;  Laterality: N/A;  . Esophagogastroduodenoscopy N/A 06/10/2015    Procedure: ESOPHAGOGASTRODUODENOSCOPY (EGD);  Surgeon: Rogene Houston, MD;  [facility];  Service: Endoscopy;  Laterality: N/A;  140    Current Outpatient Prescriptions  Medication Sig Dispense Refill  . acetaminophen (TYLENOL) 500 MG tablet Take 1,000 mg by mouth every 6 (six) hours as needed (pain).    Marland Kitchen amLODipine (NORVASC) 5 MG tablet Take 1 tablet (5 mg total) by mouth daily. KEEP OV 30 tablet 0  . aspirin EC 81 MG tablet Take 81 mg by mouth at bedtime.    . Cholecalciferol (VITAMIN D) 2000 UNITS tablet Take 2,000 Units by mouth once a week.     . diazepam (VALIUM) 2 MG tablet Take 1 mg by mouth at bedtime.     . isosorbide mononitrate (IMDUR) 30 MG 24 hr tablet Take 1 tablet (30 mg total) by mouth daily. 90 tablet 3  . losartan (COZAAR) 25 MG tablet TAKE ONE TABLET BY MOUTH ONCE DAILY 30 tablet 3  . metoprolol tartrate (LOPRESSOR) 25 MG tablet Take 0.5 tablets (12.5 mg total) by mouth 2 (two) times daily. 30 tablet 11  . nitroGLYCERIN (NITROSTAT) 0.4 MG SL tablet Place 1 tablet (0.4 mg total) under the tongue every 5 (five) minutes as needed for chest pain (up to 3 doses). 25 tablet 3  . pantoprazole (PROTONIX) 40 MG tablet Take 40 mg by mouth 2 (two) times daily before a meal.    . pravastatin (PRAVACHOL) 20 MG tablet TAKE ONE TABLET BY MOUTH ONCE DAILY 90 tablet 3  . ranitidine (ZANTAC) 150 MG tablet Take 1 tablet (150 mg total) by mouth at bedtime.    . Wheat Dextrin (BENEFIBER PO) Take by mouth daily.      No current facility-administered medications for this visit.    Allergies:   Diclofenac sodium; Meperidine-promethazine; Ramipril; Raspberry; Codeine; Prednisone; and Sulfa antibiotics   Social History:  The patient  reports that she quit smoking about 4 years ago. Her smoking use included Cigarettes. She has a 11.5 pack-year smoking history. She has never used smokeless tobacco. She  reports that she does not drink alcohol or use illicit drugs.   Family History:  The patient's family history includes Bladder Cancer in her mother; Colon cancer in her mother; Coronary artery disease (age of onset: 37) in her father; Diabetes in her father and sister; Fibromyalgia in her mother; Heart attack (age of onset: 47) in her father; Irregular heart beat in her mother; Osteoarthritis in her mother; Osteoporosis in her mother.  ROS:  Please see the history of present illness.  All other systems are reviewed and otherwise negative.   PHYSICAL EXAM:  VS:  BP 110/80 mmHg  Pulse 88  Ht 5\' 3"  (1.6 m)  Wt 177 lb 3.2 oz (80.377 kg)  BMI 31.40 kg/m2  SpO2 97% BMI: Body mass index is 31.4 kg/(m^2). Well nourished, well developed WF, in no acute distress HEENT: normocephalic, atraumatic Neck: no JVD, carotid bruits or masses Cardiac:  normal S1, S2; RRR; no murmurs, rubs, or gallops Lungs:  clear to auscultation bilaterally, no wheezing, rhonchi or rales Abd: soft, nontender, no hepatomegaly, + BS MS: no deformity or atrophy Ext: no edema, right radial cath site well healed without hematoma or ecchymosis Skin: warm and dry, no rash Neuro:  moves all extremities spontaneously, no focal abnormalities noted, follows commands Psych: euthymic mood, full affect  EKG:  Not done today.  Recent Labs: 06/09/2015: BUN 12; Creatinine, Ser 0.81; Hemoglobin 12.2; Platelets 213; Potassium 4.2; Sodium 141  06/09/2015: Cholesterol 163; HDL 66; LDL Cholesterol 85; Total CHOL/HDL Ratio 2.5; Triglycerides 62; VLDL 12   CrCl cannot be calculated (Patient has no serum creatinine result on file.).   Wt Readings from Last 3 Encounters:  03/19/16 177 lb 3.2 oz (80.377 kg)  08/03/15 175 lb (79.379 kg)  06/09/15 173 lb 11.2 oz (78.79 kg)     Other studies reviewed: Additional studies/records reviewed today include: summarized above  ASSESSMENT AND PLAN:  1. History of  coronary vasospasm - this appears  to be a diagnosis of exclusion with no other explanation for her troponin elevation in the past. Continue amlodipine and isosorbide. She has not had any further episodes of CP. She does still report jaw pain but has other reasons for this.  I  encouraged regular physical activity and weight loss.. 2. Essential HTN - well controlled.   3. Hyperlipidemia - continue statin. Last LDL was 85 4. GERD - followed by GI.  Disposition: F/u  in 6 months   Current medicines are reviewed at length with the patient today.  The patient did not have any concerns regarding medicines.  Signed, Kacper Cartlidge Martinique MD, [facility]  03/19/2016 6:25 PM     [facility]

## 2016-03-27 DIAGNOSIS — M25552 Pain in left hip: Secondary | ICD-10-CM | POA: Diagnosis not present

## 2016-03-27 DIAGNOSIS — M4727 Other spondylosis with radiculopathy, lumbosacral region: Secondary | ICD-10-CM | POA: Diagnosis not present

## 2016-03-29 DIAGNOSIS — M47816 Spondylosis without myelopathy or radiculopathy, lumbar region: Secondary | ICD-10-CM | POA: Diagnosis not present

## 2016-03-29 DIAGNOSIS — M5126 Other intervertebral disc displacement, lumbar region: Secondary | ICD-10-CM | POA: Diagnosis not present

## 2016-03-29 DIAGNOSIS — M4806 Spinal stenosis, lumbar region: Secondary | ICD-10-CM | POA: Diagnosis not present

## 2016-04-03 DIAGNOSIS — M4806 Spinal stenosis, lumbar region: Secondary | ICD-10-CM | POA: Diagnosis not present

## 2016-04-03 DIAGNOSIS — M4727 Other spondylosis with radiculopathy, lumbosacral region: Secondary | ICD-10-CM | POA: Diagnosis not present

## 2016-04-03 DIAGNOSIS — M25552 Pain in left hip: Secondary | ICD-10-CM | POA: Diagnosis not present

## 2016-05-04 ENCOUNTER — Other Ambulatory Visit: Payer: Self-pay | Admitting: Cardiology

## 2016-05-04 NOTE — Telephone Encounter (Signed)
Jenna Hernandez  Peter M Martinique, MD at 03/19/2016 6:25 PM  losartan (COZAAR) 25 MG tablet TAKE ONE TABLET BY MOUTH ONCE DAILY   Current medicines are reviewed at length with the patient today.  The patient did not have any concerns regarding medicines.  Patient Instructions   Continue your current therapy

## 2016-06-02 ENCOUNTER — Other Ambulatory Visit: Payer: Self-pay | Admitting: Cardiology

## 2016-06-04 NOTE — Telephone Encounter (Signed)
Rx request sent to pharmacy.  

## 2016-06-12 DIAGNOSIS — R739 Hyperglycemia, unspecified: Secondary | ICD-10-CM | POA: Diagnosis not present

## 2016-06-12 DIAGNOSIS — M797 Fibromyalgia: Secondary | ICD-10-CM | POA: Diagnosis not present

## 2016-06-12 DIAGNOSIS — I10 Essential (primary) hypertension: Secondary | ICD-10-CM | POA: Diagnosis not present

## 2016-06-12 DIAGNOSIS — E782 Mixed hyperlipidemia: Secondary | ICD-10-CM | POA: Diagnosis not present

## 2016-06-12 DIAGNOSIS — K5900 Constipation, unspecified: Secondary | ICD-10-CM | POA: Diagnosis not present

## 2016-06-12 DIAGNOSIS — Z6829 Body mass index (BMI) 29.0-29.9, adult: Secondary | ICD-10-CM | POA: Diagnosis not present

## 2016-06-12 DIAGNOSIS — Z1389 Encounter for screening for other disorder: Secondary | ICD-10-CM | POA: Diagnosis not present

## 2016-06-12 DIAGNOSIS — F411 Generalized anxiety disorder: Secondary | ICD-10-CM | POA: Diagnosis not present

## 2016-08-04 ENCOUNTER — Other Ambulatory Visit: Payer: Self-pay | Admitting: Cardiology

## 2016-08-04 ENCOUNTER — Other Ambulatory Visit: Payer: Self-pay | Admitting: Physician Assistant

## 2016-08-19 ENCOUNTER — Other Ambulatory Visit: Payer: Self-pay | Admitting: Physician Assistant

## 2016-08-21 ENCOUNTER — Other Ambulatory Visit: Payer: Self-pay

## 2016-08-21 MED ORDER — PRAVASTATIN SODIUM 20 MG PO TABS: ORAL_TABLET | ORAL | 1 refills | Status: DC

## 2016-08-21 NOTE — Telephone Encounter (Signed)
Rx(s) sent to pharmacy electronically.  

## 2016-10-02 DIAGNOSIS — E559 Vitamin D deficiency, unspecified: Secondary | ICD-10-CM | POA: Diagnosis not present

## 2016-10-09 DIAGNOSIS — Z6829 Body mass index (BMI) 29.0-29.9, adult: Secondary | ICD-10-CM | POA: Diagnosis not present

## 2016-10-09 DIAGNOSIS — J0101 Acute recurrent maxillary sinusitis: Secondary | ICD-10-CM | POA: Diagnosis not present

## 2016-11-07 DIAGNOSIS — L570 Actinic keratosis: Secondary | ICD-10-CM | POA: Diagnosis not present

## 2016-11-27 NOTE — Progress Notes (Signed)
Cardiology Office Note  Date: 11/28/2016   ID: Jenna Hernandez, DOB Mar 14, 1951, MRN [id number]  PCP: Manon Hilding, MD  Evaluating Cardiologist: Rozann Lesches, MD   Chief Complaint  Patient presents with  . History of coronary vasospasm    History of Present Illness: Jenna Hernandez is a 66 y.o. female patient of Dr. Martinique and previously Dr. Mare Ferrari, now establishing follow-up in the [facility] office. This is our first meeting. I reviewed her records and updated the chart. Cardiac history includes prior NSTEMI felt to be secondary to possible coronary vasospasm versus thrombotic small vessel disease. Recent cardiac catheterization in September 2016 revealed normal coronary arteries. She last saw Dr. Martinique in June 2017.  She works in Associate Professor at [employer]. States that she has been under a lot of stress with work, also aging parents with medical problems. She states that she has had trouble with neck pain, possibly arthritic, also has a history of fibromyalgia. She has been concerned however because some of her prior symptoms with cardiac presentations were related to neck pain. Her symptoms are nonexertional. She has not been significantly short of breath with activity. She does not have fresh nitroglycerin.  I reviewed her current medications which are outlined below. Current cardiac regimen includes aspirin, Norvasc, Imdur, Cozaar, and Pravachol.  I reviewed her ECG today which shows normal sinus rhythm with small R' in lead V1.  Past Medical History:  Diagnosis Date  . Antral gastritis    a. EGD 06/2015  . Anxiety   . CAD (coronary artery disease)    a. NSTEMI 12/2011 - cath smooth/normal cors, ? vasospasm with thrombosis given HK in inferior wall - readmitted same month - MD reviewed films and felt she may have had small branch occlusion off RCA accounting for WMA. b. Subsequent workups in 2014, 2015 normal. c. Readmitted 06/2015 with CP/+ troponin - cath  showed no significant CAD, LVEF 55-65%  . Chronic back pain   . Coronary vasospasm ([place])   . Diverticulosis   . Essential hypertension   . Fibromyalgia   . GERD (gastroesophageal reflux disease)   . H/O hiatal hernia   . Hypercholesterolemia   . Obesity   . Osteoarthritis   . PUD (peptic ulcer disease)    a. EGD 06/2015  . Pulmonary nodules    a. Incidentally noted on CT by GI 06/2015    Past Surgical History:  Procedure Laterality Date  . CARDIAC CATHETERIZATION  12/2011  . CARDIAC CATHETERIZATION N/A 06/09/2015   Procedure: Left Heart Cath and Coronary Angiography;  Surgeon: Jettie Booze, MD;  Location: [facility];  Service: Cardiovascular;  Laterality: N/A;  . COLONOSCOPY  08/2013   [facility] /Dr.Benson  . CYSTECTOMY  ?1990's   "from my back"  . ESOPHAGOGASTRODUODENOSCOPY N/A 06/10/2015   Procedure: ESOPHAGOGASTRODUODENOSCOPY (EGD);  Surgeon: Rogene Houston, MD;  [facility];  Service: Endoscopy;  Laterality: N/A;  140  . INGUINAL HERNIA REPAIR Right ~ 1983  . LAPAROSCOPIC CHOLECYSTECTOMY  ~ 2004  . LEFT HEART CATHETERIZATION WITH CORONARY ANGIOGRAM N/A 12/13/2011   Procedure: LEFT HEART CATHETERIZATION WITH CORONARY ANGIOGRAM;  Surgeon: Thayer Headings, MD;  Location: [facility] CATH LAB;  Service: Cardiovascular;  Laterality: N/A;  . TUBAL LIGATION  1979  . UPPER GASTROINTESTINAL ENDOSCOPY  09/2012   [facility]/Dr.Benson  . VAGINAL HYSTERECTOMY  1990    Current Outpatient Prescriptions  Medication Sig Dispense Refill  . acetaminophen (TYLENOL) 500 MG tablet  Take 1,000 mg by mouth every 6 (six) hours as needed (pain).    Marland Kitchen amLODipine (NORVASC) 5 MG tablet Take 1 tablet (5 mg total) by mouth daily. 90 tablet 1  . aspirin EC 81 MG tablet Take 81 mg by mouth at bedtime.    . Cholecalciferol (VITAMIN D) 2000 UNITS tablet Take 2,000 Units by mouth once a week.     . diazepam (VALIUM) 2 MG tablet Take 1 mg by mouth at bedtime. Occasionally takes 1/2 tab am    . isosorbide  mononitrate (IMDUR) 30 MG 24 hr tablet Take 1 tablet (30 mg total) by mouth daily. 30 tablet 7  . losartan (COZAAR) 25 MG tablet TAKE ONE TABLET BY MOUTH ONCE DAILY 30 tablet 11  . Multiple Vitamin (MULTIVITAMIN) tablet Take 1 tablet by mouth daily.    . nitroGLYCERIN (NITROSTAT) 0.4 MG SL tablet Place 1 tablet (0.4 mg total) under the tongue every 5 (five) minutes as needed for chest pain (up to 3 doses). 25 tablet 3  . pantoprazole (PROTONIX) 40 MG tablet Take 40 mg by mouth every morning.     . pravastatin (PRAVACHOL) 20 MG tablet TAKE ONE TABLET BY MOUTH ONCE DAILY 90 tablet 3  . ranitidine (ZANTAC) 150 MG tablet Take 1 tablet (150 mg total) by mouth at bedtime.    . Wheat Dextrin (BENEFIBER PO) Take by mouth daily.      No current facility-administered medications for this visit.    Allergies:  Diclofenac sodium; Meperidine-promethazine; Ramipril; Raspberry; Codeine; Prednisone; and Sulfa antibiotics   Social History: The patient  reports that she quit smoking about 4 years ago. Her smoking use included Cigarettes. She has a 11.50 pack-year smoking history. She has never used smokeless tobacco. She reports that she does not drink alcohol or use drugs.   Family History: The patient's family history includes Bladder Cancer in her mother; Colon cancer in her mother; Coronary artery disease (age of onset: 48) in her father; Diabetes in her father and sister; Fibromyalgia in her mother; Heart attack (age of onset: 44) in her father; Irregular heart beat in her mother; Osteoarthritis in her mother; Osteoporosis in her mother.   ROS:  Please see the history of present illness. Otherwise, complete review of systems is positive for joint stiffness.  All other systems are reviewed and negative.   Physical Exam: VS:  BP 130/82   Pulse 78   Ht 5\' 3"  (1.6 m)   Wt 172 lb 3.2 oz (78.1 kg)   SpO2 98%   BMI 30.50 kg/m , BMI Body mass index is 30.5 kg/m.  Wt Readings from Last 3 Encounters:    11/28/16 172 lb 3.2 oz (78.1 kg)  03/19/16 177 lb 3.2 oz (80.4 kg)  08/03/15 175 lb (79.4 kg)    General: Overweight woman, appears comfortable at rest. HEENT: Conjunctiva and lids normal, oropharynx clear. Neck: Supple, no elevated JVP or carotid bruits, no thyromegaly. Lungs: Clear to auscultation, nonlabored breathing at rest. Cardiac: Regular rate and rhythm, no S3 or significant systolic murmur, no pericardial rub. Abdomen: Soft, nontender, bowel sounds present. Extremities: No pitting edema, distal pulses 2+. Skin: Warm and dry. Musculoskeletal: No kyphosis. Neuropsychiatric: Alert and oriented x3, affect grossly appropriate.  ECG: I personally reviewed the tracing from 08/03/2015 which showed normal sinus rhythm with Q waves in leads III and aVF.  Recent Labwork:    Component Value Date/Time   CHOL 163 06/09/2015 0105   TRIG 62 06/09/2015 0105  HDL 66 06/09/2015 0105   CHOLHDL 2.5 06/09/2015 0105   VLDL 12 06/09/2015 0105   LDLCALC 85 06/09/2015 0105    Other Studies Reviewed Today:  Cardiac catheterization 06/09/2015:  The left ventricular systolic function is normal.  No significant CAD.  Assessment and Plan:  1. History of suspected vasospastic angina, rule out transient thrombotic event back in 2013. Subsequent assessment has shown normal coronary arteries. I reviewed her ECG today which is nonspecific overall. Current regimen includes Imdur and Norvasc, also aspirin and statin. We are providing a refill for fresh nitroglycerin. Recommended a walking regimen for exercise.  2. Hyperlipidemia, on Pravachol. LDL 85 in 2016. She follows with Dr. Quintin Alto.  3. Essential hypertension by history, no changes made to current regimen.  4. GERD, on Zantac.  Current medicines were reviewed with the patient today.   Orders Placed This Encounter  Procedures  . EKG 12-Lead    Disposition: Follow-up in 6 months.  Signed, Satira Sark, MD, [facility] 11/28/2016 9:11  AM    [facility] at [place], [place], [place] [address] Phone: [phone]; Fax: [phone]

## 2016-11-28 ENCOUNTER — Encounter: Payer: Self-pay | Admitting: Cardiology

## 2016-11-28 ENCOUNTER — Ambulatory Visit (INDEPENDENT_AMBULATORY_CARE_PROVIDER_SITE_OTHER): Payer: PRIVATE HEALTH INSURANCE | Admitting: Cardiology

## 2016-11-28 VITALS — BP 130/82 | HR 78 | Ht 63.0 in | Wt 172.2 lb

## 2016-11-28 DIAGNOSIS — I10 Essential (primary) hypertension: Secondary | ICD-10-CM | POA: Diagnosis not present

## 2016-11-28 DIAGNOSIS — Z8679 Personal history of other diseases of the circulatory system: Secondary | ICD-10-CM | POA: Diagnosis not present

## 2016-11-28 DIAGNOSIS — K219 Gastro-esophageal reflux disease without esophagitis: Secondary | ICD-10-CM

## 2016-11-28 DIAGNOSIS — E782 Mixed hyperlipidemia: Secondary | ICD-10-CM | POA: Diagnosis not present

## 2016-11-28 MED ORDER — NITROGLYCERIN 0.4 MG SL SUBL: 0.4000 mg | SUBLINGUAL_TABLET | SUBLINGUAL | 3 refills | Status: AC | PRN

## 2016-11-28 NOTE — Patient Instructions (Signed)
Your physician wants you to follow-up in: 6 months with Dr. McDowell You will receive a reminder letter in the mail two months in advance. If you don't receive a letter, please call our office to schedule the follow-up appointment.  Your physician recommends that you continue on your current medications as directed. Please refer to the Current Medication list given to you today.  Thank you for choosing [facility]!!    

## 2016-12-03 ENCOUNTER — Other Ambulatory Visit: Payer: Self-pay | Admitting: Cardiology

## 2016-12-11 DIAGNOSIS — E782 Mixed hyperlipidemia: Secondary | ICD-10-CM | POA: Diagnosis not present

## 2016-12-11 DIAGNOSIS — M797 Fibromyalgia: Secondary | ICD-10-CM | POA: Diagnosis not present

## 2016-12-11 DIAGNOSIS — I10 Essential (primary) hypertension: Secondary | ICD-10-CM | POA: Diagnosis not present

## 2016-12-11 DIAGNOSIS — R739 Hyperglycemia, unspecified: Secondary | ICD-10-CM | POA: Diagnosis not present

## 2016-12-11 DIAGNOSIS — R1013 Epigastric pain: Secondary | ICD-10-CM | POA: Diagnosis not present

## 2017-01-15 DIAGNOSIS — Z7982 Long term (current) use of aspirin: Secondary | ICD-10-CM | POA: Diagnosis not present

## 2017-01-15 DIAGNOSIS — R0602 Shortness of breath: Secondary | ICD-10-CM | POA: Diagnosis not present

## 2017-01-15 DIAGNOSIS — R0789 Other chest pain: Secondary | ICD-10-CM | POA: Diagnosis not present

## 2017-01-15 DIAGNOSIS — K449 Diaphragmatic hernia without obstruction or gangrene: Secondary | ICD-10-CM | POA: Diagnosis not present

## 2017-01-15 DIAGNOSIS — H9201 Otalgia, right ear: Secondary | ICD-10-CM | POA: Diagnosis not present

## 2017-01-15 DIAGNOSIS — M797 Fibromyalgia: Secondary | ICD-10-CM | POA: Diagnosis not present

## 2017-01-15 DIAGNOSIS — Z79899 Other long term (current) drug therapy: Secondary | ICD-10-CM | POA: Diagnosis not present

## 2017-01-15 DIAGNOSIS — E785 Hyperlipidemia, unspecified: Secondary | ICD-10-CM | POA: Diagnosis not present

## 2017-01-15 DIAGNOSIS — R079 Chest pain, unspecified: Secondary | ICD-10-CM | POA: Diagnosis not present

## 2017-01-15 DIAGNOSIS — J329 Chronic sinusitis, unspecified: Secondary | ICD-10-CM | POA: Diagnosis not present

## 2017-01-15 DIAGNOSIS — F419 Anxiety disorder, unspecified: Secondary | ICD-10-CM | POA: Diagnosis not present

## 2017-01-15 DIAGNOSIS — Z87891 Personal history of nicotine dependence: Secondary | ICD-10-CM | POA: Diagnosis not present

## 2017-01-15 DIAGNOSIS — Z90710 Acquired absence of both cervix and uterus: Secondary | ICD-10-CM | POA: Diagnosis not present

## 2017-01-15 DIAGNOSIS — I2510 Atherosclerotic heart disease of native coronary artery without angina pectoris: Secondary | ICD-10-CM | POA: Diagnosis not present

## 2017-01-15 DIAGNOSIS — K219 Gastro-esophageal reflux disease without esophagitis: Secondary | ICD-10-CM | POA: Diagnosis not present

## 2017-01-15 DIAGNOSIS — J302 Other seasonal allergic rhinitis: Secondary | ICD-10-CM | POA: Diagnosis not present

## 2017-01-15 DIAGNOSIS — I252 Old myocardial infarction: Secondary | ICD-10-CM | POA: Diagnosis not present

## 2017-01-15 DIAGNOSIS — K589 Irritable bowel syndrome without diarrhea: Secondary | ICD-10-CM | POA: Diagnosis not present

## 2017-01-15 DIAGNOSIS — Z9851 Tubal ligation status: Secondary | ICD-10-CM | POA: Diagnosis not present

## 2017-01-15 DIAGNOSIS — M542 Cervicalgia: Secondary | ICD-10-CM | POA: Diagnosis not present

## 2017-01-15 DIAGNOSIS — R070 Pain in throat: Secondary | ICD-10-CM | POA: Diagnosis not present

## 2017-01-15 DIAGNOSIS — Z9049 Acquired absence of other specified parts of digestive tract: Secondary | ICD-10-CM | POA: Diagnosis not present

## 2017-01-15 DIAGNOSIS — I10 Essential (primary) hypertension: Secondary | ICD-10-CM | POA: Diagnosis not present

## 2017-04-05 ENCOUNTER — Other Ambulatory Visit: Payer: Self-pay | Admitting: Cardiology

## 2017-04-15 DIAGNOSIS — M79672 Pain in left foot: Secondary | ICD-10-CM | POA: Diagnosis not present

## 2017-04-25 DIAGNOSIS — Z87891 Personal history of nicotine dependence: Secondary | ICD-10-CM | POA: Diagnosis not present

## 2017-04-25 DIAGNOSIS — M1990 Unspecified osteoarthritis, unspecified site: Secondary | ICD-10-CM | POA: Diagnosis not present

## 2017-04-25 DIAGNOSIS — K219 Gastro-esophageal reflux disease without esophagitis: Secondary | ICD-10-CM | POA: Diagnosis not present

## 2017-04-25 DIAGNOSIS — Z79899 Other long term (current) drug therapy: Secondary | ICD-10-CM | POA: Diagnosis not present

## 2017-04-25 DIAGNOSIS — I10 Essential (primary) hypertension: Secondary | ICD-10-CM | POA: Diagnosis not present

## 2017-04-25 DIAGNOSIS — M797 Fibromyalgia: Secondary | ICD-10-CM | POA: Diagnosis not present

## 2017-04-25 DIAGNOSIS — I252 Old myocardial infarction: Secondary | ICD-10-CM | POA: Diagnosis not present

## 2017-04-25 DIAGNOSIS — R0789 Other chest pain: Secondary | ICD-10-CM | POA: Diagnosis not present

## 2017-05-05 ENCOUNTER — Other Ambulatory Visit: Payer: Self-pay | Admitting: Cardiology

## 2017-05-24 NOTE — Progress Notes (Signed)
Cardiology Office Note  Date: 05/27/2017   ID: Jenna, Hernandez 06/06/1951, MRN [id number]  PCP: Manon Hilding, MD  Primary Cardiologist: Rozann Lesches, MD   Chief Complaint  Patient presents with  . Cardiac follow-up    History of Present Illness: Jenna Hernandez is a 66 y.o. female that I met in February of this year. She presents for a routine follow-up visit. 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 She has been under a lot of stress, her mother has advanced cancer and is declining. She visits her parents daily. She has not been exercising, we did talk about a walking regimen.  She states that she had an episode of chest pain back in July and was seen in the ER, had negative enzymes, and stress was felt to be the etiology. She continues on medical therapy including aspirin, Norvasc, Imdur, Cozaar, Pravachol, and as needed nitroglycerin.  She continues to follow with Dr. Quintin Alto. States that she has a pending visit with lab work in the next few months. Reports no intolerances to Pravachol.  Past Medical History:  Diagnosis Date  . Antral gastritis    a. EGD 06/2015  . Anxiety   . CAD (coronary artery disease)    a. NSTEMI 12/2011 - cath smooth/normal cors, ? vasospasm with thrombosis given HK in inferior wall - readmitted same month - MD reviewed films and felt she may have had small branch occlusion off RCA accounting for WMA. b. Subsequent workups in 2014, 2015 normal. c. Readmitted 06/2015 with CP/+ troponin - cath showed no significant CAD, LVEF 55-65%  . Chronic back pain   . Coronary vasospasm ([place])   . Diverticulosis   . Essential hypertension   . Fibromyalgia   . GERD (gastroesophageal reflux disease)   . H/O hiatal hernia   . Hypercholesterolemia   . Obesity   . Osteoarthritis   . PUD (peptic ulcer disease)    a. EGD 06/2015  . Pulmonary nodules    a. Incidentally noted on CT by GI 06/2015    Past Surgical History:    Procedure Laterality Date  . CARDIAC CATHETERIZATION  12/2011  . CARDIAC CATHETERIZATION N/A 06/09/2015   Procedure: Left Heart Cath and Coronary Angiography;  Surgeon: Jettie Booze, MD;  Location: [facility];  Service: Cardiovascular;  Laterality: N/A;  . COLONOSCOPY  08/2013   [facility] /Dr.Benson  . CYSTECTOMY  ?1990's   "from my back"  . ESOPHAGOGASTRODUODENOSCOPY N/A 06/10/2015   Procedure: ESOPHAGOGASTRODUODENOSCOPY (EGD);  Surgeon: Rogene Houston, MD;  [facility];  Service: Endoscopy;  Laterality: N/A;  140  . INGUINAL HERNIA REPAIR Right ~ 1983  . LAPAROSCOPIC CHOLECYSTECTOMY  ~ 2004  . LEFT HEART CATHETERIZATION WITH CORONARY ANGIOGRAM N/A 12/13/2011   Procedure: LEFT HEART CATHETERIZATION WITH CORONARY ANGIOGRAM;  Surgeon: Thayer Headings, MD;  Location: [facility] CATH LAB;  Service: Cardiovascular;  Laterality: N/A;  . TUBAL LIGATION  1979  . UPPER GASTROINTESTINAL ENDOSCOPY  09/2012   [facility]/Dr.Benson  . VAGINAL HYSTERECTOMY  1990    Current Outpatient Prescriptions  Medication Sig Dispense Refill  . acetaminophen (TYLENOL) 500 MG tablet Take 1,000 mg by mouth every 6 (six) hours as needed (pain).    Marland Kitchen amLODipine (NORVASC) 5 MG tablet TAKE ONE TABLET BY MOUTH ONCE DAILY 90 tablet 1  . aspirin EC 81 MG tablet Take 81 mg by mouth at bedtime.    . Cholecalciferol (VITAMIN D) 2000 UNITS  tablet Take 2,000 Units by mouth once a week.     . diazepam (VALIUM) 2 MG tablet Take 1 mg by mouth at bedtime. Occasionally takes 1/2 tab am    . isosorbide mononitrate (IMDUR) 30 MG 24 hr tablet TAKE 1 TABLET BY MOUTH ONCE DAILY 90 tablet 1  . losartan (COZAAR) 25 MG tablet TAKE ONE TABLET BY MOUTH ONCE DAILY 30 tablet 11  . Multiple Vitamin (MULTIVITAMIN) tablet Take 1 tablet by mouth daily.    . nitroGLYCERIN (NITROSTAT) 0.4 MG SL tablet Place 1 tablet (0.4 mg total) under the tongue every 5 (five) minutes as needed for chest pain (up to 3 doses). 25 tablet 3  . pantoprazole  (PROTONIX) 40 MG tablet Take 40 mg by mouth every morning.     . pravastatin (PRAVACHOL) 20 MG tablet TAKE ONE TABLET BY MOUTH ONCE DAILY 90 tablet 3  . ranitidine (ZANTAC) 150 MG tablet Take 1 tablet (150 mg total) by mouth at bedtime.    . Wheat Dextrin (BENEFIBER PO) Take by mouth daily.      No current facility-administered medications for this visit.    Allergies:  Diclofenac sodium; Meperidine-promethazine; Ramipril; Raspberry; Codeine; Prednisone; and Sulfa antibiotics   Social History: The patient  reports that she quit smoking about 5 years ago. Her smoking use included Cigarettes. She has a 11.50 pack-year smoking history. She has never used smokeless tobacco. She reports that she does not drink alcohol or use drugs.   ROS:  Please see the history of present illness. Otherwise, complete review of systems is positive for insomnia.  All other systems are reviewed and negative.   Physical Exam: VS:  BP 128/78   Pulse 77   Ht '5\' 2"'$  (1.575 m)   Wt 176 lb (79.8 kg)   SpO2 98%   BMI 32.19 kg/m , BMI Body mass index is 32.19 kg/m.  Wt Readings from Last 3 Encounters:  05/27/17 176 lb (79.8 kg)  11/28/16 172 lb 3.2 oz (78.1 kg)  03/19/16 177 lb 3.2 oz (80.4 kg)    General: Overweight woman, appears comfortable at rest. HEENT: Conjunctiva and lids normal, oropharynx clear. Neck: Supple, no elevated JVP or carotid bruits, no thyromegaly. Lungs: Clear to auscultation, nonlabored breathing at rest. Cardiac: Regular rate and rhythm, no S3 or significant systolic murmur, no pericardial rub. Abdomen: Soft, nontender, bowel sounds present. Extremities: No pitting edema, distal pulses 2+. Skin: Warm and dry. Musculoskeletal: No kyphosis. Neuropsychiatric: Alert and oriented x3, affect grossly appropriate.  ECG: I personally reviewed the tracing from 11/28/2016 which showed sinus rhythm with R' in lead V1.  Recent Labwork:    Component Value Date/Time   CHOL 163 06/09/2015 0105    TRIG 62 06/09/2015 0105   HDL 66 06/09/2015 0105   CHOLHDL 2.5 06/09/2015 0105   VLDL 12 06/09/2015 0105   LDLCALC 85 06/09/2015 0105    Other Studies Reviewed Today:  Cardiac catheterization 06/09/2015:  The left ventricular systolic function is normal.  No significant CAD.  Assessment and Plan:  1. History of suspected vasospastic angina with normal coronary arteries documented at cardiac catheterization as of 2016. She reports occasional chest pain in the setting of psychosocial stressors. Current cardiac regimen is stable and includes long-acting nitrates as well as Norvasc. I recommended a walking plan for exercise. Continue observation.  2. Hyperlipidemia, on Pravachol. She follows with Dr. Quintin Alto with follow-up lab work pending.  3. Essential hypertension, systolic blood pressure in the 120s today.  4.  GERD on Zantac. Well controlled.  Current medicines were reviewed with the patient today.  Disposition: Follow-up in 6 months.  Signed, Satira Sark, MD, [facility] 05/27/2017 9:59 AM    [place] at [place], [place], [place] [address] Phone: [phone]; Fax: [phone]

## 2017-05-27 ENCOUNTER — Ambulatory Visit (INDEPENDENT_AMBULATORY_CARE_PROVIDER_SITE_OTHER): Payer: Commercial Managed Care - PPO | Admitting: Cardiology

## 2017-05-27 ENCOUNTER — Ambulatory Visit: Payer: Self-pay | Admitting: Cardiology

## 2017-05-27 ENCOUNTER — Encounter: Payer: Self-pay | Admitting: Cardiology

## 2017-05-27 ENCOUNTER — Encounter: Payer: Self-pay | Admitting: *Deleted

## 2017-05-27 VITALS — BP 128/78 | HR 77 | Ht 62.0 in | Wt 176.0 lb

## 2017-05-27 DIAGNOSIS — I10 Essential (primary) hypertension: Secondary | ICD-10-CM

## 2017-05-27 DIAGNOSIS — Z8679 Personal history of other diseases of the circulatory system: Secondary | ICD-10-CM

## 2017-05-27 DIAGNOSIS — K219 Gastro-esophageal reflux disease without esophagitis: Secondary | ICD-10-CM

## 2017-05-27 DIAGNOSIS — E782 Mixed hyperlipidemia: Secondary | ICD-10-CM | POA: Diagnosis not present

## 2017-05-27 NOTE — Patient Instructions (Signed)

## 2017-06-06 DIAGNOSIS — E7800 Pure hypercholesterolemia, unspecified: Secondary | ICD-10-CM | POA: Diagnosis not present

## 2017-06-06 DIAGNOSIS — I10 Essential (primary) hypertension: Secondary | ICD-10-CM | POA: Diagnosis not present

## 2017-06-08 ENCOUNTER — Other Ambulatory Visit: Payer: Self-pay | Admitting: Cardiology

## 2017-06-11 NOTE — Telephone Encounter (Signed)
REFILL 

## 2017-06-13 DIAGNOSIS — E782 Mixed hyperlipidemia: Secondary | ICD-10-CM | POA: Diagnosis not present

## 2017-06-13 DIAGNOSIS — Z6829 Body mass index (BMI) 29.0-29.9, adult: Secondary | ICD-10-CM | POA: Diagnosis not present

## 2017-06-13 DIAGNOSIS — Z1389 Encounter for screening for other disorder: Secondary | ICD-10-CM | POA: Diagnosis not present

## 2017-06-13 DIAGNOSIS — M797 Fibromyalgia: Secondary | ICD-10-CM | POA: Diagnosis not present

## 2017-06-13 DIAGNOSIS — I10 Essential (primary) hypertension: Secondary | ICD-10-CM | POA: Diagnosis not present

## 2017-06-13 DIAGNOSIS — R739 Hyperglycemia, unspecified: Secondary | ICD-10-CM | POA: Diagnosis not present

## 2017-06-13 DIAGNOSIS — K5900 Constipation, unspecified: Secondary | ICD-10-CM | POA: Diagnosis not present

## 2017-06-13 DIAGNOSIS — R1013 Epigastric pain: Secondary | ICD-10-CM | POA: Diagnosis not present

## 2017-07-08 DIAGNOSIS — L720 Epidermal cyst: Secondary | ICD-10-CM | POA: Diagnosis not present

## 2017-08-10 ENCOUNTER — Other Ambulatory Visit: Payer: Self-pay | Admitting: Cardiology

## 2017-09-20 ENCOUNTER — Other Ambulatory Visit: Payer: Self-pay | Admitting: Cardiology

## 2017-09-20 NOTE — Telephone Encounter (Signed)
REFILL 

## 2017-10-14 ENCOUNTER — Telehealth: Payer: Self-pay | Admitting: Cardiology

## 2017-10-14 NOTE — Telephone Encounter (Signed)
Concerned about heart medication.   Saw on FB where two of her medications are being recalled due to having cancer causing agents in them

## 2017-10-14 NOTE — Telephone Encounter (Signed)
No answer

## 2017-10-17 NOTE — Telephone Encounter (Signed)
Discussed medications with patient.  Stated that she had also discussed with her pharmacist & both her medications were fine & had not been recalled.  (Amlodipine & Losartan).  She will continue medications.

## 2017-11-07 ENCOUNTER — Encounter (INDEPENDENT_AMBULATORY_CARE_PROVIDER_SITE_OTHER): Payer: Self-pay | Admitting: *Deleted

## 2017-11-07 ENCOUNTER — Encounter (INDEPENDENT_AMBULATORY_CARE_PROVIDER_SITE_OTHER): Payer: Self-pay | Admitting: Internal Medicine

## 2017-11-07 ENCOUNTER — Ambulatory Visit (INDEPENDENT_AMBULATORY_CARE_PROVIDER_SITE_OTHER): Payer: Commercial Managed Care - PPO | Admitting: Internal Medicine

## 2017-11-07 VITALS — BP 150/80 | HR 60 | Temp 98.1°F | Ht 63.0 in | Wt 177.6 lb

## 2017-11-07 DIAGNOSIS — R1031 Right lower quadrant pain: Secondary | ICD-10-CM | POA: Diagnosis not present

## 2017-11-07 NOTE — Patient Instructions (Signed)
Will get an US abdomen. 

## 2017-11-07 NOTE — Progress Notes (Signed)
Subjective:    Patient ID: Jenna Hernandez, female    DOB: 07/10/1951, 67 y.o.   MRN: [id number]  HPI Presents today with c/o abdominal pain. She says it is time for her to have a colonoscopy. She tells me she has been having some problems with a dull ache on her rt lateral abdomen. Has had the pain off and on for about a year. No change in her stools. No rectal bleeding.  She c/o epigastric pain. She says sometimes everything she eats, she hurts. She says she stays constipated. She takes a stool softener daily now.  Her mother has just died of colon cancer at age 47. She takes Protonix in am and Zantac at night. She says sometimes it does help but sometimes has to take an extra Zantac during the day. She tries to stay away from spicy foods.   Her last colonoscopy was 5 yrs ago. 08/10/2013 Family hx of colon cancer. Dr. Britta Mccreedy. Moderate diverticulosis was found in the sigmoid colon.      Underwent an EGD in September of 2016(epigastric pain ) which revealed Impression: Small sliding hiatal hernia without evidence of erosive esophagitis. Nonerosive antral gastritis. Antral biopsy taken for routine histology Evidence of peptic ulcer disease  Gastric biopsy reveals reactive chemical gastritis possibly due to bile reflux. Review of Systems Past Medical History:  Diagnosis Date  . Antral gastritis    a. EGD 06/2015  . Anxiety   . CAD (coronary artery disease)    a. NSTEMI 12/2011 - cath smooth/normal cors, ? vasospasm with thrombosis given HK in inferior wall - readmitted same month - MD reviewed films and felt she may have had small branch occlusion off RCA accounting for WMA. b. Subsequent workups in 2014, 2015 normal. c. Readmitted 06/2015 with CP/+ troponin - cath showed no significant CAD, LVEF 55-65%  . Chronic back pain   . Coronary vasospasm ([place])   . Diverticulosis   . Essential hypertension   . Fibromyalgia   . GERD (gastroesophageal reflux disease)   . H/O hiatal hernia   .  Hypercholesterolemia   . Obesity   . Osteoarthritis   . PUD (peptic ulcer disease)    a. EGD 06/2015  . Pulmonary nodules    a. Incidentally noted on CT by GI 06/2015    Past Surgical History:  Procedure Laterality Date  . CARDIAC CATHETERIZATION  12/2011  . CARDIAC CATHETERIZATION N/A 06/09/2015   Procedure: Left Heart Cath and Coronary Angiography;  Surgeon: Jettie Booze, MD;  Location: [facility];  Service: Cardiovascular;  Laterality: N/A;  . COLONOSCOPY  08/2013   [facility] /Dr.Benson  . CYSTECTOMY  ?1990's   "from my back"  . ESOPHAGOGASTRODUODENOSCOPY N/A 06/10/2015   Procedure: ESOPHAGOGASTRODUODENOSCOPY (EGD);  Surgeon: Rogene Houston, MD;  [facility];  Service: Endoscopy;  Laterality: N/A;  140  . INGUINAL HERNIA REPAIR Right ~ 1983  . LAPAROSCOPIC CHOLECYSTECTOMY  ~ 2004  . LEFT HEART CATHETERIZATION WITH CORONARY ANGIOGRAM N/A 12/13/2011   Procedure: LEFT HEART CATHETERIZATION WITH CORONARY ANGIOGRAM;  Surgeon: Thayer Headings, MD;  Location: [facility] CATH LAB;  Service: Cardiovascular;  Laterality: N/A;  . TUBAL LIGATION  1979  . UPPER GASTROINTESTINAL ENDOSCOPY  09/2012   [facility]/Dr.Benson  . VAGINAL HYSTERECTOMY  1990    Allergies  Allergen Reactions  . Diclofenac Sodium Anaphylaxis    Tolerates aspirin - was told not to take antiinflammatory meds.  . Meperidine-Promethazine Other (See Comments)    headache  .  Ramipril Other (See Comments)    cough  . Raspberry Hives  . Codeine Rash  . Prednisone Palpitations  . Sulfa Antibiotics Rash    Current Outpatient Medications on File Prior to Visit  Medication Sig Dispense Refill  . acetaminophen (TYLENOL) 500 MG tablet Take 1,000 mg by mouth every 6 (six) hours as needed (pain).    Marland Kitchen amLODipine (NORVASC) 5 MG tablet TAKE 1 TABLET BY MOUTH ONCE DAILY 90 tablet 1  . aspirin EC 81 MG tablet Take 81 mg by mouth at bedtime.    . Cholecalciferol (VITAMIN D) 2000 UNITS tablet Take 1,000 Units by mouth once a  week.     . diazepam (VALIUM) 2 MG tablet Take 1 mg by mouth at bedtime. Occasionally takes 1/2 tab am    . isosorbide mononitrate (IMDUR) 30 MG 24 hr tablet TAKE 1 TABLET BY MOUTH ONCE DAILY 90 tablet 1  . losartan (COZAAR) 25 MG tablet TAKE ONE TABLET BY MOUTH ONCE DAILY 90 tablet 2  . Multiple Vitamin (MULTIVITAMIN) tablet Take 1 tablet by mouth daily.    . nitroGLYCERIN (NITROSTAT) 0.4 MG SL tablet Place 1 tablet (0.4 mg total) under the tongue every 5 (five) minutes as needed for chest pain (up to 3 doses). 25 tablet 3  . NON FORMULARY IBGARD as needed for IBS    . pantoprazole (PROTONIX) 40 MG tablet Take 40 mg by mouth every morning.     . pravastatin (PRAVACHOL) 20 MG tablet TAKE ONE TABLET BY MOUTH ONCE DAILY 30 tablet 5  . ranitidine (ZANTAC) 150 MG tablet Take 1 tablet (150 mg total) by mouth at bedtime.    . Wheat Dextrin (BENEFIBER PO) Take by mouth daily.      No current facility-administered medications on file prior to visit.         Objective:   Physical Exam Blood pressure (!) 150/80, pulse 60, temperature 98.1 F (36.7 C), height 5\' 3"  (1.6 m), weight 177 lb 9.6 oz (80.6 kg). Alert and oriented. Skin warm and dry. Oral mucosa is moist.   . Sclera anicteric, conjunctivae is pink. Thyroid not enlarged. No cervical lymphadenopathy. Lungs clear. Heart regular rate and rhythm.  Abdomen is soft. Bowel sounds are positive. No hepatomegaly. No abdominal masses felt. No tenderness.  No edema to lower extremities.           Assessment & Plan:  Abdominal pain: Will get an US abdomen/pelvis.  Continue the Protonix and Zantac. Further recommendations to follow.

## 2017-11-16 ENCOUNTER — Other Ambulatory Visit: Payer: Self-pay | Admitting: Cardiology

## 2017-11-26 ENCOUNTER — Encounter (HOSPITAL_COMMUNITY): Payer: Self-pay

## 2017-11-26 ENCOUNTER — Ambulatory Visit (HOSPITAL_COMMUNITY)
Admission: RE | Admit: 2017-11-26 | Discharge: 2017-11-26 | Disposition: A | Discharge status: Home / Self Care | Payer: Commercial Managed Care - PPO | Source: Ambulatory Visit | Attending: Internal Medicine | Admitting: Internal Medicine

## 2017-11-26 DIAGNOSIS — R1031 Right lower quadrant pain: Secondary | ICD-10-CM

## 2017-11-28 DIAGNOSIS — K7689 Other specified diseases of liver: Secondary | ICD-10-CM | POA: Diagnosis not present

## 2017-11-28 DIAGNOSIS — R109 Unspecified abdominal pain: Secondary | ICD-10-CM | POA: Diagnosis not present

## 2017-11-28 DIAGNOSIS — Z9049 Acquired absence of other specified parts of digestive tract: Secondary | ICD-10-CM | POA: Diagnosis not present

## 2017-12-05 DIAGNOSIS — E7800 Pure hypercholesterolemia, unspecified: Secondary | ICD-10-CM | POA: Diagnosis not present

## 2017-12-05 DIAGNOSIS — I10 Essential (primary) hypertension: Secondary | ICD-10-CM | POA: Diagnosis not present

## 2017-12-10 DIAGNOSIS — I10 Essential (primary) hypertension: Secondary | ICD-10-CM | POA: Diagnosis not present

## 2017-12-10 DIAGNOSIS — K5900 Constipation, unspecified: Secondary | ICD-10-CM | POA: Diagnosis not present

## 2017-12-10 DIAGNOSIS — R739 Hyperglycemia, unspecified: Secondary | ICD-10-CM | POA: Diagnosis not present

## 2017-12-10 DIAGNOSIS — M542 Cervicalgia: Secondary | ICD-10-CM | POA: Diagnosis not present

## 2017-12-10 DIAGNOSIS — M5030 Other cervical disc degeneration, unspecified cervical region: Secondary | ICD-10-CM | POA: Diagnosis not present

## 2017-12-10 DIAGNOSIS — Z0000 Encounter for general adult medical examination without abnormal findings: Secondary | ICD-10-CM | POA: Diagnosis not present

## 2017-12-10 DIAGNOSIS — R1013 Epigastric pain: Secondary | ICD-10-CM | POA: Diagnosis not present

## 2017-12-10 DIAGNOSIS — E782 Mixed hyperlipidemia: Secondary | ICD-10-CM | POA: Diagnosis not present

## 2017-12-10 DIAGNOSIS — M797 Fibromyalgia: Secondary | ICD-10-CM | POA: Diagnosis not present

## 2017-12-10 DIAGNOSIS — Z6829 Body mass index (BMI) 29.0-29.9, adult: Secondary | ICD-10-CM | POA: Diagnosis not present

## 2017-12-10 DIAGNOSIS — F411 Generalized anxiety disorder: Secondary | ICD-10-CM | POA: Diagnosis not present

## 2017-12-12 ENCOUNTER — Telehealth (INDEPENDENT_AMBULATORY_CARE_PROVIDER_SITE_OTHER): Payer: Self-pay | Admitting: Internal Medicine

## 2017-12-12 NOTE — Telephone Encounter (Signed)
err

## 2017-12-12 NOTE — Telephone Encounter (Signed)
Needs recall for screening colonoscopy for November of 2019.  Family hx colon cancer

## 2018-01-02 DIAGNOSIS — E559 Vitamin D deficiency, unspecified: Secondary | ICD-10-CM | POA: Diagnosis not present

## 2018-01-02 DIAGNOSIS — Z01419 Encounter for gynecological examination (general) (routine) without abnormal findings: Secondary | ICD-10-CM | POA: Diagnosis not present

## 2018-01-02 DIAGNOSIS — Z780 Asymptomatic menopausal state: Secondary | ICD-10-CM | POA: Diagnosis not present

## 2018-01-02 DIAGNOSIS — M85851 Other specified disorders of bone density and structure, right thigh: Secondary | ICD-10-CM | POA: Diagnosis not present

## 2018-01-28 DIAGNOSIS — R0789 Other chest pain: Secondary | ICD-10-CM | POA: Diagnosis not present

## 2018-01-28 DIAGNOSIS — Z9851 Tubal ligation status: Secondary | ICD-10-CM | POA: Diagnosis not present

## 2018-01-28 DIAGNOSIS — K219 Gastro-esophageal reflux disease without esophagitis: Secondary | ICD-10-CM | POA: Diagnosis not present

## 2018-01-28 DIAGNOSIS — F419 Anxiety disorder, unspecified: Secondary | ICD-10-CM | POA: Diagnosis not present

## 2018-01-28 DIAGNOSIS — M1990 Unspecified osteoarthritis, unspecified site: Secondary | ICD-10-CM | POA: Diagnosis not present

## 2018-01-28 DIAGNOSIS — Z7982 Long term (current) use of aspirin: Secondary | ICD-10-CM | POA: Diagnosis not present

## 2018-01-28 DIAGNOSIS — E785 Hyperlipidemia, unspecified: Secondary | ICD-10-CM | POA: Diagnosis not present

## 2018-01-28 DIAGNOSIS — Z87891 Personal history of nicotine dependence: Secondary | ICD-10-CM | POA: Diagnosis not present

## 2018-01-28 DIAGNOSIS — Z888 Allergy status to other drugs, medicaments and biological substances status: Secondary | ICD-10-CM | POA: Diagnosis not present

## 2018-01-28 DIAGNOSIS — K589 Irritable bowel syndrome without diarrhea: Secondary | ICD-10-CM | POA: Diagnosis not present

## 2018-01-28 DIAGNOSIS — I2510 Atherosclerotic heart disease of native coronary artery without angina pectoris: Secondary | ICD-10-CM | POA: Diagnosis not present

## 2018-01-28 DIAGNOSIS — M797 Fibromyalgia: Secondary | ICD-10-CM | POA: Diagnosis not present

## 2018-01-28 DIAGNOSIS — Z79899 Other long term (current) drug therapy: Secondary | ICD-10-CM | POA: Diagnosis not present

## 2018-01-28 DIAGNOSIS — Z9049 Acquired absence of other specified parts of digestive tract: Secondary | ICD-10-CM | POA: Diagnosis not present

## 2018-01-28 DIAGNOSIS — I10 Essential (primary) hypertension: Secondary | ICD-10-CM | POA: Diagnosis not present

## 2018-01-28 DIAGNOSIS — Z90710 Acquired absence of both cervix and uterus: Secondary | ICD-10-CM | POA: Diagnosis not present

## 2018-01-28 DIAGNOSIS — Z886 Allergy status to analgesic agent status: Secondary | ICD-10-CM | POA: Diagnosis not present

## 2018-01-28 DIAGNOSIS — I252 Old myocardial infarction: Secondary | ICD-10-CM | POA: Diagnosis not present

## 2018-01-28 DIAGNOSIS — Z91018 Allergy to other foods: Secondary | ICD-10-CM | POA: Diagnosis not present

## 2018-01-29 DIAGNOSIS — K219 Gastro-esophageal reflux disease without esophagitis: Secondary | ICD-10-CM | POA: Diagnosis not present

## 2018-01-29 DIAGNOSIS — R0789 Other chest pain: Secondary | ICD-10-CM | POA: Diagnosis not present

## 2018-01-29 DIAGNOSIS — M797 Fibromyalgia: Secondary | ICD-10-CM | POA: Diagnosis not present

## 2018-02-06 NOTE — Progress Notes (Signed)
Cardiology Office Note  Date: 02/07/2018   [name], DOB 01/19/1951, MRN [id number]  PCP: Jenna Hilding, MD  Primary Cardiologist: Jenna Lesches, MD   Chief Complaint  Patient presents with  . Cardiac follow-up    History of Present Illness: Jenna Hernandez is a 67 y.o. female last seen in August 2018.  She presents for a follow-up visit.  States that she was recently observed overnight in the ER at [facility] ([place]) secondary to a prolonged episode of chest pain.  She states that nitroglycerin actually made the symptoms worse and she ultimately got better after a GI cocktail.  I have none of these records yet, but she recalls having negative troponin T levels.  We went over her medications which are stable from a cardiac perspective.  She continues to be bothered by significant reflux symptoms and plans to have a follow-up visit with Dr. Laural Hernandez.  She is on Protonix and Zantac.  Past Medical History:  Diagnosis Date  . Antral gastritis    a. EGD 06/2015  . Anxiety   . CAD (coronary artery disease)    a. NSTEMI 12/2011 - cath smooth/normal cors, ? vasospasm with thrombosis given HK in inferior wall - readmitted same month - MD reviewed films and felt she may have had small branch occlusion off RCA accounting for WMA. b. Subsequent workups in 2014, 2015 normal. c. Readmitted 06/2015 with CP/+ troponin - cath showed no significant CAD, LVEF 55-65%  . Chronic back pain   . Coronary vasospasm ([place])   . Diverticulosis   . Essential hypertension   . Fibromyalgia   . GERD (gastroesophageal reflux disease)   . H/O hiatal hernia   . Hypercholesterolemia   . Obesity   . Osteoarthritis   . PUD (peptic ulcer disease)    a. EGD 06/2015  . Pulmonary nodules    a. Incidentally noted on CT by GI 06/2015    Past Surgical History:  Procedure Laterality Date  . CARDIAC CATHETERIZATION  12/2011  . CARDIAC CATHETERIZATION N/A 06/09/2015   Procedure: Left Heart Cath and  Coronary Angiography;  Surgeon: Jenna Booze, MD;  Location: [place] CV LAB;  Service: Cardiovascular;  Laterality: N/A;  . COLONOSCOPY  08/2013   [facility] /Dr.Benson  . CYSTECTOMY  ?1990's   "from my back"  . ESOPHAGOGASTRODUODENOSCOPY N/A 06/10/2015   Procedure: ESOPHAGOGASTRODUODENOSCOPY (EGD);  Surgeon: Jenna Houston, MD;  [facility];  Service: Endoscopy;  Laterality: N/A;  140  . INGUINAL HERNIA REPAIR Right ~ 1983  . LAPAROSCOPIC CHOLECYSTECTOMY  ~ 2004  . LEFT HEART CATHETERIZATION WITH CORONARY ANGIOGRAM N/A 12/13/2011   Procedure: LEFT HEART CATHETERIZATION WITH CORONARY ANGIOGRAM;  Surgeon: Jenna Headings, MD;  Location: [facility] CATH LAB;  Service: Cardiovascular;  Laterality: N/A;  . TUBAL LIGATION  1979  . UPPER GASTROINTESTINAL ENDOSCOPY  09/2012   [facility]/Dr.Benson  . VAGINAL HYSTERECTOMY  1990    Current Outpatient Medications  Medication Sig Dispense Refill  . acetaminophen (TYLENOL) 500 MG tablet Take 1,000 mg by mouth every 6 (six) hours as needed (pain).    Marland Kitchen amLODipine (NORVASC) 5 MG tablet TAKE 1 TABLET BY MOUTH ONCE DAILY 90 tablet 1  . aspirin EC 81 MG tablet Take 81 mg by mouth at bedtime.    . Cholecalciferol (VITAMIN D) 2000 UNITS tablet Take 1,000 Units by mouth once a week.     . diazepam (VALIUM) 2 MG tablet Take 1 mg by  mouth at bedtime. Occasionally takes 1/2 tab am    . isosorbide mononitrate (IMDUR) 30 MG 24 hr tablet TAKE 1 TABLET BY MOUTH ONCE DAILY 90 tablet 1  . losartan (COZAAR) 25 MG tablet TAKE ONE TABLET BY MOUTH ONCE DAILY 90 tablet 2  . Multiple Vitamin (MULTIVITAMIN) tablet Take 1 tablet by mouth daily.    . nitroGLYCERIN (NITROSTAT) 0.4 MG SL tablet Place 1 tablet (0.4 mg total) under the tongue every 5 (five) minutes as needed for chest pain (up to 3 doses). 25 tablet 3  . NON FORMULARY IBGARD as needed for IBS    . pantoprazole (PROTONIX) 40 MG tablet Take 40 mg by mouth every morning.     . pravastatin (PRAVACHOL) 20 MG tablet  TAKE ONE TABLET BY MOUTH ONCE DAILY 30 tablet 5  . ranitidine (ZANTAC) 150 MG tablet Take 1 tablet (150 mg total) by mouth at bedtime.    . Wheat Dextrin (BENEFIBER PO) Take by mouth daily.      No current facility-administered medications for this visit.    Allergies:  Diclofenac sodium; Meperidine-promethazine; Ramipril; Raspberry; Codeine; Prednisone; and Sulfa antibiotics   Social History: The patient  reports that she quit smoking about 6 years ago. Her smoking use included cigarettes. She has a 11.50 pack-year smoking history. She has never used smokeless tobacco. She reports that she does not drink alcohol or use drugs.   ROS:  Please see the history of present illness. Otherwise, complete review of systems is positive for none.  All other systems are reviewed and negative.   Physical Exam: VS:  BP (!) 144/88   Pulse 63   Ht 5\' 3"  (1.6 m)   Wt 182 lb (82.6 kg)   SpO2 96%   BMI 32.24 kg/m , BMI Body mass index is 32.24 kg/m.  Wt Readings from Last 3 Encounters:  02/07/18 182 lb (82.6 kg)  11/07/17 177 lb 9.6 oz (80.6 kg)  05/27/17 176 lb (79.8 kg)    General: Patient appears comfortable at rest. HEENT: Conjunctiva and lids normal, oropharynx clear. Neck: Supple, no elevated JVP or carotid bruits, no thyromegaly. Lungs: Clear to auscultation, nonlabored breathing at rest. Cardiac: Regular rate and rhythm, no S3 or significant systolic murmur. Abdomen: Soft, nontender, bowel sounds present. Extremities: No pitting edema, distal pulses 2+. Skin: Warm and dry. Musculoskeletal: No kyphosis. Neuropsychiatric: Alert and oriented x3, affect grossly appropriate.  ECG: I personally reviewed the tracing from 04/25/2017 which shows sinus rhythm with R' in lead V1, nonspecific T wave changes, possible left atrial enlargement.  Recent Labwork:  February 2019: BUN 23, creatinine 0.71, AST 17, ALT 18, cholesterol 176, triglycerides 60, HDL 76, LDL 88, potassium 4.3  Other Studies  Reviewed Today:  Cardiac catheterization9/10/2014:  The left ventricular systolic function is normal.  No significant CAD.  Assessment and Plan:  1.  Recent episode of chest pain as outlined above, features seem more consistent with GI etiology based on her description.  We will request records regarding work-up.  For now continue with same cardiac regimen.  2.  History of suspected vasospastic angina.  Continue medical therapy.  3.  Mixed hyperlipidemia, she continues on Pravachol with follow-up per Dr. Quintin Alto.  Recent LDL 88.  4.  GERD with recurrent symptoms on Protonix and Zantac.  She plans to follow-up with Dr. Laural Hernandez.  Current medicines were reviewed with the patient today.  Disposition: Follow-up in 6 months.  Signed, Satira Sark, MD, [facility] 02/07/2018 4:12 PM  [facility] [place], [place], [place] [address] Phone: [phone]; Fax: [phone]

## 2018-02-07 ENCOUNTER — Other Ambulatory Visit: Payer: Self-pay

## 2018-02-07 ENCOUNTER — Ambulatory Visit (INDEPENDENT_AMBULATORY_CARE_PROVIDER_SITE_OTHER): Payer: Commercial Managed Care - PPO | Admitting: Cardiology

## 2018-02-07 ENCOUNTER — Encounter: Payer: Self-pay | Admitting: Cardiology

## 2018-02-07 VITALS — BP 144/88 | HR 63 | Ht 63.0 in | Wt 182.0 lb

## 2018-02-07 DIAGNOSIS — K219 Gastro-esophageal reflux disease without esophagitis: Secondary | ICD-10-CM

## 2018-02-07 DIAGNOSIS — Z8679 Personal history of other diseases of the circulatory system: Secondary | ICD-10-CM | POA: Diagnosis not present

## 2018-02-07 DIAGNOSIS — E782 Mixed hyperlipidemia: Secondary | ICD-10-CM | POA: Diagnosis not present

## 2018-02-07 NOTE — Patient Instructions (Signed)

## 2018-02-19 ENCOUNTER — Other Ambulatory Visit: Payer: Self-pay | Admitting: Cardiology

## 2018-02-19 NOTE — Telephone Encounter (Signed)
Follow up     *STAT* If patient is at the pharmacy, call can be transferred to refill team.   1. Which medications need to be refilled? (please list name of each medication and dose if known) amLODipine (NORVASC) 5 MG tablet  2. Which pharmacy/location (including street and city if local pharmacy) is medication to be sent to? [facility] [place] 3. Do they need a 30 day or 90 day supply? [place]

## 2018-03-22 ENCOUNTER — Other Ambulatory Visit: Payer: Self-pay | Admitting: Cardiology

## 2018-03-24 DIAGNOSIS — R1013 Epigastric pain: Secondary | ICD-10-CM | POA: Diagnosis not present

## 2018-04-25 DIAGNOSIS — M1990 Unspecified osteoarthritis, unspecified site: Secondary | ICD-10-CM | POA: Diagnosis not present

## 2018-04-25 DIAGNOSIS — Z7982 Long term (current) use of aspirin: Secondary | ICD-10-CM | POA: Diagnosis not present

## 2018-04-25 DIAGNOSIS — E785 Hyperlipidemia, unspecified: Secondary | ICD-10-CM | POA: Diagnosis not present

## 2018-04-25 DIAGNOSIS — K5900 Constipation, unspecified: Secondary | ICD-10-CM | POA: Diagnosis not present

## 2018-04-25 DIAGNOSIS — I10 Essential (primary) hypertension: Secondary | ICD-10-CM | POA: Diagnosis not present

## 2018-04-25 DIAGNOSIS — Z79899 Other long term (current) drug therapy: Secondary | ICD-10-CM | POA: Diagnosis not present

## 2018-04-25 DIAGNOSIS — M797 Fibromyalgia: Secondary | ICD-10-CM | POA: Diagnosis not present

## 2018-04-25 DIAGNOSIS — R112 Nausea with vomiting, unspecified: Secondary | ICD-10-CM | POA: Diagnosis not present

## 2018-04-25 DIAGNOSIS — Z87891 Personal history of nicotine dependence: Secondary | ICD-10-CM | POA: Diagnosis not present

## 2018-04-25 DIAGNOSIS — K219 Gastro-esophageal reflux disease without esophagitis: Secondary | ICD-10-CM | POA: Diagnosis not present

## 2018-05-24 ENCOUNTER — Other Ambulatory Visit: Payer: Self-pay | Admitting: Cardiology

## 2018-06-03 DIAGNOSIS — R739 Hyperglycemia, unspecified: Secondary | ICD-10-CM | POA: Diagnosis not present

## 2018-06-03 DIAGNOSIS — I10 Essential (primary) hypertension: Secondary | ICD-10-CM | POA: Diagnosis not present

## 2018-06-03 DIAGNOSIS — E782 Mixed hyperlipidemia: Secondary | ICD-10-CM | POA: Diagnosis not present

## 2018-06-04 DIAGNOSIS — K5900 Constipation, unspecified: Secondary | ICD-10-CM | POA: Diagnosis not present

## 2018-06-04 DIAGNOSIS — M542 Cervicalgia: Secondary | ICD-10-CM | POA: Diagnosis not present

## 2018-06-04 DIAGNOSIS — E782 Mixed hyperlipidemia: Secondary | ICD-10-CM | POA: Diagnosis not present

## 2018-06-04 DIAGNOSIS — R1013 Epigastric pain: Secondary | ICD-10-CM | POA: Diagnosis not present

## 2018-06-04 DIAGNOSIS — I10 Essential (primary) hypertension: Secondary | ICD-10-CM | POA: Diagnosis not present

## 2018-06-04 DIAGNOSIS — R739 Hyperglycemia, unspecified: Secondary | ICD-10-CM | POA: Diagnosis not present

## 2018-06-04 DIAGNOSIS — M797 Fibromyalgia: Secondary | ICD-10-CM | POA: Diagnosis not present

## 2018-06-04 DIAGNOSIS — Z6830 Body mass index (BMI) 30.0-30.9, adult: Secondary | ICD-10-CM | POA: Diagnosis not present

## 2018-07-07 DIAGNOSIS — M546 Pain in thoracic spine: Secondary | ICD-10-CM | POA: Diagnosis not present

## 2018-07-07 DIAGNOSIS — M9903 Segmental and somatic dysfunction of lumbar region: Secondary | ICD-10-CM | POA: Diagnosis not present

## 2018-07-07 DIAGNOSIS — M9902 Segmental and somatic dysfunction of thoracic region: Secondary | ICD-10-CM | POA: Diagnosis not present

## 2018-07-07 DIAGNOSIS — S134XXA Sprain of ligaments of cervical spine, initial encounter: Secondary | ICD-10-CM | POA: Diagnosis not present

## 2018-07-07 DIAGNOSIS — M47816 Spondylosis without myelopathy or radiculopathy, lumbar region: Secondary | ICD-10-CM | POA: Diagnosis not present

## 2018-07-07 DIAGNOSIS — M9901 Segmental and somatic dysfunction of cervical region: Secondary | ICD-10-CM | POA: Diagnosis not present

## 2018-07-09 DIAGNOSIS — M9903 Segmental and somatic dysfunction of lumbar region: Secondary | ICD-10-CM | POA: Diagnosis not present

## 2018-07-09 DIAGNOSIS — M9901 Segmental and somatic dysfunction of cervical region: Secondary | ICD-10-CM | POA: Diagnosis not present

## 2018-07-09 DIAGNOSIS — M47816 Spondylosis without myelopathy or radiculopathy, lumbar region: Secondary | ICD-10-CM | POA: Diagnosis not present

## 2018-07-09 DIAGNOSIS — M9902 Segmental and somatic dysfunction of thoracic region: Secondary | ICD-10-CM | POA: Diagnosis not present

## 2018-07-09 DIAGNOSIS — S134XXA Sprain of ligaments of cervical spine, initial encounter: Secondary | ICD-10-CM | POA: Diagnosis not present

## 2018-07-09 DIAGNOSIS — M546 Pain in thoracic spine: Secondary | ICD-10-CM | POA: Diagnosis not present

## 2018-07-14 DIAGNOSIS — M9901 Segmental and somatic dysfunction of cervical region: Secondary | ICD-10-CM | POA: Diagnosis not present

## 2018-07-14 DIAGNOSIS — M47816 Spondylosis without myelopathy or radiculopathy, lumbar region: Secondary | ICD-10-CM | POA: Diagnosis not present

## 2018-07-14 DIAGNOSIS — M9903 Segmental and somatic dysfunction of lumbar region: Secondary | ICD-10-CM | POA: Diagnosis not present

## 2018-07-14 DIAGNOSIS — S134XXA Sprain of ligaments of cervical spine, initial encounter: Secondary | ICD-10-CM | POA: Diagnosis not present

## 2018-07-14 DIAGNOSIS — M546 Pain in thoracic spine: Secondary | ICD-10-CM | POA: Diagnosis not present

## 2018-07-14 DIAGNOSIS — M9902 Segmental and somatic dysfunction of thoracic region: Secondary | ICD-10-CM | POA: Diagnosis not present

## 2018-07-18 DIAGNOSIS — M9903 Segmental and somatic dysfunction of lumbar region: Secondary | ICD-10-CM | POA: Diagnosis not present

## 2018-07-18 DIAGNOSIS — M47816 Spondylosis without myelopathy or radiculopathy, lumbar region: Secondary | ICD-10-CM | POA: Diagnosis not present

## 2018-07-18 DIAGNOSIS — M546 Pain in thoracic spine: Secondary | ICD-10-CM | POA: Diagnosis not present

## 2018-07-18 DIAGNOSIS — M9901 Segmental and somatic dysfunction of cervical region: Secondary | ICD-10-CM | POA: Diagnosis not present

## 2018-07-18 DIAGNOSIS — S134XXA Sprain of ligaments of cervical spine, initial encounter: Secondary | ICD-10-CM | POA: Diagnosis not present

## 2018-07-18 DIAGNOSIS — M9902 Segmental and somatic dysfunction of thoracic region: Secondary | ICD-10-CM | POA: Diagnosis not present

## 2018-07-21 DIAGNOSIS — M9901 Segmental and somatic dysfunction of cervical region: Secondary | ICD-10-CM | POA: Diagnosis not present

## 2018-07-21 DIAGNOSIS — S134XXA Sprain of ligaments of cervical spine, initial encounter: Secondary | ICD-10-CM | POA: Diagnosis not present

## 2018-07-21 DIAGNOSIS — M9902 Segmental and somatic dysfunction of thoracic region: Secondary | ICD-10-CM | POA: Diagnosis not present

## 2018-07-21 DIAGNOSIS — M47816 Spondylosis without myelopathy or radiculopathy, lumbar region: Secondary | ICD-10-CM | POA: Diagnosis not present

## 2018-07-21 DIAGNOSIS — M9903 Segmental and somatic dysfunction of lumbar region: Secondary | ICD-10-CM | POA: Diagnosis not present

## 2018-07-21 DIAGNOSIS — M546 Pain in thoracic spine: Secondary | ICD-10-CM | POA: Diagnosis not present

## 2018-07-30 DIAGNOSIS — M546 Pain in thoracic spine: Secondary | ICD-10-CM | POA: Diagnosis not present

## 2018-07-30 DIAGNOSIS — M47816 Spondylosis without myelopathy or radiculopathy, lumbar region: Secondary | ICD-10-CM | POA: Diagnosis not present

## 2018-07-30 DIAGNOSIS — M9903 Segmental and somatic dysfunction of lumbar region: Secondary | ICD-10-CM | POA: Diagnosis not present

## 2018-07-30 DIAGNOSIS — S134XXA Sprain of ligaments of cervical spine, initial encounter: Secondary | ICD-10-CM | POA: Diagnosis not present

## 2018-07-30 DIAGNOSIS — M9902 Segmental and somatic dysfunction of thoracic region: Secondary | ICD-10-CM | POA: Diagnosis not present

## 2018-07-30 DIAGNOSIS — M9901 Segmental and somatic dysfunction of cervical region: Secondary | ICD-10-CM | POA: Diagnosis not present

## 2018-08-04 ENCOUNTER — Encounter (INDEPENDENT_AMBULATORY_CARE_PROVIDER_SITE_OTHER): Payer: Self-pay | Admitting: *Deleted

## 2018-08-04 DIAGNOSIS — M47816 Spondylosis without myelopathy or radiculopathy, lumbar region: Secondary | ICD-10-CM | POA: Diagnosis not present

## 2018-08-04 DIAGNOSIS — M9901 Segmental and somatic dysfunction of cervical region: Secondary | ICD-10-CM | POA: Diagnosis not present

## 2018-08-04 DIAGNOSIS — S134XXA Sprain of ligaments of cervical spine, initial encounter: Secondary | ICD-10-CM | POA: Diagnosis not present

## 2018-08-04 DIAGNOSIS — M546 Pain in thoracic spine: Secondary | ICD-10-CM | POA: Diagnosis not present

## 2018-08-04 DIAGNOSIS — M9902 Segmental and somatic dysfunction of thoracic region: Secondary | ICD-10-CM | POA: Diagnosis not present

## 2018-08-04 DIAGNOSIS — M9903 Segmental and somatic dysfunction of lumbar region: Secondary | ICD-10-CM | POA: Diagnosis not present

## 2018-08-18 DIAGNOSIS — M47816 Spondylosis without myelopathy or radiculopathy, lumbar region: Secondary | ICD-10-CM | POA: Diagnosis not present

## 2018-08-18 DIAGNOSIS — S134XXA Sprain of ligaments of cervical spine, initial encounter: Secondary | ICD-10-CM | POA: Diagnosis not present

## 2018-08-18 DIAGNOSIS — M546 Pain in thoracic spine: Secondary | ICD-10-CM | POA: Diagnosis not present

## 2018-08-18 DIAGNOSIS — M9901 Segmental and somatic dysfunction of cervical region: Secondary | ICD-10-CM | POA: Diagnosis not present

## 2018-08-18 DIAGNOSIS — M9902 Segmental and somatic dysfunction of thoracic region: Secondary | ICD-10-CM | POA: Diagnosis not present

## 2018-08-18 DIAGNOSIS — M9903 Segmental and somatic dysfunction of lumbar region: Secondary | ICD-10-CM | POA: Diagnosis not present

## 2018-08-21 ENCOUNTER — Telehealth (INDEPENDENT_AMBULATORY_CARE_PROVIDER_SITE_OTHER): Payer: Self-pay | Admitting: *Deleted

## 2018-08-21 ENCOUNTER — Encounter (INDEPENDENT_AMBULATORY_CARE_PROVIDER_SITE_OTHER): Payer: Self-pay | Admitting: *Deleted

## 2018-08-21 MED ORDER — SUPREP BOWEL PREP KIT 17.5-3.13-1.6 GM/177ML PO SOLN: 1.0000 | Freq: Once | ORAL | 0 refills | Status: AC

## 2018-08-21 NOTE — Telephone Encounter (Signed)
Patient needs suprep 

## 2018-08-26 DIAGNOSIS — M9902 Segmental and somatic dysfunction of thoracic region: Secondary | ICD-10-CM | POA: Diagnosis not present

## 2018-08-26 DIAGNOSIS — M546 Pain in thoracic spine: Secondary | ICD-10-CM | POA: Diagnosis not present

## 2018-08-26 DIAGNOSIS — M47816 Spondylosis without myelopathy or radiculopathy, lumbar region: Secondary | ICD-10-CM | POA: Diagnosis not present

## 2018-08-26 DIAGNOSIS — M9901 Segmental and somatic dysfunction of cervical region: Secondary | ICD-10-CM | POA: Diagnosis not present

## 2018-08-26 DIAGNOSIS — S134XXA Sprain of ligaments of cervical spine, initial encounter: Secondary | ICD-10-CM | POA: Diagnosis not present

## 2018-08-26 DIAGNOSIS — M9903 Segmental and somatic dysfunction of lumbar region: Secondary | ICD-10-CM | POA: Diagnosis not present

## 2018-08-28 ENCOUNTER — Other Ambulatory Visit (INDEPENDENT_AMBULATORY_CARE_PROVIDER_SITE_OTHER): Payer: Self-pay | Admitting: *Deleted

## 2018-08-28 DIAGNOSIS — K5900 Constipation, unspecified: Secondary | ICD-10-CM | POA: Insufficient documentation

## 2018-08-28 DIAGNOSIS — Z800 Family history of malignant neoplasm of digestive organs: Secondary | ICD-10-CM

## 2018-09-02 DIAGNOSIS — S134XXA Sprain of ligaments of cervical spine, initial encounter: Secondary | ICD-10-CM | POA: Diagnosis not present

## 2018-09-02 DIAGNOSIS — M9903 Segmental and somatic dysfunction of lumbar region: Secondary | ICD-10-CM | POA: Diagnosis not present

## 2018-09-02 DIAGNOSIS — M47816 Spondylosis without myelopathy or radiculopathy, lumbar region: Secondary | ICD-10-CM | POA: Diagnosis not present

## 2018-09-02 DIAGNOSIS — M9902 Segmental and somatic dysfunction of thoracic region: Secondary | ICD-10-CM | POA: Diagnosis not present

## 2018-09-02 DIAGNOSIS — M546 Pain in thoracic spine: Secondary | ICD-10-CM | POA: Diagnosis not present

## 2018-09-02 DIAGNOSIS — M9901 Segmental and somatic dysfunction of cervical region: Secondary | ICD-10-CM | POA: Diagnosis not present

## 2018-09-10 DIAGNOSIS — M9901 Segmental and somatic dysfunction of cervical region: Secondary | ICD-10-CM | POA: Diagnosis not present

## 2018-09-10 DIAGNOSIS — M47816 Spondylosis without myelopathy or radiculopathy, lumbar region: Secondary | ICD-10-CM | POA: Diagnosis not present

## 2018-09-10 DIAGNOSIS — M546 Pain in thoracic spine: Secondary | ICD-10-CM | POA: Diagnosis not present

## 2018-09-10 DIAGNOSIS — S134XXA Sprain of ligaments of cervical spine, initial encounter: Secondary | ICD-10-CM | POA: Diagnosis not present

## 2018-09-10 DIAGNOSIS — M9902 Segmental and somatic dysfunction of thoracic region: Secondary | ICD-10-CM | POA: Diagnosis not present

## 2018-09-10 DIAGNOSIS — M9903 Segmental and somatic dysfunction of lumbar region: Secondary | ICD-10-CM | POA: Diagnosis not present

## 2018-09-17 DIAGNOSIS — M546 Pain in thoracic spine: Secondary | ICD-10-CM | POA: Diagnosis not present

## 2018-09-17 DIAGNOSIS — M9902 Segmental and somatic dysfunction of thoracic region: Secondary | ICD-10-CM | POA: Diagnosis not present

## 2018-09-17 DIAGNOSIS — S134XXA Sprain of ligaments of cervical spine, initial encounter: Secondary | ICD-10-CM | POA: Diagnosis not present

## 2018-09-17 DIAGNOSIS — M47816 Spondylosis without myelopathy or radiculopathy, lumbar region: Secondary | ICD-10-CM | POA: Diagnosis not present

## 2018-09-17 DIAGNOSIS — M9903 Segmental and somatic dysfunction of lumbar region: Secondary | ICD-10-CM | POA: Diagnosis not present

## 2018-09-17 DIAGNOSIS — M9901 Segmental and somatic dysfunction of cervical region: Secondary | ICD-10-CM | POA: Diagnosis not present

## 2018-09-22 DIAGNOSIS — M9902 Segmental and somatic dysfunction of thoracic region: Secondary | ICD-10-CM | POA: Diagnosis not present

## 2018-09-22 DIAGNOSIS — M9901 Segmental and somatic dysfunction of cervical region: Secondary | ICD-10-CM | POA: Diagnosis not present

## 2018-09-22 DIAGNOSIS — S134XXA Sprain of ligaments of cervical spine, initial encounter: Secondary | ICD-10-CM | POA: Diagnosis not present

## 2018-09-22 DIAGNOSIS — M47816 Spondylosis without myelopathy or radiculopathy, lumbar region: Secondary | ICD-10-CM | POA: Diagnosis not present

## 2018-09-22 DIAGNOSIS — M546 Pain in thoracic spine: Secondary | ICD-10-CM | POA: Diagnosis not present

## 2018-09-22 DIAGNOSIS — M9903 Segmental and somatic dysfunction of lumbar region: Secondary | ICD-10-CM | POA: Diagnosis not present

## 2018-09-29 ENCOUNTER — Telehealth (INDEPENDENT_AMBULATORY_CARE_PROVIDER_SITE_OTHER): Payer: Self-pay | Admitting: *Deleted

## 2018-09-29 NOTE — Telephone Encounter (Signed)
Referring MD/PCP: sasser   Procedure: tcs  Reason/Indication:  Fam hx colon ca, constipation  Has patient had this procedure before?  Yes, 2014  If so, when, by whom and where?    Is there a family history of colon cancer?  Yes, mom  Who?  What age when diagnosed?    Is patient diabetic?   no      Does patient have prosthetic heart valve or mechanical valve?  no  Do you have a pacemaker?  no  Has patient ever had endocarditis? no  Has patient had joint replacement within last 12 months?  no  Is patient constipated or do they take laxatives? no  Does patient have a history of alcohol/drug use?  no  Is patient on blood thinner such as Coumadin, Plavix and/or Aspirin? yes  Medications: pantoprazole 40 mg bid, losartan 25 mg daily, isosorb 30 mg daily, amlodipine 5 mg daily, vit d 1000 daily, mvi daily, pravastatin 20 mg daily, diazepam 20 mg 1.2 tab daily, tylenol 500 mg daily, stool softener daily, benefiber daily, asa 81 mg daily  Allergies: see epic  Medication Adjustment per Dr Lindi Adie, NP: asa 2 days  Procedure date & time: 10/30/18 at 1200

## 2018-10-02 DIAGNOSIS — J209 Acute bronchitis, unspecified: Secondary | ICD-10-CM | POA: Diagnosis not present

## 2018-10-02 DIAGNOSIS — Z6831 Body mass index (BMI) 31.0-31.9, adult: Secondary | ICD-10-CM | POA: Diagnosis not present

## 2018-10-02 NOTE — Telephone Encounter (Signed)
agree

## 2018-10-04 ENCOUNTER — Other Ambulatory Visit: Payer: Self-pay | Admitting: Cardiology

## 2018-10-30 ENCOUNTER — Encounter (HOSPITAL_COMMUNITY): Payer: Self-pay | Admitting: *Deleted

## 2018-10-30 ENCOUNTER — Other Ambulatory Visit: Payer: Self-pay

## 2018-10-30 ENCOUNTER — Encounter (HOSPITAL_COMMUNITY)
Admission: RE | Disposition: A | Discharge status: Home / Self Care | Payer: Self-pay | Source: Home / Self Care | Attending: Internal Medicine

## 2018-10-30 ENCOUNTER — Ambulatory Visit (HOSPITAL_COMMUNITY)
Admission: RE | Admit: 2018-10-30 | Discharge: 2018-10-30 | Disposition: A | Discharge status: Home / Self Care | Payer: Commercial Managed Care - PPO | Attending: Internal Medicine | Admitting: Internal Medicine

## 2018-10-30 DIAGNOSIS — Z800 Family history of malignant neoplasm of digestive organs: Secondary | ICD-10-CM | POA: Diagnosis not present

## 2018-10-30 DIAGNOSIS — K219 Gastro-esophageal reflux disease without esophagitis: Secondary | ICD-10-CM | POA: Diagnosis not present

## 2018-10-30 DIAGNOSIS — Z79899 Other long term (current) drug therapy: Secondary | ICD-10-CM | POA: Diagnosis not present

## 2018-10-30 DIAGNOSIS — Z87891 Personal history of nicotine dependence: Secondary | ICD-10-CM | POA: Insufficient documentation

## 2018-10-30 DIAGNOSIS — E7800 Pure hypercholesterolemia, unspecified: Secondary | ICD-10-CM | POA: Insufficient documentation

## 2018-10-30 DIAGNOSIS — K5909 Other constipation: Secondary | ICD-10-CM | POA: Insufficient documentation

## 2018-10-30 DIAGNOSIS — Z7982 Long term (current) use of aspirin: Secondary | ICD-10-CM | POA: Insufficient documentation

## 2018-10-30 DIAGNOSIS — I2510 Atherosclerotic heart disease of native coronary artery without angina pectoris: Secondary | ICD-10-CM | POA: Diagnosis not present

## 2018-10-30 DIAGNOSIS — K5900 Constipation, unspecified: Secondary | ICD-10-CM

## 2018-10-30 DIAGNOSIS — I10 Essential (primary) hypertension: Secondary | ICD-10-CM | POA: Insufficient documentation

## 2018-10-30 DIAGNOSIS — K5730 Diverticulosis of large intestine without perforation or abscess without bleeding: Secondary | ICD-10-CM | POA: Insufficient documentation

## 2018-10-30 DIAGNOSIS — Z1211 Encounter for screening for malignant neoplasm of colon: Secondary | ICD-10-CM | POA: Insufficient documentation

## 2018-10-30 DIAGNOSIS — F419 Anxiety disorder, unspecified: Secondary | ICD-10-CM | POA: Insufficient documentation

## 2018-10-30 DIAGNOSIS — K6389 Other specified diseases of intestine: Secondary | ICD-10-CM | POA: Insufficient documentation

## 2018-10-30 DIAGNOSIS — Q439 Congenital malformation of intestine, unspecified: Secondary | ICD-10-CM | POA: Diagnosis not present

## 2018-10-30 DIAGNOSIS — I252 Old myocardial infarction: Secondary | ICD-10-CM | POA: Insufficient documentation

## 2018-10-30 HISTORY — DX: Acute myocardial infarction, unspecified: I21.9

## 2018-10-30 HISTORY — PX: COLONOSCOPY: SHX5424

## 2018-10-30 SURGERY — COLONOSCOPY
Anesthesia: Moderate Sedation

## 2018-10-30 MED ORDER — STERILE WATER FOR IRRIGATION IR SOLN
Status: DC | PRN
  Administered 2018-10-30: 2.5 mL

## 2018-10-30 MED ORDER — FENTANYL CITRATE (PF) 100 MCG/2ML IJ SOLN
INTRAMUSCULAR | Status: AC
  Filled 2018-10-30: qty 2

## 2018-10-30 MED ORDER — MIDAZOLAM HCL 5 MG/5ML IJ SOLN
INTRAMUSCULAR | Status: AC
  Filled 2018-10-30: qty 10

## 2018-10-30 MED ORDER — FENTANYL CITRATE (PF) 100 MCG/2ML IJ SOLN
INTRAMUSCULAR | Status: DC | PRN
  Administered 2018-10-30 (×4): 25 ug via INTRAVENOUS

## 2018-10-30 MED ORDER — POLYETHYLENE GLYCOL 3350 17 GM/SCOOP PO POWD: 8.5000 g | Freq: Every day | ORAL | 0 refills | Status: DC

## 2018-10-30 MED ORDER — SODIUM CHLORIDE 0.9 % IV SOLN
INTRAVENOUS | Status: DC
  Administered 2018-10-30: 1000 mL via INTRAVENOUS

## 2018-10-30 MED ORDER — MIDAZOLAM HCL 5 MG/5ML IJ SOLN
INTRAMUSCULAR | Status: DC | PRN
  Administered 2018-10-30 (×3): 2 mg via INTRAVENOUS
  Administered 2018-10-30 (×2): 1 mg via INTRAVENOUS
  Administered 2018-10-30 (×2): 2 mg via INTRAVENOUS
  Administered 2018-10-30: 3 mg via INTRAVENOUS

## 2018-10-30 MED ORDER — MIDAZOLAM HCL 5 MG/5ML IJ SOLN
INTRAMUSCULAR | Status: AC
  Filled 2018-10-30: qty 5

## 2018-10-30 NOTE — H&P (Signed)
Jenna Hernandez is an 68 y.o. female.   Chief Complaint: Patient is here for colonoscopy. HPI: Patient is 68 year old Caucasian female who is here for screening colonoscopy.  She has chronic constipation.  She is trying to control her constipation and watching her diet and taking stool softener.  She denies abdominal pain melena or rectal bleeding.  She had a colonoscopy in May 2009 and second 1 in 2014 did not have any polyps. History significant for CRC in her mother who was in her late 78s when she had her first primary.  She had second primary when she was in her late 62s.  She died of metastatic disease last year.  She also has a first cousin on mother side who had colon carcinoma diagnosed on her first colonoscopy at 29 and died within few weeks. His aspirin dose was yesterday.  Past Medical History:  Diagnosis Date  . Antral gastritis    a. EGD 06/2015  . Anxiety   . CAD (coronary artery disease)    a. NSTEMI 12/2011 - cath smooth/normal cors, ? vasospasm with thrombosis given HK in inferior wall - readmitted same month - MD reviewed films and felt she may have had small branch occlusion off RCA accounting for WMA. b. Subsequent workups in 2014, 2015 normal. c. Readmitted 06/2015 with CP/+ troponin - cath showed no significant CAD, LVEF 55-65%  . Chronic back pain   . Coronary vasospasm ([place])   . Diverticulosis   . Essential hypertension   . Fibromyalgia   . GERD (gastroesophageal reflux disease)   . H/O hiatal hernia   . Hypercholesterolemia   . Myocardial infarction ([place])   . Obesity   . Osteoarthritis   . PUD (peptic ulcer disease)    a. EGD 06/2015  . Pulmonary nodules    a. Incidentally noted on CT by GI 06/2015    Past Surgical History:  Procedure Laterality Date  . CARDIAC CATHETERIZATION  12/2011  . CARDIAC CATHETERIZATION N/A 06/09/2015   Procedure: Left Heart Cath and Coronary Angiography;  Surgeon: Jettie Booze, MD;  Location: [place] [facility];  Service:  Cardiovascular;  Laterality: N/A;  . COLONOSCOPY  08/2013   [facility] /Dr.Benson  . CYSTECTOMY  ?1990's   "from my back"  . ESOPHAGOGASTRODUODENOSCOPY N/A 06/10/2015   Procedure: ESOPHAGOGASTRODUODENOSCOPY (EGD);  Surgeon: Rogene Houston, MD;  [facility];  Service: Endoscopy;  Laterality: N/A;  140  . INGUINAL HERNIA REPAIR Right ~ 1983  . LAPAROSCOPIC CHOLECYSTECTOMY  ~ 2004  . LEFT HEART CATHETERIZATION WITH CORONARY ANGIOGRAM N/A 12/13/2011   Procedure: LEFT HEART CATHETERIZATION WITH CORONARY ANGIOGRAM;  Surgeon: Thayer Headings, MD;  Location: [facility] CATH LAB;  Service: Cardiovascular;  Laterality: N/A;  . TUBAL LIGATION  1979  . UPPER GASTROINTESTINAL ENDOSCOPY  09/2012   [facility]/Dr.Benson  . VAGINAL HYSTERECTOMY  1990    Family History  Problem Relation Age of Onset  . Coronary artery disease Father 62  . Heart attack Father 68  . Diabetes Father   . Irregular heart beat Mother   . Bladder Cancer Mother   . Colon cancer Mother   . Fibromyalgia Mother   . Osteoarthritis Mother   . Osteoporosis Mother   . Diabetes Sister    Social History:  reports that she quit smoking about 6 years ago. Her smoking use included cigarettes. She has a 11.50 pack-year smoking history. She has never used smokeless tobacco. She reports that she does not drink alcohol or use  drugs.  Allergies:  Allergies  Allergen Reactions  . Diclofenac Sodium Anaphylaxis    Tolerates aspirin - was told not to take antiinflammatory meds.  . Nsaids Anaphylaxis  . Meperidine-Promethazine Other (See Comments)    headache  . Ramipril Other (See Comments)    cough  . Raspberry Hives  . Codeine Rash  . Prednisone Palpitations  . Sulfa Antibiotics Rash    Medications Prior to Admission  Medication Sig Dispense Refill  . acetaminophen (TYLENOL) 500 MG tablet Take 1,000 mg by mouth See admin instructions. Take 1000 mg at bedtime, may take an additional 1000 mg dose daily as needed for pain    . amLODipine  (NORVASC) 5 MG tablet TAKE 1 TABLET BY MOUTH ONCE DAILY 90 tablet 3  . aspirin EC 81 MG tablet Take 81 mg by mouth at bedtime.    . diazepam (VALIUM) 2 MG tablet Take 1 mg by mouth See admin instructions. Take 1 mg at bedtime, may take an additional 1 mg dose daily as needed for anxiety    . docusate sodium (COLACE) 50 MG capsule Take 50 mg by mouth daily.    . isosorbide mononitrate (IMDUR) 30 MG 24 hr tablet TAKE 1 TABLET BY MOUTH ONCE DAILY 90 tablet 1  . losartan (COZAAR) 25 MG tablet TAKE 1 TABLET BY MOUTH ONCE DAILY 90 tablet 2  . Multiple Vitamin (MULTIVITAMIN) tablet Take 1 tablet by mouth daily.    . Omega-3 Fatty Acids (FISH OIL) 1000 MG CAPS Take 2,000 mg by mouth daily.    . pantoprazole (PROTONIX) 40 MG tablet Take 40 mg by mouth 2 (two) times daily.     Marland Kitchen Peppermint Oil (IBGARD PO) Take 1 tablet by mouth daily as needed (ibs symptoms).    . pravastatin (PRAVACHOL) 20 MG tablet TAKE 1 TABLET BY MOUTH ONCE DAILY (Patient taking differently: Take 20 mg by mouth at bedtime. ) 30 tablet 0  . Probiotic Product (PROBIOTIC-10) CHEW Chew 2 tablets by mouth daily.    . Wheat Dextrin (BENEFIBER PO) Take 1 packet by mouth daily as needed (constipation).     . nitroGLYCERIN (NITROSTAT) 0.4 MG SL tablet Place 1 tablet (0.4 mg total) under the tongue every 5 (five) minutes as needed for chest pain (up to 3 doses). 25 tablet 3    No results found for this or any previous visit (from the past 48 hour(s)). No results found.  ROS  Blood pressure 126/72, pulse 82, temperature 98.6 F (37 C), temperature source Oral, resp. rate (!) 22, height 5' 3.25" (1.607 m), weight 77.1 kg, SpO2 100 %. Physical Exam  Constitutional: She appears well-developed and well-nourished.  HENT:  Mouth/Throat: Oropharynx is clear and moist.  Eyes: Conjunctivae are normal. No scleral icterus.  Neck: No thyromegaly present.  Cardiovascular: Normal rate, regular rhythm and normal heart sounds.  No murmur  heard. Respiratory: Effort normal and breath sounds normal.  GI: Soft. She exhibits no distension and no mass. There is no abdominal tenderness.  Musculoskeletal:        General: No edema.  Lymphadenopathy:    She has no cervical adenopathy.  Neurological: She is alert.  Skin: Skin is warm and dry.     Assessment/Plan High risk screening colonoscopy.  Hildred Laser, MD 10/30/2018, 11:33 AM

## 2018-10-30 NOTE — Op Note (Signed)
[facility] Patient Name: Jenna Hernandez Procedure Date: 10/30/2018 11:07 AM MRN: [id number] Date of Birth: 1951/03/18 Attending MD: Hildred Laser , MD CSN: [id number] Age: 68 Admit Type: Outpatient Procedure:                Colonoscopy Indications:              Screening in patient at increased risk: Colorectal                            cancer in mother before age 1 Providers:                Hildred Laser, MD, Gwynneth Albright RN, RN,                            Gerome Sam, RN, Randa Spike, Technician Referring MD:             Manon Hilding, MD Medicines:                Fentanyl 100 micrograms IV, Midazolam 15 mg IV Complications:            No immediate complications. Estimated Blood Loss:     Estimated blood loss: none. Procedure:                Pre-Anesthesia Assessment:                           - Prior to the procedure, a History and Physical                            was performed, and patient medications and                            allergies were reviewed. The patient's tolerance of                            previous anesthesia was also reviewed. The risks                            and benefits of the procedure and the sedation                            options and risks were discussed with the patient.                            All questions were answered, and informed consent                            was obtained. Prior Anticoagulants: The patient                            last took aspirin 1 day prior to the procedure. ASA                            Grade Assessment: III - A patient with severe  systemic disease. After reviewing the risks and                            benefits, the patient was deemed in satisfactory                            condition to undergo the procedure.                           After obtaining informed consent, the colonoscope                            was passed under direct vision.  Throughout the                            procedure, the patient's blood pressure, pulse, and                            oxygen saturations were monitored continuously. The                            [id number]-[id number] ([id number]) scope was introduced through                            the anus and advanced to the the cecum, identified                            by appendiceal orifice and ileocecal valve. The                            colonoscopy was technically difficult and complex                            due to restricted mobility of the sigmoid colon and                            a tortuous proximal colon. Successful completion of                            the procedure was aided by increasing the dose of                            sedation medication, changing the patient to a                            supine position and withdrawing the scope and                            replacing with the 'babyscope'. The patient                            tolerated the procedure well. The quality of the  bowel preparation was excellent. The ileocecal                            valve, appendiceal orifice, and rectum were                            photographed. Scope In: 16:10:96 AM Scope Out: 12:40:52 PM Scope Withdrawal Time: 0 hours 6 minutes 3 seconds  Total Procedure Duration: 0 hours 53 minutes 58 seconds  Findings:      The perianal and digital rectal examinations were normal.      A diffuse area of mild melanosis was found in the entire colon.      Scattered small medium-mouthed diverticula were found in the sigmoid       colon and hepatic flexure.      The exam was otherwise normal throughout the examined colon.      The retroflexed view of the distal rectum and anal verge was normal and       showed no anal or rectal abnormalities. Impression:               - Melanosis in the colon.                           - Diverticulosis in the sigmoid colon and at the                             hepatic flexure.                           - No specimens collected. Moderate Sedation:      Moderate (conscious) sedation was administered by the endoscopy nurse       and supervised by the endoscopist. The following parameters were       monitored: oxygen saturation, heart rate, blood pressure, CO2       capnography and response to care. Total physician intraservice time was       61 minutes. Recommendation:           - Patient has a contact number available for                            emergencies. The signs and symptoms of potential                            delayed complications were discussed with the                            patient. Return to normal activities tomorrow.                            Written discharge instructions were provided to the                            patient.                           - High fiber diet today.                           -  Continue present medications.                           - Repeat colonoscopy in 5 years for screening                            purposes with propofol. Procedure Code(s):        --- Professional ---                           [phone], Colonoscopy, flexible; diagnostic, including                            collection of specimen(s) by brushing or washing,                            when performed (separate procedure)                           99153, Moderate sedation; each additional 15                            minutes intraservice time                           99153, Moderate sedation; each additional 15                            minutes intraservice time                           99153, Moderate sedation; each additional 15                            minutes intraservice time                           G0500, Moderate sedation services provided by the                            same physician or other qualified health care                            professional performing a gastrointestinal                             endoscopic service that sedation supports,                            requiring the presence of an independent trained                            observer to assist in the monitoring of the                            patient's level of consciousness and physiological  status; initial 15 minutes of intra-service time;                            patient age 79 years or older (additional time may                            be reported with [phone], as appropriate) Diagnosis Code(s):        --- Professional ---                           Z80.0, Family history of malignant neoplasm of                            digestive organs                           K63.89, Other specified diseases of intestine                           K57.30, Diverticulosis of large intestine without                            perforation or abscess without bleeding CPT copyright 2018 [employer]. All rights reserved. The codes documented in this report are preliminary and upon coder review may  be revised to meet current compliance requirements. Hildred Laser, MD Hildred Laser, MD 10/30/2018 12:49:19 PM This report has been signed electronically. Number of Addenda: 0

## 2018-10-30 NOTE — Discharge Instructions (Signed)
Resume usual medications as before. Polyethylene glycol 8.5 g daily or every other day. High-fiber diet. No driving for 24 hours. Next colonoscopy in 5 years with propofol.   Colonoscopy, Adult, Care After This sheet gives you information about how to care for yourself after your procedure. Your doctor may also give you more specific instructions. If you have problems or questions, call your doctor. What can I expect after the procedure? After the procedure, it is common to have:  A small amount of blood in your poop for 24 hours.  Some gas.  Mild cramping or bloating in your belly. Follow these instructions at home: General instructions  For the first 24 hours after the procedure: ? Do not drive or use machinery. ? Do not sign important documents. ? Do not drink alcohol. ? Do your daily activities more slowly than normal. ? Eat foods that are soft and easy to digest.  Take over-the-counter or prescription medicines only as told by your doctor. To help cramping and bloating:   Try walking around.  Put heat on your belly (abdomen) as told by your doctor. Use a heat source that your doctor recommends, such as a moist heat pack or a heating pad. ? Put a towel between your skin and the heat source. ? Leave the heat on for 20-30 minutes. ? Remove the heat if your skin turns bright red. This is especially important if you cannot feel pain, heat, or cold. You can get burned. Eating and drinking   Drink enough fluid to keep your pee (urine) clear or pale yellow.  Return to your normal diet as told by your doctor. Avoid heavy or fried foods that are hard to digest.  Avoid drinking alcohol for as long as told by your doctor. Contact a doctor if:  You have blood in your poop (stool) 2-3 days after the procedure. Get help right away if:  You have more than a small amount of blood in your poop.  You see large clumps of tissue (blood clots) in your poop.  Your belly is  swollen.  You feel sick to your stomach (nauseous).  You throw up (vomit).  You have a fever.  You have belly pain that gets worse, and medicine does not help your pain. Summary  After the procedure, it is common to have a small amount of blood in your poop. You may also have mild cramping and bloating in your belly.  For the first 24 hours after the procedure, do not drive or use machinery, do not sign important documents, and do not drink alcohol.  Get help right away if you have a lot of blood in your poop, feel sick to your stomach, have a fever, or have more belly pain. This information is not intended to replace advice given to you by your health care provider. Make sure you discuss any questions you have with your health care provider. Document Released: 10/27/2010 Document Revised: 07/25/2017 Document Reviewed: 06/18/2016 Elsevier Interactive Patient Education  2019 [employer].   Diverticulosis  Diverticulosis is a condition that develops when small pouches (diverticula) form in the wall of the large intestine (colon). The colon is where water is absorbed and stool is formed. The pouches form when the inside layer of the colon pushes through weak spots in the outer layers of the colon. You may have a few pouches or many of them. What are the causes? The cause of this condition is not known. What increases the risk? The  following factors may make you more likely to develop this condition:  Being older than age 29. Your risk for this condition increases with age. Diverticulosis is rare among people younger than age 39. By age 12, many people have it.  Eating a low-fiber diet.  Having frequent constipation.  Being overweight.  Not getting enough exercise.  Smoking.  Taking over-the-counter pain medicines, like aspirin and ibuprofen.  Having a family history of diverticulosis. What are the signs or symptoms? In most people, there are no symptoms of this condition.  If you do have symptoms, they may include:  Bloating.  Cramps in the abdomen.  Constipation or diarrhea.  Pain in the lower left side of the abdomen. How is this diagnosed? This condition is most often diagnosed during an exam for other colon problems. Because diverticulosis usually has no symptoms, it often cannot be diagnosed independently. This condition may be diagnosed by:  Using a flexible scope to examine the colon (colonoscopy).  Taking an X-ray of the colon after dye has been put into the colon (barium enema).  Doing a CT scan. How is this treated? You may not need treatment for this condition if you have never developed an infection related to diverticulosis. If you have had an infection before, treatment may include:  Eating a high-fiber diet. This may include eating more fruits, vegetables, and grains.  Taking a fiber supplement.  Taking a live bacteria supplement (probiotic).  Taking medicine to relax your colon.  Taking antibiotic medicines. Follow these instructions at home:  Drink 6-8 glasses of water or more each day to prevent constipation.  Try not to strain when you have a bowel movement.  If you have had an infection before: ? Eat more fiber as directed by your health care provider or your diet and nutrition specialist (dietitian). ? Take a fiber supplement or probiotic, if your health care provider approves.  Take over-the-counter and prescription medicines only as told by your health care provider.  If you were prescribed an antibiotic, take it as told by your health care provider. Do not stop taking the antibiotic even if you start to feel better.  Keep all follow-up visits as told by your health care provider. This is important. Contact a health care provider if:  You have pain in your abdomen.  You have bloating.  You have cramps.  You have not had a bowel movement in 3 days. Get help right away if:  Your pain gets worse.  Your  bloating becomes very bad.  You have a fever or chills, and your symptoms suddenly get worse.  You vomit.  You have bowel movements that are bloody or black.  You have bleeding from your rectum. Summary  Diverticulosis is a condition that develops when small pouches (diverticula) form in the wall of the large intestine (colon).  You may have a few pouches or many of them.  This condition is most often diagnosed during an exam for other colon problems.  If you have had an infection related to diverticulosis, treatment may include increasing the fiber in your diet, taking supplements, or taking medicines. This information is not intended to replace advice given to you by your health care provider. Make sure you discuss any questions you have with your health care provider. Document Released: 06/21/2004 Document Revised: 08/13/2016 Document Reviewed: 08/13/2016 Elsevier Interactive Patient Education  2019 [employer] POST-ANESTHESIA  IMMEDIATELY FOLLOWING SURGERY:  Do not drive or operate machinery for the first  twenty four hours after surgery.  Do not make any important decisions for twenty four hours after surgery or while taking narcotic pain medications or sedatives.  If you develop intractable nausea and vomiting or a severe headache please notify your doctor immediately.  FOLLOW-UP:  Please make an appointment with your surgeon as instructed. You do not need to follow up with anesthesia unless specifically instructed to do so.  WOUND CARE INSTRUCTIONS (if applicable):  Keep a dry clean dressing on the anesthesia/puncture wound site if there is drainage.  Once the wound has quit draining you may leave it open to air.  Generally you should leave the bandage intact for twenty four hours unless there is drainage.  If the epidural site drains for more than 36-48 hours please call the anesthesia department.  QUESTIONS?:  Please feel free to call your physician or  the hospital operator if you have any questions, and they will be happy to assist you.

## 2018-11-02 ENCOUNTER — Other Ambulatory Visit: Payer: Self-pay | Admitting: Cardiology

## 2018-11-03 ENCOUNTER — Encounter (HOSPITAL_COMMUNITY): Payer: Self-pay | Admitting: Internal Medicine

## 2018-11-14 DIAGNOSIS — M25511 Pain in right shoulder: Secondary | ICD-10-CM | POA: Diagnosis not present

## 2018-11-14 DIAGNOSIS — M1712 Unilateral primary osteoarthritis, left knee: Secondary | ICD-10-CM | POA: Diagnosis not present

## 2018-11-14 DIAGNOSIS — M25562 Pain in left knee: Secondary | ICD-10-CM | POA: Diagnosis not present

## 2018-11-21 DIAGNOSIS — M25562 Pain in left knee: Secondary | ICD-10-CM | POA: Diagnosis not present

## 2018-11-21 DIAGNOSIS — M25511 Pain in right shoulder: Secondary | ICD-10-CM | POA: Diagnosis not present

## 2018-11-27 ENCOUNTER — Telehealth (INDEPENDENT_AMBULATORY_CARE_PROVIDER_SITE_OTHER): Payer: Self-pay | Admitting: Internal Medicine

## 2018-11-27 NOTE — Telephone Encounter (Signed)
The extension that u listed is not correct

## 2018-11-27 NOTE — Telephone Encounter (Signed)
Patient called stated she is having a lot of acid reflux - her medication doesn't seem to be working - please call her at [phone] ext [phone]

## 2018-11-30 ENCOUNTER — Other Ambulatory Visit: Payer: Self-pay | Admitting: Cardiology

## 2018-12-03 DIAGNOSIS — R5383 Other fatigue: Secondary | ICD-10-CM | POA: Diagnosis not present

## 2018-12-03 DIAGNOSIS — E782 Mixed hyperlipidemia: Secondary | ICD-10-CM | POA: Diagnosis not present

## 2018-12-03 DIAGNOSIS — I10 Essential (primary) hypertension: Secondary | ICD-10-CM | POA: Diagnosis not present

## 2018-12-04 DIAGNOSIS — Z1331 Encounter for screening for depression: Secondary | ICD-10-CM | POA: Diagnosis not present

## 2018-12-04 DIAGNOSIS — F411 Generalized anxiety disorder: Secondary | ICD-10-CM | POA: Diagnosis not present

## 2018-12-04 DIAGNOSIS — I10 Essential (primary) hypertension: Secondary | ICD-10-CM | POA: Diagnosis not present

## 2018-12-04 DIAGNOSIS — E782 Mixed hyperlipidemia: Secondary | ICD-10-CM | POA: Diagnosis not present

## 2018-12-04 DIAGNOSIS — R739 Hyperglycemia, unspecified: Secondary | ICD-10-CM | POA: Diagnosis not present

## 2018-12-04 DIAGNOSIS — Z6830 Body mass index (BMI) 30.0-30.9, adult: Secondary | ICD-10-CM | POA: Diagnosis not present

## 2018-12-04 DIAGNOSIS — K5900 Constipation, unspecified: Secondary | ICD-10-CM | POA: Diagnosis not present

## 2018-12-04 DIAGNOSIS — Z1389 Encounter for screening for other disorder: Secondary | ICD-10-CM | POA: Diagnosis not present

## 2018-12-18 NOTE — Progress Notes (Signed)
Cardiology Office Note  Date: 12/19/2018   ID: Jenna Hernandez, DOB 1951/09/03, MRN [id number]  PCP: Manon Hilding, MD  Primary Cardiologist: Rozann Lesches, MD   Chief Complaint  Patient presents with  . Cardiac follow-up     History of Present Illness: Jenna Hernandez is a 68 y.o. female last seen in May 2019.  She is here for a routine visit.  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  She reports occasional palpitations but no recurring chest pain.  She had been exercising at [employer] but hurt her left knee and had a stop.  She was working on weight loss.  I reviewed her medications which are outlined below.  Cardiac regimen includes aspirin, Norvasc, Imdur, Cozaar, and Pravachol.  Lipids are followed by Dr. Quintin Alto.  We are requesting her most recent lab work.  I personally reviewed her ECG today which shows sinus rhythm with small R' in lead V1.  Past Medical History:  Diagnosis Date  . Antral gastritis    a. EGD 06/2015  . Anxiety   . CAD (coronary artery disease)    a. NSTEMI 12/2011 - cath smooth/normal cors, ? vasospasm with thrombosis given HK in inferior wall - readmitted same month - MD reviewed films and felt she may have had small branch occlusion off RCA accounting for WMA. b. Subsequent workups in 2014, 2015 normal. c. Readmitted 06/2015 with CP/+ troponin - cath showed no significant CAD, LVEF 55-65%  . Chronic back pain   . Coronary vasospasm ([place])   . Diverticulosis   . Essential hypertension   . Fibromyalgia   . GERD (gastroesophageal reflux disease)   . H/O hiatal hernia   . Hypercholesterolemia   . Myocardial infarction ([place])   . Obesity   . Osteoarthritis   . PUD (peptic ulcer disease)    a. EGD 06/2015  . Pulmonary nodules    a. Incidentally noted on CT by GI 06/2015    Past Surgical History:  Procedure Laterality Date  . CARDIAC  CATHETERIZATION  12/2011  . CARDIAC CATHETERIZATION N/A 06/09/2015   Procedure: Left Heart Cath and Coronary Angiography;  Surgeon: Jettie Booze, MD;  Location: [place] CV LAB;  Service: Cardiovascular;  Laterality: N/A;  . COLONOSCOPY  08/2013   [facility] /Dr.Benson  . COLONOSCOPY N/A 10/30/2018   Procedure: COLONOSCOPY;  Surgeon: Rogene Houston, MD;  [facility];  Service: Endoscopy;  Laterality: N/A;  1200  . CYSTECTOMY  ?1990's   "from my back"  . ESOPHAGOGASTRODUODENOSCOPY N/A 06/10/2015   Procedure: ESOPHAGOGASTRODUODENOSCOPY (EGD);  Surgeon: Rogene Houston, MD;  [facility];  Service: Endoscopy;  Laterality: N/A;  140  . INGUINAL HERNIA REPAIR Right ~ 1983  . LAPAROSCOPIC CHOLECYSTECTOMY  ~ 2004  . LEFT HEART CATHETERIZATION WITH CORONARY ANGIOGRAM N/A 12/13/2011   Procedure: LEFT HEART CATHETERIZATION WITH CORONARY ANGIOGRAM;  Surgeon: Thayer Headings, MD;  [facility]: [facility] CATH LAB;  Service: Cardiovascular;  Laterality: N/A;  . TUBAL LIGATION  1979  . UPPER GASTROINTESTINAL ENDOSCOPY  09/2012   [facility]/Dr.Benson  . VAGINAL HYSTERECTOMY  1990    Current Outpatient Medications  Medication Sig Dispense Refill  . acetaminophen (TYLENOL) 500 MG tablet Take 1,000 mg by mouth See admin instructions. Take 1000 mg at bedtime, may take an additional 1000 mg dose daily as needed for pain    .  amLODipine (NORVASC) 5 MG tablet TAKE 1 TABLET BY MOUTH ONCE DAILY 90 tablet 3  . aspirin EC 81 MG tablet Take 81 mg by mouth at bedtime.    . diazepam (VALIUM) 2 MG tablet Take 1 mg by mouth See admin instructions. Take 1 mg at bedtime, may take an additional 1 mg dose daily as needed for anxiety    . docusate sodium (COLACE) 50 MG capsule Take 50 mg by mouth daily.    . isosorbide mononitrate (IMDUR) 30 MG 24 hr tablet TAKE 1 TABLET BY MOUTH ONCE DAILY 90 tablet 0  . losartan (COZAAR) 25 MG tablet TAKE 1 TABLET BY MOUTH ONCE DAILY 90 tablet 2  . Multiple Vitamin (MULTIVITAMIN)  tablet Take 1 tablet by mouth daily.    . nitroGLYCERIN (NITROSTAT) 0.4 MG SL tablet Place 1 tablet (0.4 mg total) under the tongue every 5 (five) minutes as needed for chest pain (up to 3 doses). 25 tablet 3  . Omega-3 Fatty Acids (FISH OIL) 1000 MG CAPS Take 2,000 mg by mouth daily.    . pantoprazole (PROTONIX) 40 MG tablet Take 40 mg by mouth 2 (two) times daily.     Marland Kitchen Peppermint Oil (IBGARD PO) Take 1 tablet by mouth daily as needed (ibs symptoms).    . polyethylene glycol powder (GLYCOLAX/MIRALAX) powder Take 8.5 g by mouth daily. 255 g 0  . pravastatin (PRAVACHOL) 20 MG tablet TAKE 1 TABLET BY MOUTH ONCE DAILY NEEDS  OFFICE  VISIT 30 tablet 0  . Probiotic Product (PROBIOTIC-10) CHEW Chew 2 tablets by mouth daily.    . Wheat Dextrin (BENEFIBER PO) Take 1 packet by mouth daily as needed (constipation).      No current facility-administered medications for this visit.    Allergies:  Diclofenac sodium; Nsaids; Meperidine-promethazine; Ramipril; Raspberry; Codeine; Prednisone; and Sulfa antibiotics   Social History: The patient  reports that she quit smoking about 7 years ago. Her smoking use included cigarettes. She has a 11.50 pack-year smoking history. She has never used smokeless tobacco. She reports that she does not drink alcohol or use drugs.   ROS:  Please see the history of present illness. Otherwise, complete review of systems is positive for situational stress.  All other systems are reviewed and negative.   Physical Exam: VS:  BP 107/73   Pulse 73   Ht 5\' 3"  (1.6 m)   Wt 180 lb (81.6 kg)   SpO2 95%   BMI 31.89 kg/m , BMI Body mass index is 31.89 kg/m.  Wt Readings from Last 3 Encounters:  12/19/18 180 lb (81.6 kg)  10/30/18 170 lb (77.1 kg)  02/07/18 182 lb (82.6 kg)    General: Patient appears comfortable at rest. HEENT: Conjunctiva and lids normal, oropharynx clear. Neck: Supple, no elevated JVP or carotid bruits, no thyromegaly. Lungs: Clear to auscultation,  nonlabored breathing at rest. Cardiac: Regular rate and rhythm, no S3 or significant systolic murmur. Abdomen: Soft, nontender, bowel sounds present. Extremities: No pitting edema, distal pulses 2+. Skin: Warm and dry. Musculoskeletal: No kyphosis. Neuropsychiatric: Alert and oriented x3, affect grossly appropriate.  ECG: I personally reviewed the tracing from 01/29/2018 which showed sinus rhythm with left atrial enlargement, nonspecific T wave changes.  Recent Labwork:  April 2019: BUN 20, creatinine 0.9, potassium 4.0, magnesium 2.0, troponin T negative x3, hemoglobin 13.6, platelets 285  Other Studies Reviewed Today:  Cardiac catheterization9/10/2014:  The left ventricular systolic function is normal.  No significant CAD.  Assessment and Plan:  1.  History of suspected coronary vasospasm.  She is clinically stable on medical therapy including Norvasc and Imdur.  I reviewed her ECG.  We will continue with observation.  I encouraged regular exercise.  2.  Mixed hyperlipidemia.  On Pravachol.  Requesting interval lab work from Dr. Quintin Alto.  3.  GERD, on Protonix.  She follows with Dr. Laural Golden.  Current medicines were reviewed with the patient today.   Orders Placed This Encounter  Procedures  . EKG 12-Lead    Disposition: Follow-up in 1 year.  Signed, Satira Sark, MD, [facility] 12/19/2018 10:07 AM    [facility] [place], [place], [place] [address] Phone: [phone]; Fax: [phone]

## 2018-12-19 ENCOUNTER — Encounter: Payer: Self-pay | Admitting: *Deleted

## 2018-12-19 ENCOUNTER — Ambulatory Visit (INDEPENDENT_AMBULATORY_CARE_PROVIDER_SITE_OTHER): Payer: Commercial Managed Care - PPO | Admitting: Cardiology

## 2018-12-19 ENCOUNTER — Other Ambulatory Visit: Payer: Self-pay

## 2018-12-19 ENCOUNTER — Encounter: Payer: Self-pay | Admitting: Cardiology

## 2018-12-19 VITALS — BP 107/73 | HR 73 | Ht 63.0 in | Wt 180.0 lb

## 2018-12-19 DIAGNOSIS — I10 Essential (primary) hypertension: Secondary | ICD-10-CM

## 2018-12-19 DIAGNOSIS — Z8679 Personal history of other diseases of the circulatory system: Secondary | ICD-10-CM

## 2018-12-19 DIAGNOSIS — K219 Gastro-esophageal reflux disease without esophagitis: Secondary | ICD-10-CM | POA: Diagnosis not present

## 2018-12-19 NOTE — Patient Instructions (Addendum)

## 2018-12-29 ENCOUNTER — Other Ambulatory Visit: Payer: Self-pay | Admitting: Cardiology

## 2019-02-06 ENCOUNTER — Other Ambulatory Visit: Payer: Self-pay | Admitting: Cardiology

## 2019-03-04 ENCOUNTER — Other Ambulatory Visit: Payer: Self-pay | Admitting: Cardiology

## 2019-03-06 ENCOUNTER — Other Ambulatory Visit: Payer: Self-pay | Admitting: Cardiology

## 2019-04-04 ENCOUNTER — Other Ambulatory Visit: Payer: Self-pay | Admitting: Cardiology

## 2019-04-06 ENCOUNTER — Other Ambulatory Visit: Payer: Self-pay | Admitting: *Deleted

## 2019-04-06 MED ORDER — AMLODIPINE BESYLATE 5 MG PO TABS: 5.0000 mg | ORAL_TABLET | Freq: Every day | ORAL | 2 refills | Status: DC

## 2019-05-12 DIAGNOSIS — M7550 Bursitis of unspecified shoulder: Secondary | ICD-10-CM | POA: Diagnosis not present

## 2019-05-12 DIAGNOSIS — M797 Fibromyalgia: Secondary | ICD-10-CM | POA: Diagnosis not present

## 2019-05-20 DIAGNOSIS — J0101 Acute recurrent maxillary sinusitis: Secondary | ICD-10-CM | POA: Diagnosis not present

## 2019-05-28 DIAGNOSIS — R739 Hyperglycemia, unspecified: Secondary | ICD-10-CM | POA: Diagnosis not present

## 2019-05-28 DIAGNOSIS — M797 Fibromyalgia: Secondary | ICD-10-CM | POA: Diagnosis not present

## 2019-05-28 DIAGNOSIS — K210 Gastro-esophageal reflux disease with esophagitis: Secondary | ICD-10-CM | POA: Diagnosis not present

## 2019-05-28 DIAGNOSIS — I10 Essential (primary) hypertension: Secondary | ICD-10-CM | POA: Diagnosis not present

## 2019-05-30 ENCOUNTER — Other Ambulatory Visit: Payer: Self-pay | Admitting: Cardiology

## 2019-06-01 DIAGNOSIS — R1013 Epigastric pain: Secondary | ICD-10-CM | POA: Diagnosis not present

## 2019-06-01 DIAGNOSIS — R739 Hyperglycemia, unspecified: Secondary | ICD-10-CM | POA: Diagnosis not present

## 2019-06-01 DIAGNOSIS — I10 Essential (primary) hypertension: Secondary | ICD-10-CM | POA: Diagnosis not present

## 2019-06-01 DIAGNOSIS — E782 Mixed hyperlipidemia: Secondary | ICD-10-CM | POA: Diagnosis not present

## 2019-06-13 ENCOUNTER — Other Ambulatory Visit: Payer: Self-pay | Admitting: Cardiology

## 2019-07-04 ENCOUNTER — Other Ambulatory Visit: Payer: Self-pay | Admitting: Cardiology

## 2019-07-06 DIAGNOSIS — Z23 Encounter for immunization: Secondary | ICD-10-CM | POA: Diagnosis not present

## 2019-08-05 DIAGNOSIS — Z23 Encounter for immunization: Secondary | ICD-10-CM | POA: Diagnosis not present

## 2019-08-30 ENCOUNTER — Other Ambulatory Visit: Payer: Self-pay | Admitting: Cardiology

## 2019-09-28 DIAGNOSIS — H6981 Other specified disorders of Eustachian tube, right ear: Secondary | ICD-10-CM | POA: Diagnosis not present

## 2019-09-29 DIAGNOSIS — H524 Presbyopia: Secondary | ICD-10-CM | POA: Diagnosis not present

## 2019-09-29 DIAGNOSIS — H40053 Ocular hypertension, bilateral: Secondary | ICD-10-CM | POA: Diagnosis not present

## 2019-11-23 DIAGNOSIS — I10 Essential (primary) hypertension: Secondary | ICD-10-CM | POA: Diagnosis not present

## 2019-11-23 DIAGNOSIS — E782 Mixed hyperlipidemia: Secondary | ICD-10-CM | POA: Diagnosis not present

## 2019-11-23 DIAGNOSIS — K2100 Gastro-esophageal reflux disease with esophagitis, without bleeding: Secondary | ICD-10-CM | POA: Diagnosis not present

## 2019-11-23 DIAGNOSIS — R739 Hyperglycemia, unspecified: Secondary | ICD-10-CM | POA: Diagnosis not present

## 2019-12-03 DIAGNOSIS — I10 Essential (primary) hypertension: Secondary | ICD-10-CM | POA: Diagnosis not present

## 2019-12-03 DIAGNOSIS — Z0001 Encounter for general adult medical examination with abnormal findings: Secondary | ICD-10-CM | POA: Diagnosis not present

## 2019-12-03 DIAGNOSIS — K5900 Constipation, unspecified: Secondary | ICD-10-CM | POA: Diagnosis not present

## 2019-12-03 DIAGNOSIS — E782 Mixed hyperlipidemia: Secondary | ICD-10-CM | POA: Diagnosis not present

## 2019-12-25 ENCOUNTER — Encounter: Payer: Self-pay | Admitting: Cardiology

## 2019-12-25 ENCOUNTER — Ambulatory Visit (INDEPENDENT_AMBULATORY_CARE_PROVIDER_SITE_OTHER): Payer: Medicare Other | Admitting: Cardiology

## 2019-12-25 ENCOUNTER — Other Ambulatory Visit: Payer: Self-pay

## 2019-12-25 VITALS — BP 122/78 | HR 88 | Ht 63.0 in | Wt 181.0 lb

## 2019-12-25 DIAGNOSIS — I10 Essential (primary) hypertension: Secondary | ICD-10-CM

## 2019-12-25 DIAGNOSIS — E782 Mixed hyperlipidemia: Secondary | ICD-10-CM

## 2019-12-25 DIAGNOSIS — I201 Angina pectoris with documented spasm: Secondary | ICD-10-CM | POA: Diagnosis not present

## 2019-12-25 NOTE — Progress Notes (Signed)
Cardiology Office Note  Date: 12/25/2019   ID: Jenna Hernandez, DOB 1951/09/11, MRN [id number]  PCP:  Manon Hilding, MD  Cardiologist:  Rozann Lesches, MD Electrophysiologist:  None   Chief Complaint  Patient presents with  . Cardiac follow-up    History of Present Illness: Jenna Hernandez is a 69 y.o. female last seen in March 2020.  She presents for a routine visit.  Reports no angina symptoms at this point on medical therapy.  She tells me that she did retire from [employer] back in May of last year.  She has been helping to take care of her granddaughter.  I reviewed her medications which are outlined below.  Sprain, Norvasc, Imdur, Cozaar, Pravachol, and has as needed nitroglycerin available.  I personally reviewed her ECG today which shows normal sinus rhythm with low voltage.  Past Medical History:  Diagnosis Date  . Antral gastritis    a. EGD 06/2015  . Anxiety   . CAD (coronary artery disease)    a. NSTEMI 12/2011 - cath smooth/normal cors, ? vasospasm with thrombosis given HK in inferior wall - readmitted same month - MD reviewed films and felt she may have had small branch occlusion off RCA accounting for WMA. b. Subsequent workups in 2014, 2015 normal. c. Readmitted 06/2015 with CP/+ troponin - cath showed no significant CAD, LVEF 55-65%  . Chronic back pain   . Coronary vasospasm ([place])   . Diverticulosis   . Essential hypertension   . Fibromyalgia   . GERD (gastroesophageal reflux disease)   . H/O hiatal hernia   . Hypercholesterolemia   . Myocardial infarction ([place])   . Obesity   . Osteoarthritis   . PUD (peptic ulcer disease)    a. EGD 06/2015  . Pulmonary nodules    a. Incidentally noted on CT by GI 06/2015    Past Surgical History:  Procedure Laterality Date  . CARDIAC CATHETERIZATION  12/2011  . CARDIAC CATHETERIZATION N/A 06/09/2015   Procedure: Left Heart Cath and Coronary Angiography;  Surgeon: Jettie Booze, MD;  Location: [place] CV  LAB;  Service: Cardiovascular;  Laterality: N/A;  . COLONOSCOPY  08/2013   [facility] /Dr.Benson  . COLONOSCOPY N/A 10/30/2018   Procedure: COLONOSCOPY;  Surgeon: Rogene Houston, MD;  [facility];  Service: Endoscopy;  Laterality: N/A;  1200  . CYSTECTOMY  ?1990's   "from my back"  . ESOPHAGOGASTRODUODENOSCOPY N/A 06/10/2015   Procedure: ESOPHAGOGASTRODUODENOSCOPY (EGD);  Surgeon: Rogene Houston, MD;  [facility];  Service: Endoscopy;  Laterality: N/A;  140  . INGUINAL HERNIA REPAIR Right ~ 1983  . LAPAROSCOPIC CHOLECYSTECTOMY  ~ 2004  . LEFT HEART CATHETERIZATION WITH CORONARY ANGIOGRAM N/A 12/13/2011   Procedure: LEFT HEART CATHETERIZATION WITH CORONARY ANGIOGRAM;  Surgeon: Thayer Headings, MD;  Location: [facility] CATH LAB;  Service: Cardiovascular;  Laterality: N/A;  . TUBAL LIGATION  1979  . UPPER GASTROINTESTINAL ENDOSCOPY  09/2012   [facility]/Dr.Benson  . VAGINAL HYSTERECTOMY  1990    Current Outpatient Medications  Medication Sig Dispense Refill  . acetaminophen (TYLENOL) 500 MG tablet Take 1,000 mg by mouth See admin instructions. Take 1000 mg at bedtime, may take an additional 1000 mg dose daily as needed for pain    . amLODipine (NORVASC) 5 MG tablet Take 1 tablet (5 mg total) by mouth daily. 90 tablet 2  . ascorbic acid (VITAMIN C) 500 MG tablet Take 500 mg by mouth daily.    Marland Kitchen  aspirin EC 81 MG tablet Take 81 mg by mouth at bedtime.    . diazepam (VALIUM) 2 MG tablet Take 1 mg by mouth See admin instructions. Take 1 mg at bedtime, may take an additional 1 mg dose daily as needed for anxiety    . docusate sodium (COLACE) 50 MG capsule Take 50 mg by mouth daily.    . isosorbide mononitrate (IMDUR) 30 MG 24 hr tablet Take 1 tablet by mouth once daily 90 tablet 1  . losartan (COZAAR) 25 MG tablet Take 1 tablet by mouth once daily 30 tablet 6  . Multiple Vitamin (MULTIVITAMIN) tablet Take 1 tablet by mouth daily.    . Multiple Vitamins-Minerals (ZINC PO) Take by mouth.    .  nitroGLYCERIN (NITROSTAT) 0.4 MG SL tablet Place 1 tablet (0.4 mg total) under the tongue every 5 (five) minutes as needed for chest pain (up to 3 doses). 25 tablet 3  . pantoprazole (PROTONIX) 40 MG tablet Take 40 mg by mouth 2 (two) times daily.     Marland Kitchen Peppermint Oil (IBGARD PO) Take 1 tablet by mouth daily as needed (ibs symptoms).    . polyethylene glycol powder (GLYCOLAX/MIRALAX) powder Take 8.5 g by mouth daily. 255 g 0  . pravastatin (PRAVACHOL) 20 MG tablet TAKE 1 TABLET BY MOUTH ONCE DAILY NEEDS  OFFICE  VISIT 30 tablet 11  . Probiotic Product (PROBIOTIC-10) CHEW Chew 2 tablets by mouth daily.    . Wheat Dextrin (BENEFIBER PO) Take 1 packet by mouth daily as needed (constipation).      No current facility-administered medications for this visit.   Allergies:  Diclofenac sodium, Nsaids, Meperidine-promethazine, Ramipril, Raspberry, Codeine, Prednisone, and Sulfa antibiotics   ROS:   No palpitations or syncope.  Physical Exam: VS:  BP 122/78   Pulse 88   Ht 5\' 3"  (1.6 m)   Wt 181 lb (82.1 kg)   SpO2 97%   BMI 32.06 kg/m , BMI Body mass index is 32.06 kg/m.  Wt Readings from Last 3 Encounters:  12/25/19 181 lb (82.1 kg)  12/19/18 180 lb (81.6 kg)  10/30/18 170 lb (77.1 kg)    General: Patient appears comfortable at rest. HEENT: Conjunctiva and lids normal, wearing a mask. Neck: Supple, no elevated JVP or carotid bruits, no thyromegaly. Lungs: Clear to auscultation, nonlabored breathing at rest. Cardiac: Regular rate and rhythm, no S3 or significant systolic murmur, no pericardial rub. Abdomen: Soft, nontender, bowel sounds present. Extremities: No pitting edema, distal pulses 2+.  ECG:  An ECG dated 12/19/2018 was personally reviewed today and demonstrated:  Sinus rhythm with R' in lead V1.  Recent Labwork:  February 2020: BUN 21, creatinine 0.77, AST 16, ALT 18, triglycerides 142, cholesterol 180, HDL 60, LDL 92, potassium 4.1, hemoglobin 14.0, platelets 255  Other  Studies Reviewed Today:  Cardiac catheterization9/10/2014:  The left ventricular systolic function is normal.  No significant CAD.  Assessment and Plan:  1.  Coronary vasospasm based on previous work-up.  She has not had any recurring angina symptoms on medical therapy.  ECG reviewed and stable.  Continue current regimen including Norvasc, Imdur, and Cozaar.  2.  Mixed hyperlipidemia, she remains on Pravachol with follow-up by Dr. Quintin Alto.  Last LDL 92.  Medication Adjustments/Labs and Tests Ordered: Current medicines are reviewed at length with the patient today.  Concerns regarding medicines are outlined above.   Tests Ordered: Orders Placed This Encounter  Procedures  . EKG 12-Lead    Medication Changes: No orders  of the defined types were placed in this encounter.   Disposition:  Follow up 1 year in the [place] office.  Signed, Satira Sark, MD, [facility] 12/25/2019 5:03 PM    [facility] [place], [place], [place] [address] Phone: [phone]; Fax: [phone]

## 2019-12-25 NOTE — Patient Instructions (Addendum)

## 2020-01-04 ENCOUNTER — Other Ambulatory Visit: Payer: Self-pay | Admitting: Cardiology

## 2020-01-31 ENCOUNTER — Other Ambulatory Visit: Payer: Self-pay | Admitting: Cardiology

## 2020-03-05 ENCOUNTER — Other Ambulatory Visit: Payer: Self-pay | Admitting: Cardiology

## 2020-03-25 DIAGNOSIS — Z1231 Encounter for screening mammogram for malignant neoplasm of breast: Secondary | ICD-10-CM | POA: Diagnosis not present

## 2020-04-13 ENCOUNTER — Other Ambulatory Visit: Payer: Self-pay | Admitting: Cardiology

## 2020-05-17 DIAGNOSIS — K2100 Gastro-esophageal reflux disease with esophagitis, without bleeding: Secondary | ICD-10-CM | POA: Diagnosis not present

## 2020-05-17 DIAGNOSIS — I10 Essential (primary) hypertension: Secondary | ICD-10-CM | POA: Diagnosis not present

## 2020-05-17 DIAGNOSIS — E1165 Type 2 diabetes mellitus with hyperglycemia: Secondary | ICD-10-CM | POA: Diagnosis not present

## 2020-05-17 DIAGNOSIS — E782 Mixed hyperlipidemia: Secondary | ICD-10-CM | POA: Diagnosis not present

## 2020-05-17 DIAGNOSIS — E1129 Type 2 diabetes mellitus with other diabetic kidney complication: Secondary | ICD-10-CM | POA: Diagnosis not present

## 2020-05-23 DIAGNOSIS — E782 Mixed hyperlipidemia: Secondary | ICD-10-CM | POA: Diagnosis not present

## 2020-05-23 DIAGNOSIS — I10 Essential (primary) hypertension: Secondary | ICD-10-CM | POA: Diagnosis not present

## 2020-05-23 DIAGNOSIS — R1013 Epigastric pain: Secondary | ICD-10-CM | POA: Diagnosis not present

## 2020-05-23 DIAGNOSIS — M542 Cervicalgia: Secondary | ICD-10-CM | POA: Diagnosis not present

## 2020-07-01 DIAGNOSIS — Z23 Encounter for immunization: Secondary | ICD-10-CM | POA: Diagnosis not present

## 2020-07-27 DIAGNOSIS — J0101 Acute recurrent maxillary sinusitis: Secondary | ICD-10-CM | POA: Diagnosis not present

## 2020-07-27 DIAGNOSIS — I10 Essential (primary) hypertension: Secondary | ICD-10-CM | POA: Diagnosis not present

## 2020-08-22 ENCOUNTER — Other Ambulatory Visit: Payer: Self-pay | Admitting: Cardiology

## 2020-09-10 ENCOUNTER — Other Ambulatory Visit: Payer: Self-pay | Admitting: Cardiology

## 2020-10-20 DIAGNOSIS — L218 Other seborrheic dermatitis: Secondary | ICD-10-CM | POA: Diagnosis not present

## 2020-10-20 DIAGNOSIS — L570 Actinic keratosis: Secondary | ICD-10-CM | POA: Diagnosis not present

## 2020-10-20 DIAGNOSIS — D485 Neoplasm of uncertain behavior of skin: Secondary | ICD-10-CM | POA: Diagnosis not present

## 2020-11-18 ENCOUNTER — Encounter: Payer: Self-pay | Admitting: Cardiology

## 2020-11-18 DIAGNOSIS — K2100 Gastro-esophageal reflux disease with esophagitis, without bleeding: Secondary | ICD-10-CM | POA: Diagnosis not present

## 2020-11-18 DIAGNOSIS — R739 Hyperglycemia, unspecified: Secondary | ICD-10-CM | POA: Diagnosis not present

## 2020-11-18 DIAGNOSIS — I10 Essential (primary) hypertension: Secondary | ICD-10-CM | POA: Diagnosis not present

## 2020-11-18 DIAGNOSIS — E7800 Pure hypercholesterolemia, unspecified: Secondary | ICD-10-CM | POA: Diagnosis not present

## 2020-11-18 DIAGNOSIS — N183 Chronic kidney disease, stage 3 unspecified: Secondary | ICD-10-CM | POA: Diagnosis not present

## 2020-11-18 DIAGNOSIS — E7849 Other hyperlipidemia: Secondary | ICD-10-CM | POA: Diagnosis not present

## 2020-11-22 DIAGNOSIS — R739 Hyperglycemia, unspecified: Secondary | ICD-10-CM | POA: Diagnosis not present

## 2020-11-22 DIAGNOSIS — E7849 Other hyperlipidemia: Secondary | ICD-10-CM | POA: Diagnosis not present

## 2020-11-22 DIAGNOSIS — I10 Essential (primary) hypertension: Secondary | ICD-10-CM | POA: Diagnosis not present

## 2020-11-22 DIAGNOSIS — R7303 Prediabetes: Secondary | ICD-10-CM | POA: Diagnosis not present

## 2020-11-22 DIAGNOSIS — R1013 Epigastric pain: Secondary | ICD-10-CM | POA: Diagnosis not present

## 2020-12-15 ENCOUNTER — Telehealth: Payer: Self-pay | Admitting: Cardiology

## 2020-12-15 NOTE — Telephone Encounter (Signed)
Advised that she does have a diagnosis of CAD. Verbalized understanding.

## 2020-12-15 NOTE — Telephone Encounter (Signed)
Patient called stating that she was trying to complete a life insurance policy.  She was ask if she had ever been diagnosed with CAD.  She would like to speak with someone regarding her diagnosis that relates to her heart.  [phone].

## 2021-02-13 DIAGNOSIS — Z79899 Other long term (current) drug therapy: Secondary | ICD-10-CM | POA: Diagnosis not present

## 2021-02-13 DIAGNOSIS — M797 Fibromyalgia: Secondary | ICD-10-CM | POA: Diagnosis not present

## 2021-02-13 DIAGNOSIS — M1990 Unspecified osteoarthritis, unspecified site: Secondary | ICD-10-CM | POA: Diagnosis not present

## 2021-02-13 DIAGNOSIS — R29890 Loss of height: Secondary | ICD-10-CM | POA: Diagnosis not present

## 2021-02-13 DIAGNOSIS — M85832 Other specified disorders of bone density and structure, left forearm: Secondary | ICD-10-CM | POA: Diagnosis not present

## 2021-02-13 DIAGNOSIS — Z9229 Personal history of other drug therapy: Secondary | ICD-10-CM | POA: Diagnosis not present

## 2021-02-13 DIAGNOSIS — Z8262 Family history of osteoporosis: Secondary | ICD-10-CM | POA: Diagnosis not present

## 2021-02-23 ENCOUNTER — Encounter: Payer: Self-pay | Admitting: Cardiology

## 2021-02-23 ENCOUNTER — Ambulatory Visit: Payer: Medicare Other | Admitting: Cardiology

## 2021-02-23 VITALS — BP 108/70 | HR 86 | Ht 62.0 in | Wt 184.0 lb

## 2021-02-23 DIAGNOSIS — I10 Essential (primary) hypertension: Secondary | ICD-10-CM

## 2021-02-23 DIAGNOSIS — I201 Angina pectoris with documented spasm: Secondary | ICD-10-CM

## 2021-02-23 NOTE — Patient Instructions (Addendum)

## 2021-02-23 NOTE — Progress Notes (Signed)
Cardiology Office Note  Date: 02/23/2021   ID: Jenna Hernandez, DOB 04/04/1951, MRN [id number]  PCP:  Manon Hilding, MD  Cardiologist:  Rozann Lesches, MD Electrophysiologist:  None   Chief Complaint  Patient presents with  . Cardiac follow-up    History of Present Illness: Jenna Hernandez is a 70 y.o. female last seen in March 2021.  She is here for a routine visit.  She does not describe any significant recurring chest discomfort on current medications.  I reviewed her current regimen as noted below.  Losartan has been uptitrated by Dr. Quintin Alto for better blood pressure control.  I personally reviewed her ECG today which shows normal sinus rhythm with low voltage.  Past Medical History:  Diagnosis Date  . Antral gastritis    a. EGD 06/2015  . Anxiety   . CAD (coronary artery disease)    a. NSTEMI 12/2011 - cath smooth/normal cors, ? vasospasm with thrombosis given HK in inferior wall - readmitted same month - MD reviewed films and felt she may have had small branch occlusion off RCA accounting for WMA. b. Subsequent workups in 2014, 2015 normal. c. Readmitted 06/2015 with CP/+ troponin - cath showed no significant CAD, LVEF 55-65%  . Chronic back pain   . Coronary vasospasm ([place])   . Diverticulosis   . Essential hypertension   . Fibromyalgia   . GERD (gastroesophageal reflux disease)   . H/O hiatal hernia   . Hypercholesterolemia   . Myocardial infarction ([place])   . Obesity   . Osteoarthritis   . PUD (peptic ulcer disease)    a. EGD 06/2015  . Pulmonary nodules    a. Incidentally noted on CT by GI 06/2015    Past Surgical History:  Procedure Laterality Date  . CARDIAC CATHETERIZATION  12/2011  . CARDIAC CATHETERIZATION N/A 06/09/2015   Procedure: Left Heart Cath and Coronary Angiography;  Surgeon: Jettie Booze, MD;  Location: [facility];  Service: Cardiovascular;  Laterality: N/A;  . COLONOSCOPY  08/2013   [facility] /Dr.Benson  . COLONOSCOPY N/A 10/30/2018    Procedure: COLONOSCOPY;  Surgeon: Rogene Houston, MD;  [facility];  Service: Endoscopy;  Laterality: N/A;  1200  . CYSTECTOMY  ?1990's   "from my back"  . ESOPHAGOGASTRODUODENOSCOPY N/A 06/10/2015   Procedure: ESOPHAGOGASTRODUODENOSCOPY (EGD);  Surgeon: Rogene Houston, MD;  [facility];  Service: Endoscopy;  Laterality: N/A;  140  . INGUINAL HERNIA REPAIR Right ~ 1983  . LAPAROSCOPIC CHOLECYSTECTOMY  ~ 2004  . LEFT HEART CATHETERIZATION WITH CORONARY ANGIOGRAM N/A 12/13/2011   Procedure: LEFT HEART CATHETERIZATION WITH CORONARY ANGIOGRAM;  Surgeon: Thayer Headings, MD;  Location[facility];  Service: Cardiovascular;  Laterality: N/A;  . TUBAL LIGATION  1979  . UPPER GASTROINTESTINAL ENDOSCOPY  09/2012   [facility]/Dr.Benson  . VAGINAL HYSTERECTOMY  1990    Current Outpatient Medications  Medication Sig Dispense Refill  . acetaminophen (TYLENOL) 500 MG tablet Take 1,000 mg by mouth See admin instructions. Take 1000 mg at bedtime, may take an additional 1000 mg dose daily as needed for pain    . amLODipine (NORVASC) 5 MG tablet Take 1 tablet by mouth once daily 90 tablet 3  . ascorbic acid (VITAMIN C) 500 MG tablet Take 500 mg by mouth daily.    Marland Kitchen aspirin EC 81 MG tablet Take 81 mg by mouth at bedtime.    . diazepam (VALIUM) 2 MG tablet Take 1 mg  by mouth See admin instructions. Take 1 mg at bedtime, may take an additional 1 mg dose daily as needed for anxiety    . docusate sodium (COLACE) 50 MG capsule Take 50 mg by mouth daily.    . isosorbide mononitrate (IMDUR) 30 MG 24 hr tablet Take 1 tablet by mouth once daily 90 tablet 1  . losartan (COZAAR) 50 MG tablet Take 50 mg by mouth daily.    . Multiple Vitamin (MULTIVITAMIN) tablet Take 1 tablet by mouth daily.    . Multiple Vitamins-Minerals (ZINC PO) Take by mouth.    . nitroGLYCERIN (NITROSTAT) 0.4 MG SL tablet Place 1 tablet (0.4 mg total) under the tongue every 5 (five) minutes as needed for chest pain (up to 3  doses). 25 tablet 3  . pantoprazole (PROTONIX) 40 MG tablet Take 40 mg by mouth 2 (two) times daily.     Marland Kitchen Peppermint Oil (IBGARD PO) Take 1 tablet by mouth daily as needed (ibs symptoms).    . polyethylene glycol powder (GLYCOLAX/MIRALAX) powder Take 8.5 g by mouth daily. 255 g 0  . pravastatin (PRAVACHOL) 20 MG tablet TAKE 1 TABLET BY MOUTH ONCE DAILY NEEDS  OFFICE  VISIT 90 tablet 1  . Probiotic Product (PROBIOTIC-10) CHEW Chew 2 tablets by mouth daily.    . Wheat Dextrin (BENEFIBER PO) Take 1 packet by mouth daily as needed (constipation).      No current facility-administered medications for this visit.   Allergies:  Diclofenac sodium, Nsaids, Meperidine-promethazine, Ramipril, Raspberry, Codeine, Prednisone, and Sulfa antibiotics   ROS: No palpitations or syncope.  Physical Exam: VS:  BP 108/70   Pulse 86   Ht 5\' 2"  (1.575 m)   Wt 184 lb (83.5 kg)   SpO2 93%   BMI 33.65 kg/m , BMI Body mass index is 33.65 kg/m.  Wt Readings from Last 3 Encounters:  02/23/21 184 lb (83.5 kg)  12/25/19 181 lb (82.1 kg)  12/19/18 180 lb (81.6 kg)    General: Patient appears comfortable at rest. HEENT: Conjunctiva and lids normal, wearing a mask. Neck: Supple, no elevated JVP or carotid bruits, no thyromegaly. Lungs: Clear to auscultation, nonlabored breathing at rest. Cardiac: Regular rate and rhythm, no S3 or significant systolic murmur, no pericardial rub. Extremities: No pitting edema.  ECG:  An ECG dated 12/25/2019 was personally reviewed today and demonstrated:  Sinus rhythm with low voltage.  Recent Labwork:  No interval lab work for review today.  Other Studies Reviewed Today:  Cardiac catheterization9/10/2014:  The left ventricular systolic function is normal.  No significant CAD.  Assessment and Plan:  1.  Coronary vasospasm, clinically stable at this point on medical therapy.  I reviewed her ECG.  Continue Norvasc, Imdur, losartan, and Pravachol.  2.  Essential  hypertension, blood pressure is well controlled today.  Losartan has been uptitrated since last encounter.  Medication Adjustments/Labs and Tests Ordered: Current medicines are reviewed at length with the patient today.  Concerns regarding medicines are outlined above.   Tests Ordered: Orders Placed This Encounter  Procedures  . EKG 12-Lead    Medication Changes: No orders of the defined types were placed in this encounter.   Disposition:  Follow up 1 year.  Signed, Satira Sark, MD, [facility] 02/23/2021 4:18 PM    [facility] [address], [place], [place] [address] Phone: [phone]; Fax: [phone]

## 2021-03-15 ENCOUNTER — Other Ambulatory Visit: Payer: Self-pay | Admitting: Cardiology

## 2021-03-28 ENCOUNTER — Encounter: Payer: Self-pay | Admitting: Adult Health

## 2021-03-28 ENCOUNTER — Ambulatory Visit (INDEPENDENT_AMBULATORY_CARE_PROVIDER_SITE_OTHER): Payer: Medicare Other | Admitting: Adult Health

## 2021-03-28 ENCOUNTER — Emergency Department (HOSPITAL_COMMUNITY): Payer: Medicare Other

## 2021-03-28 ENCOUNTER — Observation Stay (HOSPITAL_COMMUNITY)
Admission: EM | Admit: 2021-03-28 | Discharge: 2021-03-30 | Disposition: A | Discharge status: Home / Self Care | Payer: Medicare Other | Attending: Internal Medicine | Admitting: Internal Medicine

## 2021-03-28 ENCOUNTER — Other Ambulatory Visit: Payer: Self-pay

## 2021-03-28 ENCOUNTER — Encounter (HOSPITAL_COMMUNITY): Payer: Self-pay

## 2021-03-28 VITALS — BP 137/71 | HR 76 | Ht 62.0 in | Wt 181.0 lb

## 2021-03-28 DIAGNOSIS — Z87891 Personal history of nicotine dependence: Secondary | ICD-10-CM | POA: Diagnosis not present

## 2021-03-28 DIAGNOSIS — I499 Cardiac arrhythmia, unspecified: Secondary | ICD-10-CM | POA: Diagnosis not present

## 2021-03-28 DIAGNOSIS — Z7982 Long term (current) use of aspirin: Secondary | ICD-10-CM | POA: Diagnosis not present

## 2021-03-28 DIAGNOSIS — I2510 Atherosclerotic heart disease of native coronary artery without angina pectoris: Secondary | ICD-10-CM | POA: Insufficient documentation

## 2021-03-28 DIAGNOSIS — Z743 Need for continuous supervision: Secondary | ICD-10-CM | POA: Diagnosis not present

## 2021-03-28 DIAGNOSIS — Z79899 Other long term (current) drug therapy: Secondary | ICD-10-CM | POA: Insufficient documentation

## 2021-03-28 DIAGNOSIS — Z90710 Acquired absence of both cervix and uterus: Secondary | ICD-10-CM

## 2021-03-28 DIAGNOSIS — N3946 Mixed incontinence: Secondary | ICD-10-CM | POA: Insufficient documentation

## 2021-03-28 DIAGNOSIS — E876 Hypokalemia: Secondary | ICD-10-CM | POA: Diagnosis not present

## 2021-03-28 DIAGNOSIS — K219 Gastro-esophageal reflux disease without esophagitis: Secondary | ICD-10-CM | POA: Diagnosis present

## 2021-03-28 DIAGNOSIS — E785 Hyperlipidemia, unspecified: Secondary | ICD-10-CM

## 2021-03-28 DIAGNOSIS — R6889 Other general symptoms and signs: Secondary | ICD-10-CM | POA: Diagnosis not present

## 2021-03-28 DIAGNOSIS — I10 Essential (primary) hypertension: Secondary | ICD-10-CM | POA: Diagnosis not present

## 2021-03-28 DIAGNOSIS — R0789 Other chest pain: Secondary | ICD-10-CM | POA: Diagnosis not present

## 2021-03-28 DIAGNOSIS — E669 Obesity, unspecified: Secondary | ICD-10-CM | POA: Diagnosis present

## 2021-03-28 DIAGNOSIS — Z20822 Contact with and (suspected) exposure to covid-19: Secondary | ICD-10-CM | POA: Diagnosis not present

## 2021-03-28 DIAGNOSIS — Z1211 Encounter for screening for malignant neoplasm of colon: Secondary | ICD-10-CM | POA: Diagnosis not present

## 2021-03-28 DIAGNOSIS — R079 Chest pain, unspecified: Principal | ICD-10-CM | POA: Insufficient documentation

## 2021-03-28 DIAGNOSIS — R000 Tachycardia, unspecified: Secondary | ICD-10-CM | POA: Diagnosis not present

## 2021-03-28 DIAGNOSIS — I201 Angina pectoris with documented spasm: Secondary | ICD-10-CM | POA: Diagnosis present

## 2021-03-28 DIAGNOSIS — Z01419 Encounter for gynecological examination (general) (routine) without abnormal findings: Secondary | ICD-10-CM | POA: Diagnosis not present

## 2021-03-28 LAB — CBC
HCT: 36.2 % (ref 36.0–46.0)
Hemoglobin: 12.1 g/dL (ref 12.0–15.0)
MCH: 31.3 pg (ref 26.0–34.0)
MCHC: 33.4 g/dL (ref 30.0–36.0)
MCV: 93.8 fL (ref 80.0–100.0)
Platelets: 214 10*3/uL (ref 150–400)
RBC: 3.86 MIL/uL — ABNORMAL LOW (ref 3.87–5.11)
RDW: 12.5 % (ref 11.5–15.5)
WBC: 5.5 10*3/uL (ref 4.0–10.5)
nRBC: 0 % (ref 0.0–0.2)

## 2021-03-28 LAB — TROPONIN I (HIGH SENSITIVITY): Troponin I (High Sensitivity): 3 ng/L (ref <18)

## 2021-03-28 LAB — BASIC METABOLIC PANEL
Anion gap: 8 (ref 5–15)
BUN: 23 mg/dL (ref 8–23)
CO2: 22 mmol/L (ref 22–32)
Calcium: 7.7 mg/dL — ABNORMAL LOW (ref 8.9–10.3)
Chloride: 110 mmol/L (ref 98–111)
Creatinine, Ser: 0.78 mg/dL (ref 0.44–1.00)
GFR, Estimated: >60 mL/min (ref >60)
Glucose, Bld: 123 mg/dL — ABNORMAL HIGH (ref 70–99)
Potassium: 2.8 mmol/L — ABNORMAL LOW (ref 3.5–5.1)
Sodium: 140 mmol/L (ref 135–145)

## 2021-03-28 LAB — HEMOCCULT GUIAC POC 1CARD (OFFICE): Fecal Occult Blood, POC: NEGATIVE

## 2021-03-28 MED ORDER — FAMOTIDINE IN NACL 20-0.9 MG/50ML-% IV SOLN
20.0000 mg | Freq: Once | INTRAVENOUS | Status: AC
  Administered 2021-03-28: 20 mg via INTRAVENOUS
  Filled 2021-03-28: qty 50

## 2021-03-28 MED ORDER — POTASSIUM CHLORIDE CRYS ER 20 MEQ PO TBCR
40.0000 meq | EXTENDED_RELEASE_TABLET | Freq: Once | ORAL | Status: AC
  Administered 2021-03-28: 40 meq via ORAL
  Filled 2021-03-28: qty 2

## 2021-03-28 MED ORDER — CALCIUM GLUCONATE-NACL 1-0.675 GM/50ML-% IV SOLN
1.0000 g | Freq: Once | INTRAVENOUS | Status: AC
  Administered 2021-03-29: 1000 mg via INTRAVENOUS
  Filled 2021-03-28: qty 50

## 2021-03-28 MED ORDER — MIRABEGRON ER 25 MG PO TB24: 25.0000 mg | ORAL_TABLET | Freq: Every day | ORAL | 0 refills | Status: DC

## 2021-03-28 MED ORDER — MAGNESIUM OXIDE -MG SUPPLEMENT 400 (240 MG) MG PO TABS
800.0000 mg | ORAL_TABLET | Freq: Once | ORAL | Status: AC
  Administered 2021-03-28: 800 mg via ORAL
  Filled 2021-03-28: qty 2

## 2021-03-28 MED ORDER — HYDROMORPHONE HCL 1 MG/ML IJ SOLN
0.5000 mg | Freq: Once | INTRAMUSCULAR | Status: AC
  Administered 2021-03-28: 0.5 mg via INTRAVENOUS
  Filled 2021-03-28: qty 1

## 2021-03-28 NOTE — ED Provider Notes (Signed)
[facility] Emergency Department Provider Note MRN:  [id number]  Arrival date & time: 03/29/21     Chief Complaint   Chest Pain   History of Present Illness   Jenna Hernandez is a 70 y.o. year-old female with a history of CAD presenting to the ED with chief complaint of chest pain.  Location: Center of the chest Duration: 3-hour Onset: Sudden Timing: Intermittent Description: Pressure Severity: Severe Exacerbating/Alleviating Factors: None Associated Symptoms: Shortness of breath Pertinent Negatives: Denies recent fever cough, no dizziness or diaphoresis, no nausea vomiting or diarrhea.  No abdominal pain.   Review of Systems  A complete 10 system review of systems was obtained and all systems are negative except as noted in the HPI and PMH.   Patient's Health History    Past Medical History:  Diagnosis Date   Antral gastritis    a. EGD 06/2015   Anxiety    CAD (coronary artery disease)    a. NSTEMI 12/2011 - cath smooth/normal cors, ? vasospasm with thrombosis given HK in inferior wall - readmitted same month - MD reviewed films and felt she may have had small branch occlusion off RCA accounting for WMA. b. Subsequent workups in 2014, 2015 normal. c. Readmitted 06/2015 with CP/+ troponin - cath showed no significant CAD, LVEF 55-65%   Chronic back pain    Coronary vasospasm ([facility])    Dermatitis    Diverticulosis    Essential hypertension    Fibromyalgia    GERD (gastroesophageal reflux disease)    H/O hiatal hernia    Hypercholesterolemia    Myocardial infarction ([place])    Obesity    Osteoarthritis    PUD (peptic ulcer disease)    a. EGD 06/2015   Pulmonary nodules    a. Incidentally noted on CT by GI 06/2015    Past Surgical History:  Procedure Laterality Date   CARDIAC CATHETERIZATION  12/2011   CARDIAC CATHETERIZATION N/A 06/09/2015   Procedure: Left Heart Cath and Coronary Angiography;  Surgeon: Jettie Booze, MD;  Location: [place] [facility];  Service: Cardiovascular;  Laterality: N/A;   COLONOSCOPY  08/2013   [facility] /Dr.Benson   COLONOSCOPY N/A 10/30/2018   Procedure: COLONOSCOPY;  Surgeon: Rogene Houston, MD;  [facility];  Service: Endoscopy;  Laterality: N/A;  1200   CYSTECTOMY  ?7902'I   "from my back"   ESOPHAGOGASTRODUODENOSCOPY N/A 06/10/2015   Procedure: ESOPHAGOGASTRODUODENOSCOPY (EGD);  Surgeon: Rogene Houston, MD;  [facility];  Service: Endoscopy;  Laterality: N/A;  140   INGUINAL HERNIA REPAIR Right ~ [place]  ~ 2004   LEFT HEART CATHETERIZATION WITH CORONARY ANGIOGRAM N/A 12/13/2011   Procedure: LEFT HEART CATHETERIZATION WITH CORONARY ANGIOGRAM;  Surgeon: Thayer Headings, MD;  Location: [facility] CATH LAB;  Service: Cardiovascular;  Laterality: N/A;   TUBAL LIGATION  1979   UPPER GASTROINTESTINAL ENDOSCOPY  09/2012   [facility]/Dr.Benson   VAGINAL HYSTERECTOMY  1990    Family History  Problem Relation Age of Onset   Coronary artery disease Father 60   Heart attack Father 9   Diabetes Father    Irregular heart beat Mother    Bladder Cancer Mother    Colon cancer Mother    Fibromyalgia Mother    Osteoarthritis Mother    Osteoporosis Mother    Diabetes Sister     Social History   Socioeconomic History   Marital status: Divorced    Spouse name: Not  on file   Number of children: 3   Years of education: Not on file   Highest education level: Not on file  Occupational History    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  Tobacco Use   Smoking status: Former    Packs/day: 0.50    Years: 23.00    Pack years: 11.50    Types: Cigarettes    Quit date: 12/12/2011    Years since quitting: 9.3   Smokeless tobacco: Never  Vaping Use   Vaping Use: Never used  Substance and Sexual Activity   Alcohol use: No    Alcohol/week: 0.0 standard drinks   Drug use: No   Sexual activity: Not Currently    Birth control/protection:  Post-menopausal, Surgical    Comment: hyst  Other Topics Concern   Not on file  Social History Narrative   ** Merged History Encounter **       Social Determinants of Health   Financial Resource Strain: Low Risk    Difficulty of Paying Living Expenses: Not very hard  Food Insecurity: No Food Insecurity   Worried About Charity fundraiser in the Last Year: Never true   Ran Out of Food in the Last Year: Never true  Transportation Needs: No Transportation Needs   Lack of Transportation (Medical): No   Lack of Transportation (Non-Medical): No  Physical Activity: Insufficiently Active   Days of Exercise per Week: 2 days   Minutes of Exercise per Session: 40 min  Stress: Stress Concern Present   Feeling of Stress : To some extent  Social Connections: Moderately Integrated   Frequency of Communication with Friends and Family: Once a week   Frequency of Social Gatherings with Friends and Family: Twice a week   Attends Religious Services: More than 4 times per year   Active Member of [employer] or Organizations: Yes   Attends Music therapist: More than 4 times per year   Marital Status: Divorced  Human resources officer Violence: Not At Risk   Fear of Current or Ex-Partner: No   Emotionally Abused: No   Physically Abused: No   Sexually Abused: No     Physical Exam   Vitals:   03/29/21 0030 03/29/21 0130  BP: (!) 150/82 124/69  Pulse: 80 86  Resp: 19 15  Temp:    SpO2: 98% 96%    CONSTITUTIONAL: Well-appearing, NAD NEURO:  Alert and oriented x 3, no focal deficits EYES:  eyes equal and reactive ENT/NECK:  no LAD, no JVD CARDIO: Regular rate, well-perfused, normal S1 and S2 PULM:  CTAB no wheezing or rhonchi GI/GU:  normal bowel sounds, non-distended, non-tender MSK/SPINE:  No gross deformities, no edema SKIN:  no rash, atraumatic PSYCH:  Appropriate speech and behavior  *Additional and/or pertinent findings included in MDM below  Diagnostic and Interventional  Summary    EKG Interpretation  Date/Time:  Tuesday March 28 2021 22:18:36 EDT Ventricular Rate:  95 PR Interval:  135 QRS Duration: 88 QT Interval:  365 QTC Calculation: 459 R Axis:   -2 Text Interpretation: Sinus rhythm Low voltage, precordial leads RSR' in V1 or V2, right VCD or RVH No significant change was found Confirmed by Gerlene Fee [phone]) on 03/28/2021 10:59:12 PM        Labs Reviewed  BASIC METABOLIC PANEL - Abnormal; Notable for the following components:      Result Value   Potassium 2.8 (*)    Glucose, Bld 123 (*)    Calcium 7.7 (*)  All other components within normal limits  CBC - Abnormal; Notable for the following components:   RBC 3.86 (*)    All other components within normal limits  RESP PANEL BY RT-PCR (FLU A&B, COVID) ARPGX2  TROPONIN I (HIGH SENSITIVITY)  TROPONIN I (HIGH SENSITIVITY)    DG Chest 2 View  Final Result      Medications  HYDROmorphone (DILAUDID) injection 0.5 mg (0.5 mg Intravenous Given 03/28/21 2322)  famotidine (PEPCID) IVPB 20 mg premix (0 mg Intravenous Stopped 03/29/21 0006)  calcium gluconate 1 g/ 50 mL sodium chloride IVPB (0 g Intravenous Stopped 03/29/21 0111)  potassium chloride SA (KLOR-CON) CR tablet 40 mEq (40 mEq Oral Given 03/28/21 2345)  magnesium oxide (MAG-OX) tablet 800 mg (800 mg Oral Given 03/28/21 2345)  HYDROmorphone (DILAUDID) injection 0.5 mg (0.5 mg Intravenous Given 03/29/21 0115)  alum & mag hydroxide-simeth (MAALOX/MYLANTA) 200-200-20 MG/5ML suspension 30 mL (30 mLs Oral Given 03/29/21 0111)     Procedures  /  Critical Care Procedures  ED Course and Medical Decision Making  I have reviewed the triage vital signs, the nursing notes, and pertinent available records from the EMR.  Listed above are laboratory and imaging tests that I personally ordered, reviewed, and interpreted and then considered in my medical decision making (see below for details).  Suspected history of coronary vasospasm, has had chest  pain with elevated troponins but normal cardiac catheterizations in the past.  Apart from vasospasm, ACS, GI etiology also considered given pain occurred shortly after eating.  EKG is reassuring, first troponin is negative, awaiting second.  May need admission     Troponin negative x2, continued pain, will admit to medicine.  Barth Kirks. [place] Small, [place] [email or web address]  Final Clinical Impressions(s) / ED Diagnoses     ICD-10-CM   1. Chest pain, unspecified type  R07.9       ED Discharge Orders     None        Discharge Instructions Discussed with and Provided to Patient:   Discharge Instructions   None       Maudie Flakes, MD 03/29/21 [phone]

## 2021-03-28 NOTE — ED Triage Notes (Signed)
Pt to er room number 18 via ems, per ems pt is here for some chest pain, states that she took 1 ntg and 4 aspirin at home without any relief.  Pt states that she was sitting on the couch watching tv when the pain started, states that the pain seems to come and go.  States that it is sporadic.  States that she thought that it might be indigestion so she took some Mylanta, but it hasn't gotten any better.  States that she took a ntg and that only gave her a headache, states that she called 911, reports hx of heart disease with a heart attack in 2013

## 2021-03-28 NOTE — Progress Notes (Signed)
Patient ID: SKAI LICKTEIG, female   DOB: [birth year], 70 y.o.   MRN: [id number] History of Present Illness: Jenna Hernandez is a 70 year old white female, divorced sp hysterectomy in for well woman gyn exam.She is a new pt. She had hysterectomy in 1990 with bladder tack she says, has some urinary incontinence.  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  PCP is Dr Quintin Alto   Current Medications, Allergies, Past Medical History, Past Surgical History, Family History and Social History were reviewed in [facility] record.     Review of Systems: Patient denies any headaches, hearing loss, fatigue, blurred vision, shortness of breath, chest pain, abdominal pain, problems with bowel movements(has some constipation), urination, (has mixed UI)or intercourse(not active). No joint pain or mood swings.     Physical Exam:BP 137/71 (BP Location: Right Arm, Patient Position: Sitting, Cuff Size: Normal)   Pulse 76   Ht 5\' 2"  (1.575 m)   Wt 181 lb (82.1 kg)   BMI 33.11 kg/m   General:  Well developed, well nourished, no acute distress Skin:  Warm and dry Neck:  Midline trachea, normal thyroid, good ROM, no lymphadenopathy,no carotid bruits heard Lungs; Clear to auscultation bilaterally Breast:  No dominant palpable mass, retraction, or nipple discharge Cardiovascular: Regular rate and rhythm Abdomen:  Soft, non tender, no hepatosplenomegaly Pelvic:  External genitalia is normal in appearance, no lesions.  The vagina is pale with loss or moisture and rugae. Urethra has no lesions or masses. The cervix and utereus are absent.  No adnexal masses or tenderness noted.Bladder is non tender, no masses felt. Rectal: Good sphincter tone, no polyps, or hemorrhoids felt.  Hemoccult negative. Extremities/musculoskeletal:  No swelling or varicosities noted, no clubbing or cyanosis Psych:  No mood changes, alert and cooperative,seems happy AA is 0 Fall risk is low Depression screen [facility] 2/9  03/28/2021  Decreased Interest 0  Down, Depressed, Hopeless 0  PHQ - 2 Score 0  Altered sleeping 0  Tired, decreased energy 2  Change in appetite 2  Feeling bad or failure about yourself  0  Trouble concentrating 0  Moving slowly or fidgety/restless 0  Suicidal thoughts 0  PHQ-9 Score 4    GAD 7 : Generalized Anxiety Score 03/28/2021  Nervous, Anxious, on Edge 0  Control/stop worrying 0  Worry too much - different things 1  Trouble relaxing 0  Restless 1  Easily annoyed or irritable 1  Afraid - awful might happen 0  Total GAD 7 Score 3      Upstream - 03/28/21 1044       Pregnancy Intention Screening   Does the patient want to become pregnant in the next year? N/A    Does the patient's partner want to become pregnant in the next year? N/A    Would the patient like to discuss contraceptive options today? No      Contraception Wrap Up   Current Method Female Sterilization   hyst   End Method Female Sterilization   hyst   Contraception Counseling Provided No            Examination chaperoned by [employer] RN  Impression and Plan:  1. Encounter for well woman exam with routine gynecological exam Physical in 1 year Mammogram 8/9 in [facility] Colonoscopy with Dr Laural Golden every 5 years Labs with PCP   2. Encounter for screening fecal occult blood testing   3. Mixed stress and urge urinary incontinence Trial of myrbetriq, gave samples,will let me  know if helps  Meds ordered this encounter  Medications   mirabegron ER (MYRBETRIQ) 25 MG TB24 tablet    Sig: Take 1 tablet (25 mg total) by mouth daily.    Dispense:  56 tablet    Refill:  0    Order Specific Question:   Supervising Provider    Answer:   Elonda Husky, LUTHER H [[id number]]     4. S/P hysterectomy

## 2021-03-29 ENCOUNTER — Observation Stay (HOSPITAL_BASED_OUTPATIENT_CLINIC_OR_DEPARTMENT_OTHER): Payer: Medicare Other

## 2021-03-29 DIAGNOSIS — E782 Mixed hyperlipidemia: Secondary | ICD-10-CM | POA: Diagnosis not present

## 2021-03-29 DIAGNOSIS — E876 Hypokalemia: Secondary | ICD-10-CM

## 2021-03-29 DIAGNOSIS — R0789 Other chest pain: Secondary | ICD-10-CM | POA: Diagnosis not present

## 2021-03-29 DIAGNOSIS — R079 Chest pain, unspecified: Secondary | ICD-10-CM | POA: Diagnosis not present

## 2021-03-29 DIAGNOSIS — K219 Gastro-esophageal reflux disease without esophagitis: Secondary | ICD-10-CM | POA: Diagnosis not present

## 2021-03-29 DIAGNOSIS — E785 Hyperlipidemia, unspecified: Secondary | ICD-10-CM

## 2021-03-29 DIAGNOSIS — I10 Essential (primary) hypertension: Secondary | ICD-10-CM | POA: Diagnosis not present

## 2021-03-29 DIAGNOSIS — I201 Angina pectoris with documented spasm: Secondary | ICD-10-CM

## 2021-03-29 LAB — BLOOD GAS, ARTERIAL
Acid-Base Excess: 2.5 mmol/L — ABNORMAL HIGH (ref 0.0–2.0)
Bicarbonate: 26 mmol/L (ref 20.0–28.0)
FIO2: 28
O2 Saturation: 96.1 %
Patient temperature: 37
pCO2 arterial: 49.6 mmHg — ABNORMAL HIGH (ref 32.0–48.0)
pH, Arterial: 7.362 (ref 7.350–7.450)
pO2, Arterial: 91.8 mmHg (ref 83.0–108.0)

## 2021-03-29 LAB — RESP PANEL BY RT-PCR (FLU A&B, COVID) ARPGX2
Influenza A by PCR: NEGATIVE
Influenza B by PCR: NEGATIVE
SARS Coronavirus 2 by RT PCR: NEGATIVE

## 2021-03-29 LAB — BASIC METABOLIC PANEL
Anion gap: 7 (ref 5–15)
BUN: 22 mg/dL (ref 8–23)
CO2: 28 mmol/L (ref 22–32)
Calcium: 9.4 mg/dL (ref 8.9–10.3)
Chloride: 104 mmol/L (ref 98–111)
Creatinine, Ser: 0.87 mg/dL (ref 0.44–1.00)
GFR, Estimated: >60 mL/min (ref >60)
Glucose, Bld: 156 mg/dL — ABNORMAL HIGH (ref 70–99)
Potassium: 4.3 mmol/L (ref 3.5–5.1)
Sodium: 139 mmol/L (ref 135–145)

## 2021-03-29 LAB — MRSA NEXT GEN BY PCR, NASAL: MRSA by PCR Next Gen: NOT DETECTED

## 2021-03-29 LAB — ECHOCARDIOGRAM COMPLETE
AR max vel: 2.52 cm2
AV Area VTI: 3.25 cm2
AV Area mean vel: 2.96 cm2
AV Mean grad: 4 mmHg
AV Peak grad: 9.1 mmHg
Ao pk vel: 1.51 m/s
Area-P 1/2: 3.23 cm2
Height: 62 in
MV VTI: 2.56 cm2
S' Lateral: 2.23 cm
Weight: 2896 oz

## 2021-03-29 LAB — TROPONIN I (HIGH SENSITIVITY)
Troponin I (High Sensitivity): 6 ng/L (ref <18)
Troponin I (High Sensitivity): 3 ng/L (ref <18)

## 2021-03-29 LAB — D-DIMER, QUANTITATIVE: D-Dimer, Quant: 0.39 ug/mL-FEU (ref 0.00–0.50)

## 2021-03-29 MED ORDER — SODIUM CHLORIDE 0.9 % IV SOLN: INTRAVENOUS | Status: DC

## 2021-03-29 MED ORDER — LOSARTAN POTASSIUM 50 MG PO TABS
50.0000 mg | ORAL_TABLET | Freq: Every day | ORAL | Status: DC
  Administered 2021-03-29 – 2021-03-30 (×2): 50 mg via ORAL
  Filled 2021-03-29 (×2): qty 1

## 2021-03-29 MED ORDER — ALUM & MAG HYDROXIDE-SIMETH 200-200-20 MG/5ML PO SUSP
30.0000 mL | Freq: Once | ORAL | Status: AC
  Administered 2021-03-29: 30 mL via ORAL
  Filled 2021-03-29: qty 30

## 2021-03-29 MED ORDER — ONDANSETRON HCL 4 MG/2ML IJ SOLN
4.0000 mg | Freq: Once | INTRAMUSCULAR | Status: AC
  Administered 2021-03-29: 4 mg via INTRAVENOUS
  Filled 2021-03-29: qty 2

## 2021-03-29 MED ORDER — ISOSORBIDE MONONITRATE ER 30 MG PO TB24
30.0000 mg | ORAL_TABLET | Freq: Every day | ORAL | Status: DC
  Administered 2021-03-29 – 2021-03-30 (×2): 30 mg via ORAL
  Filled 2021-03-29 (×2): qty 1

## 2021-03-29 MED ORDER — POTASSIUM CHLORIDE IN NACL 40-0.9 MEQ/L-% IV SOLN: INTRAVENOUS | Status: DC

## 2021-03-29 MED ORDER — MIRABEGRON ER 25 MG PO TB24
25.0000 mg | ORAL_TABLET | Freq: Every day | ORAL | Status: DC
  Administered 2021-03-29 – 2021-03-30 (×2): 25 mg via ORAL
  Filled 2021-03-29 (×2): qty 1

## 2021-03-29 MED ORDER — ACETAMINOPHEN 325 MG PO TABS: 650.0000 mg | ORAL_TABLET | Freq: Four times a day (QID) | ORAL | Status: DC | PRN

## 2021-03-29 MED ORDER — ADULT MULTIVITAMIN W/MINERALS CH
1.0000 | ORAL_TABLET | Freq: Every day | ORAL | Status: DC
  Administered 2021-03-29 – 2021-03-30 (×2): 1 via ORAL
  Filled 2021-03-29 (×2): qty 1

## 2021-03-29 MED ORDER — ONDANSETRON HCL 4 MG/2ML IJ SOLN
4.0000 mg | Freq: Four times a day (QID) | INTRAMUSCULAR | Status: DC | PRN
  Administered 2021-03-29: 4 mg via INTRAVENOUS
  Filled 2021-03-29: qty 2

## 2021-03-29 MED ORDER — PRAVASTATIN SODIUM 10 MG PO TABS
20.0000 mg | ORAL_TABLET | Freq: Every day | ORAL | Status: DC
  Administered 2021-03-29 – 2021-03-30 (×2): 20 mg via ORAL
  Filled 2021-03-29 (×2): qty 2

## 2021-03-29 MED ORDER — DOCUSATE SODIUM 100 MG PO CAPS
100.0000 mg | ORAL_CAPSULE | Freq: Every day | ORAL | Status: DC
  Administered 2021-03-29 – 2021-03-30 (×2): 100 mg via ORAL
  Filled 2021-03-29 (×2): qty 1

## 2021-03-29 MED ORDER — SUCRALFATE 1 GM/10ML PO SUSP
1.0000 g | Freq: Three times a day (TID) | ORAL | Status: DC
  Administered 2021-03-29 – 2021-03-30 (×4): 1 g via ORAL
  Filled 2021-03-29 (×4): qty 10

## 2021-03-29 MED ORDER — DOCUSATE SODIUM 50 MG PO CAPS
50.0000 mg | ORAL_CAPSULE | Freq: Every day | ORAL | Status: DC
  Filled 2021-03-29 (×3): qty 1

## 2021-03-29 MED ORDER — NITROGLYCERIN 0.4 MG SL SUBL: 0.4000 mg | SUBLINGUAL_TABLET | SUBLINGUAL | Status: DC | PRN

## 2021-03-29 MED ORDER — LIDOCAINE VISCOUS HCL 2 % MT SOLN
15.0000 mL | Freq: Once | OROMUCOSAL | Status: AC
  Administered 2021-03-29: 15 mL via ORAL
  Filled 2021-03-29: qty 15

## 2021-03-29 MED ORDER — PROCHLORPERAZINE EDISYLATE 10 MG/2ML IJ SOLN: 10.0000 mg | Freq: Four times a day (QID) | INTRAMUSCULAR | Status: DC | PRN

## 2021-03-29 MED ORDER — ENOXAPARIN SODIUM 40 MG/0.4ML IJ SOSY
40.0000 mg | PREFILLED_SYRINGE | INTRAMUSCULAR | Status: DC
  Administered 2021-03-29: 40 mg via SUBCUTANEOUS
  Filled 2021-03-29 (×2): qty 0.4

## 2021-03-29 MED ORDER — HYDROMORPHONE HCL 1 MG/ML IJ SOLN
0.5000 mg | INTRAMUSCULAR | Status: DC | PRN
  Administered 2021-03-29 (×2): 0.5 mg via INTRAVENOUS
  Filled 2021-03-29 (×2): qty 1

## 2021-03-29 MED ORDER — ASCORBIC ACID 500 MG PO TABS
500.0000 mg | ORAL_TABLET | Freq: Every day | ORAL | Status: DC
  Administered 2021-03-29 – 2021-03-30 (×2): 500 mg via ORAL
  Filled 2021-03-29 (×2): qty 1

## 2021-03-29 MED ORDER — CHLORHEXIDINE GLUCONATE CLOTH 2 % EX PADS
6.0000 | MEDICATED_PAD | Freq: Every day | CUTANEOUS | Status: DC
  Administered 2021-03-29: 6 via TOPICAL

## 2021-03-29 MED ORDER — AMLODIPINE BESYLATE 5 MG PO TABS
5.0000 mg | ORAL_TABLET | Freq: Every day | ORAL | Status: DC
  Administered 2021-03-29 – 2021-03-30 (×2): 5 mg via ORAL
  Filled 2021-03-29 (×2): qty 1

## 2021-03-29 MED ORDER — PANTOPRAZOLE SODIUM 40 MG PO TBEC
40.0000 mg | DELAYED_RELEASE_TABLET | Freq: Two times a day (BID) | ORAL | Status: DC
  Administered 2021-03-29 – 2021-03-30 (×3): 40 mg via ORAL
  Filled 2021-03-29 (×3): qty 1

## 2021-03-29 MED ORDER — HYDROMORPHONE HCL 1 MG/ML IJ SOLN
0.5000 mg | Freq: Once | INTRAMUSCULAR | Status: AC
  Administered 2021-03-29: 0.5 mg via INTRAVENOUS
  Filled 2021-03-29: qty 1

## 2021-03-29 MED ORDER — DIAZEPAM 2 MG PO TABS: 1.0000 mg | ORAL_TABLET | ORAL | Status: DC

## 2021-03-29 MED ORDER — PANTOPRAZOLE SODIUM 40 MG IV SOLR
40.0000 mg | INTRAVENOUS | Status: DC
  Administered 2021-03-29: 40 mg via INTRAVENOUS
  Filled 2021-03-29: qty 40

## 2021-03-29 MED ORDER — METOCLOPRAMIDE HCL 5 MG/ML IJ SOLN
10.0000 mg | Freq: Three times a day (TID) | INTRAMUSCULAR | Status: DC
  Administered 2021-03-29 – 2021-03-30 (×3): 10 mg via INTRAVENOUS
  Filled 2021-03-29 (×3): qty 2

## 2021-03-29 NOTE — Progress Notes (Signed)
Patient seen and examined. Admitted after midnight secondary to CP. Patient reported mid chest discomfort with associated SOB, nausea and vomiting. Hemodynamically stable.  No acute ischemic changes appreciated on EKG and troponin negative x2.  Please refer to H&P written by Dr. Josephine Cables on 03/29/2021 for further info/details on admission.  Plan -Check 2D echo to rule out any wall motion abnormalities or changes in ejection fraction. -So far doubt in component of coronary artery disease as reason for patient pain. -Will continue treatment for reflux and GI source -advance diet slowly as tolerated.  Barton Dubois MD [phone]

## 2021-03-29 NOTE — H&P (Signed)
History and Physical  Jenna Hernandez WLS:[id number] DOB: [birth year] DOA: 03/28/2021  Referring physician: Maudie Flakes, MD  PCP: Manon Hilding, MD  Patient coming from: Home  Chief Complaint: Chest pain  HPI: Jenna Hernandez is a 70 y.o. female with medical history significant for CAD, fibromyalgia, coronary vasospasm, essential hypertension, GERD, hyperlipidemia and obesity who presents to the emergency department accompanied by brother for the evaluation of chest pain which started few hours prior to presenting to the ED.  Patient states that she was just sitting down at home watching TV when she suddenly felt a low of mid sternal pain was nonreproducible and was described as pressure which was rated as 8/10 on pain scale.  She took 1 nitroglycerin and 4 baby aspirin at home without any relief, pain was intermittent, she thought it might be due to indigestion, so she took Mylanta which did not alleviate the chest pain, she complained of headache which she thought was due to the nitroglycerin taken, EMS was activated and patient was taken to the ED for further evaluation and management.  Patient endorsed history of heart attack in 2013.  She denies fever, chills, nausea, vomiting or abdominal pain.  ED Course:  In the emergency department, she was hemodynamically stable, O2 sat was above 90s on room air.  In the ED shows normal CBC and BMP showed hypokalemia and mild hyperglycemia.  D-dimer was 0.39.  Influenza A, B, SARS coronavirus 2 was negative.  Chest x-ray showed lingular subsegmental atelectasis or scarring. GI cocktail, Pepcid, Dilaudid were given.  Hospitalist was asked to admit patient for further evaluation and management.  Review of Systems: Constitutional: Negative for chills and fever.  HENT: Negative for ear pain and sore throat.   Eyes: Negative for pain and visual disturbance.  Respiratory: Negative for cough, chest tightness and shortness of breath.   Cardiovascular:  Positive for chest pain and negative for palpitations.  Gastrointestinal: Negative for abdominal pain and vomiting.  Endocrine: Negative for polyphagia and polyuria.  Genitourinary: Negative for decreased urine volume, dysuria, enuresis Musculoskeletal: Negative for arthralgias and back pain.  Skin: Negative for color change and rash.  Allergic/Immunologic: Negative for immunocompromised state.  Neurological: Negative for tremors, syncope, speech difficulty, weakness, light-headedness and headaches.  Hematological: Does not bruise/bleed easily.  All other systems reviewed and are negative   Past Medical History:  Diagnosis Date   Antral gastritis    a. EGD 06/2015   Anxiety    CAD (coronary artery disease)    a. NSTEMI 12/2011 - cath smooth/normal cors, ? vasospasm with thrombosis given HK in inferior wall - readmitted same month - MD reviewed films and felt she may have had small branch occlusion off RCA accounting for WMA. b. Subsequent workups in 2014, 2015 normal. c. Readmitted 06/2015 with CP/+ troponin - cath showed no significant CAD, LVEF 55-65%   Chronic back pain    Coronary vasospasm (HCC)    Dermatitis    Diverticulosis    Essential hypertension    Fibromyalgia    GERD (gastroesophageal reflux disease)    H/O hiatal hernia    Hypercholesterolemia    Myocardial infarction ([place])    Obesity    Osteoarthritis    PUD (peptic ulcer disease)    a. EGD 06/2015   Pulmonary nodules    a. Incidentally noted on CT by GI 06/2015   Past Surgical History:  Procedure Laterality Date   CARDIAC CATHETERIZATION  12/2011   CARDIAC CATHETERIZATION N/A 06/09/2015  Procedure: Left Heart Cath and Coronary Angiography;  Surgeon: Jettie Booze, MD;  Location: [facility];  Service: Cardiovascular;  Laterality: N/A;   COLONOSCOPY  08/2013   [facility] /Dr.Benson   COLONOSCOPY N/A 10/30/2018   Procedure: COLONOSCOPY;  Surgeon: Rogene Houston, MD;  Location: [facility];  Service:  Endoscopy;  Laterality: N/A;  1200   CYSTECTOMY  ?2952'W   "from my back"   ESOPHAGOGASTRODUODENOSCOPY N/A 06/10/2015   Procedure: ESOPHAGOGASTRODUODENOSCOPY (EGD);  Surgeon: Rogene Houston, MD;  Location: [facility];  Service: Endoscopy;  Laterality: N/A;  140   INGUINAL HERNIA REPAIR Right ~ [place]  ~ 2004   LEFT HEART CATHETERIZATION WITH CORONARY ANGIOGRAM N/A 12/13/2011   Procedure: LEFT HEART CATHETERIZATION WITH CORONARY ANGIOGRAM;  Surgeon: Thayer Headings, MD;  Location: [facility];  Service: Cardiovascular;  Laterality: N/A;   TUBAL LIGATION  1979   UPPER GASTROINTESTINAL ENDOSCOPY  09/2012   [facility]/Dr.Benson   VAGINAL HYSTERECTOMY  1990    Social History:  reports that she quit smoking about 9 years ago. Her smoking use included cigarettes. She has a 11.50 pack-year smoking history. She has never used smokeless tobacco. She reports that she does not drink alcohol and does not use drugs.   Allergies  Allergen Reactions   Diclofenac Sodium Anaphylaxis    Tolerates aspirin - was told not to take antiinflammatory meds.   Nsaids Anaphylaxis   Meperidine-Promethazine Other (See Comments)    headache   Ramipril Other (See Comments)    cough   Raspberry Hives   Codeine Rash   Prednisone Palpitations   Sulfa Antibiotics Rash    Family History  Problem Relation Age of Onset   Coronary artery disease Father 2   Heart attack Father 89   Diabetes Father    Irregular heart beat Mother    Bladder Cancer Mother    Colon cancer Mother    Fibromyalgia Mother    Osteoarthritis Mother    Osteoporosis Mother    Diabetes Sister      Prior to Admission medications   Medication Sig Start Date End Date Taking? Authorizing Provider  acetaminophen (TYLENOL) 500 MG tablet Take 1,000 mg by mouth See admin instructions. Take 1000 mg at bedtime, may take an additional 1000 mg dose daily as needed for pain   Yes [provider]  amLODipine (NORVASC) 5  MG tablet Take 1 tablet by mouth once daily 04/13/20  Yes Satira Sark, MD  ascorbic acid (VITAMIN C) 500 MG tablet Take 500 mg by mouth daily.   Yes [provider]  aspirin EC 81 MG tablet Take 81 mg by mouth at bedtime.   Yes [provider]  diazepam (VALIUM) 2 MG tablet Take 1 mg by mouth See admin instructions. Take 1 mg at bedtime, may take an additional 1 mg dose daily as needed for anxiety   Yes [provider]  docusate sodium (COLACE) 50 MG capsule Take 50 mg by mouth daily.   Yes [provider]  isosorbide mononitrate (IMDUR) 30 MG 24 hr tablet Take 1 tablet by mouth once daily 03/15/21  Yes Satira Sark, MD  losartan (COZAAR) 50 MG tablet Take 50 mg by mouth daily.   Yes [provider]  mirabegron ER (MYRBETRIQ) 25 MG TB24 tablet Take 1 tablet (25 mg total) by mouth daily. 03/28/21  Yes Estill Dooms, NP  Multiple Vitamin (MULTIVITAMIN) tablet Take 1 tablet by  mouth daily.   Yes [provider]  Multiple Vitamins-Minerals (ZINC PO) Take by mouth.   Yes [provider]  nitroGLYCERIN (NITROSTAT) 0.4 MG SL tablet Place 1 tablet (0.4 mg total) under the tongue every 5 (five) minutes as needed for chest pain (up to 3 doses). 11/28/16  Yes Satira Sark, MD  pantoprazole (PROTONIX) 40 MG tablet Take 40 mg by mouth 2 (two) times daily.    Yes [provider]  Peppermint Oil (IBGARD PO) Take 1 tablet by mouth daily as needed (ibs symptoms).   Yes [provider]  pravastatin (PRAVACHOL) 20 MG tablet TAKE 1 TABLET BY MOUTH ONCE DAILY . APPOINTMENT REQUIRED FOR FUTURE REFILLS Patient taking differently: Take 20 mg by mouth daily. 03/15/21  Yes Satira Sark, MD  Probiotic Product (PROBIOTIC-10) CHEW Chew 2 tablets by mouth daily.   Yes [provider]  Wheat Dextrin (BENEFIBER PO) Take 1 packet by mouth daily as needed (constipation).    Yes [provider]  polyethylene  glycol powder (GLYCOLAX/MIRALAX) powder Take 8.5 g by mouth daily. Patient not taking: No sig reported 10/30/18   Rogene Houston, MD    Physical Exam: BP 124/66   Pulse (!) 58   Temp 98.4 F (36.9 C) (Oral)   Resp 15   Ht 5\' 2"  (1.575 m)   Wt 82.1 kg   SpO2 96%   BMI 33.11 kg/m   General: 70 y.o. year-old female well developed well nourished in no acute distress.  Alert and oriented x3. HEENT: NCAT, EOMI Neck: Supple, trachea medial Cardiovascular: Regular rate and rhythm with no rubs or gallops.  No thyromegaly or JVD noted.  No lower extremity edema. 2/4 pulses in all 4 extremities. Respiratory: Clear to auscultation with no wheezes or rales. Good inspiratory effort. Abdomen: Soft, nontender nondistended with normal bowel sounds x4 quadrants. Muskuloskeletal: No cyanosis, clubbing or edema noted bilaterally Neuro: CN II-XII intact, strength 5/5 x 4, sensation, reflexes intact Skin: No ulcerative lesions noted or rashes Psychiatry: Judgement and insight appear normal. Mood is appropriate for condition and setting          Labs on Admission:  Basic Metabolic Panel: Recent Labs  Lab 03/28/21 2236  NA 140  K 2.8*  CL 110  CO2 22  GLUCOSE 123*  BUN 23  CREATININE 0.78  CALCIUM 7.7*   Liver Function Tests: No results for input(s): AST, ALT, ALKPHOS, BILITOT, PROT, ALBUMIN in the last 168 hours. No results for input(s): LIPASE, AMYLASE in the last 168 hours. No results for input(s): AMMONIA in the last 168 hours. CBC: Recent Labs  Lab 03/28/21 2236  WBC 5.5  HGB 12.1  HCT 36.2  MCV 93.8  PLT 214   Cardiac Enzymes: No results for input(s): CKTOTAL, CKMB, CKMBINDEX, TROPONINI in the last 168 hours.  BNP (last 3 results) No results for input(s): BNP in the last 8760 hours.  ProBNP (last 3 results) No results for input(s): PROBNP in the last 8760 hours.  CBG: No results for input(s): GLUCAP in the last 168 hours.  Radiological Exams on Admission: DG Chest  2 View  Result Date: 03/28/2021 CLINICAL DATA:  Chest pain today. EXAM: CHEST - 2 VIEW COMPARISON:  None. FINDINGS: The cardiomediastinal contours are normal. Lingular subsegmental atelectasis or scarring. Pulmonary vasculature is normal. No consolidation, pleural effusion, or pneumothorax. No acute osseous abnormalities are seen. IMPRESSION: Lingular subsegmental atelectasis or scarring. Electronically Signed   By: Aurther Loft.D.  On: 03/28/2021 23:01    EKG: I independently viewed the EKG done and my findings are as followed: Sinus tachycardia at rate of 115 bpm  Assessment/Plan Present on Admission:  CAD (coronary artery disease)  Coronary vasospasm ([place])  Essential hypertension  GERD (gastroesophageal reflux disease)  Obesity  Active Problems:   CAD (coronary artery disease)   GERD (gastroesophageal reflux disease)   Coronary vasospasm (HCC)   Obesity   Essential hypertension   Atypical chest pain   Hypokalemia   Hyperlipidemia   Atypical chest pain Patient presents with lower midsternal nonreproducible pressure-like, nonradiating, intermittent chest pain Troponin x2 was negative Heart score = 4 Continue telemetry  EKG personally reviewed showed sinus tachycardia at a rate of 115 bpm Cardiology will be consulted to help decide if Stress test is needed in am Versus other diagnostic modalities.    Hypokalemia K+ 2.8; this was replenished  GERD with possible dyspepsia Continue Protonix  History of CAD/coronary vasospasm Last cath done in 06/2015 due to chest pain with positive troponin showed no significant CAD Continue aspirin, pravastatin, Imdur  Essential hypertension (controlled) Continue amlodipine, losartan, Imdur  Anxiety Continue Valium  Overactive bladder Continue Myrbetriq  Obesity (BMI 33.11) Patient will be counseled on diet and lifestyle modification   Order home meds: Vitamin C, multivitamin    DVT prophylaxis: Lovenox  Code Status:  Full code  Family Communication: Brother at bedside (all questions answered to satisfaction)  Disposition Plan:  Patient is from:                        home Anticipated DC to:                   SNF or family members home Anticipated DC date:               2-3 days Anticipated DC barriers:          Patient requires inpatient management due to atypical chest pain pending cardiology consult  Consults called: Cardiology  Admission status: Observation    Bernadette Hoit MD [facility]  03/29/2021, 9:12 AM

## 2021-03-29 NOTE — Care Management Obs Status (Signed)
[facility] NOTIFICATION   Patient Details  Name: Jenna Hernandez MRN: [id number] Date of Birth: [birth year]   Medicare Observation Status Notification Given:  Yes    Shade Flood, LCSW 03/29/2021, 4:48 PM

## 2021-03-29 NOTE — Consult Note (Addendum)
Cardiology Consultation:   Patient ID: Jenna Hernandez MRN: [id number]; DOB: 22-Nov-1950  Admit date: 03/28/2021 Date of Consult: 03/29/2021  PCP:  Manon Hilding, MD   [place] [facility] Cardiologist:  Rozann Lesches, MD   {  Patient Profile:   Jenna Hernandez is a 70 y.o. female with a past medical history of coronary vasospasm (s/p NSTEMI in 12/2011 with cath showing possible branch occlusion off RCA and medical management recommended, readmitted in 06/2015 with cath showing no significant CAD), HTN, Fibromyalgia, pulmonary nodules and GERD who is being seen 03/29/2021 for the evaluation of chest pain at the request of Dr. Josephine Cables.  History of Present Illness:   Jenna Hernandez was recently examined by Dr. Domenic Polite in 02/2021 and denied any recent chest pain or dyspnea at that time. She was continued on current cardiac medications including Amlodipine 5 mg daily, ASA 81 mg daily, Imdur 30 mg daily and Losartan 50 mg daily.  She presented to [facility] ED on 03/28/2021 for evaluation of chest pain which started while watching television.  She reports feeling like an "electric current" sensation along her chest followed by a generalized discomfort. She initially felt symptoms were likely secondary to acid reflux (had tacos for dinner) and took Mylanta without improvement. Had a recurrent sensation and took nitroglycerin and ASA without improvement. Says she felt a "flipping" sensation in her chest as well. No associated radiating pain, nausea, vomiting or diaphoresis. Reports the symptoms did not resemble her prior vasospasms as her main symptom at that time was pressure along her neck and into her jaw. She denies any recent orthopnea, PND or pitting edema. Has been under increased stress as she is the primary caregiver for her dad and her sister recently started staying with her as well due to her sister's husband passing away.   Initial labs show WBC 5.5, Hgb 12.1, platelets 214, Na+ 140, K+  2.8 and creatinine 0.78. Calcium 7.7. Initial and repeat Hs Troponin values negative at 3 and 6.  CXR showing atelectasis or possible scarring. EKG shows NSR, HR 95 with minimal ST elevation along V1 and V2.  She did receive Dilaudid this AM and had experienced nausea since. Denies any nausea at the time of symptom onset yesterday.    Past Medical History:  Diagnosis Date   Antral gastritis    a. EGD 06/2015   Anxiety    CAD (coronary artery disease)    a. NSTEMI 12/2011 - cath smooth/normal cors, ? vasospasm with thrombosis given HK in inferior wall - readmitted same month - MD reviewed films and felt she may have had small branch occlusion off RCA accounting for WMA. b. Subsequent workups in 2014, 2015 normal. c. Readmitted 06/2015 with CP/+ troponin - cath showed no significant CAD, LVEF 55-65%   Chronic back pain    Coronary vasospasm (HCC)    Dermatitis    Diverticulosis    Essential hypertension    Fibromyalgia    GERD (gastroesophageal reflux disease)    H/O hiatal hernia    Hypercholesterolemia    Myocardial infarction ([place])    Obesity    Osteoarthritis    PUD (peptic ulcer disease)    a. EGD 06/2015   Pulmonary nodules    a. Incidentally noted on CT by GI 06/2015    Past Surgical History:  Procedure Laterality Date   CARDIAC CATHETERIZATION  12/2011   CARDIAC CATHETERIZATION N/A 06/09/2015   Procedure: Left Heart Cath and Coronary Angiography;  Surgeon: Charlann Lange  Irish Lack, MD;  Location: [facility];  Service: Cardiovascular;  Laterality: N/A;   COLONOSCOPY  08/2013   [facility] /Dr.Benson   COLONOSCOPY N/A 10/30/2018   Procedure: COLONOSCOPY;  Surgeon: Rogene Houston, MD;  Location: [facility];  Service: Endoscopy;  Laterality: N/A;  1200   CYSTECTOMY  ?[id number]'A   "from my back"   ESOPHAGOGASTRODUODENOSCOPY N/A 06/10/2015   Procedure: ESOPHAGOGASTRODUODENOSCOPY (EGD);  Surgeon: Rogene Houston, MD;  Location: [facility];  Service: Endoscopy;  Laterality: N/A;  140    INGUINAL HERNIA REPAIR Right ~ [place]  ~ 2004   LEFT HEART CATHETERIZATION WITH CORONARY ANGIOGRAM N/A 12/13/2011   Procedure: LEFT HEART CATHETERIZATION WITH CORONARY ANGIOGRAM;  Surgeon: Thayer Headings, MD;  Location: [facility] CATH LAB;  Service: Cardiovascular;  Laterality: N/A;   TUBAL LIGATION  1979   UPPER GASTROINTESTINAL ENDOSCOPY  09/2012   [facility]/Dr.Benson   VAGINAL HYSTERECTOMY  1990     Home Medications:  Prior to Admission medications   Medication Sig Start Date End Date Taking? Authorizing Provider  acetaminophen (TYLENOL) 500 MG tablet Take 1,000 mg by mouth See admin instructions. Take 1000 mg at bedtime, may take an additional 1000 mg dose daily as needed for pain   Yes [provider]  amLODipine (NORVASC) 5 MG tablet Take 1 tablet by mouth once daily 04/13/20  Yes Satira Sark, MD  ascorbic acid (VITAMIN C) 500 MG tablet Take 500 mg by mouth daily.   Yes [provider]  aspirin EC 81 MG tablet Take 81 mg by mouth at bedtime.   Yes [provider]  diazepam (VALIUM) 2 MG tablet Take 1 mg by mouth See admin instructions. Take 1 mg at bedtime, may take an additional 1 mg dose daily as needed for anxiety   Yes [provider]  docusate sodium (COLACE) 50 MG capsule Take 50 mg by mouth daily.   Yes [provider]  isosorbide mononitrate (IMDUR) 30 MG 24 hr tablet Take 1 tablet by mouth once daily 03/15/21  Yes Satira Sark, MD  losartan (COZAAR) 50 MG tablet Take 50 mg by mouth daily.   Yes [provider]  mirabegron ER (MYRBETRIQ) 25 MG TB24 tablet Take 1 tablet (25 mg total) by mouth daily. 03/28/21  Yes Estill Dooms, NP  Multiple Vitamin (MULTIVITAMIN) tablet Take 1 tablet by mouth daily.   Yes [provider]  Multiple Vitamins-Minerals (ZINC PO) Take by mouth.   Yes [provider]  nitroGLYCERIN (NITROSTAT) 0.4 MG SL tablet Place 1 tablet (0.4 mg total) under the  tongue every 5 (five) minutes as needed for chest pain (up to 3 doses). 11/28/16  Yes Satira Sark, MD  pantoprazole (PROTONIX) 40 MG tablet Take 40 mg by mouth 2 (two) times daily.    Yes [provider]  Peppermint Oil (IBGARD PO) Take 1 tablet by mouth daily as needed (ibs symptoms).   Yes [provider]  pravastatin (PRAVACHOL) 20 MG tablet TAKE 1 TABLET BY MOUTH ONCE DAILY . APPOINTMENT REQUIRED FOR FUTURE REFILLS Patient taking differently: Take 20 mg by mouth daily. 03/15/21  Yes Satira Sark, MD  Probiotic Product (PROBIOTIC-10) CHEW Chew 2 tablets by mouth daily.   Yes [provider]  Wheat Dextrin (BENEFIBER PO) Take 1 packet by mouth daily as needed (constipation).    Yes [provider]  polyethylene glycol powder (GLYCOLAX/MIRALAX) powder Take 8.5 g by mouth daily. Patient  not taking: No sig reported 10/30/18   Rogene Houston, MD    Inpatient Medications: Scheduled Meds:  amLODipine  5 mg Oral Daily   ascorbic acid  500 mg Oral Daily   Chlorhexidine Gluconate Cloth  6 each Topical Daily   diazepam  1 mg Oral See admin instructions   docusate sodium  100 mg Oral Daily   enoxaparin (LOVENOX) injection  40 mg Subcutaneous Q24H   isosorbide mononitrate  30 mg Oral Daily   losartan  50 mg Oral Daily   metoCLOPramide (REGLAN) injection  10 mg Intravenous Q8H   mirabegron ER  25 mg Oral Daily   multivitamin with minerals  1 tablet Oral Daily   pantoprazole  40 mg Oral BID   pravastatin  20 mg Oral Daily   sucralfate  1 g Oral TID WC & HS   Continuous Infusions:  0.9 % NaCl with KCl 40 mEq / L     PRN Meds: acetaminophen, HYDROmorphone (DILAUDID) injection, nitroGLYCERIN, ondansetron (ZOFRAN) IV, prochlorperazine  Allergies:    Allergies  Allergen Reactions   Diclofenac Sodium Anaphylaxis    Tolerates aspirin - was told not to take antiinflammatory meds.   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  Tobacco Use   Smoking status: Former    Packs/day: 0.50    Years: 23.00    Pack years: 11.50    Types: Cigarettes    Quit date: 12/12/2011    Years since quitting: 9.3   Smokeless tobacco: Never  Vaping Use   Vaping Use: Never used  Substance and Sexual Activity   Alcohol use: No    Alcohol/week: 0.0 standard drinks   Drug use: No   Sexual activity: Not Currently    Birth control/protection: Post-menopausal, Surgical    Comment: hyst  Other Topics Concern   Not on file  Social History Narrative   ** Merged History Encounter **       Social Determinants of Health   Financial Resource Strain: Low Risk    Difficulty of Paying Living Expenses: Not very hard  Food Insecurity: No Food Insecurity   Worried About Charity fundraiser in the Last Year: Never true   Ran Out of Food in the Last Year: Never true  Transportation Needs: No Transportation Needs   Lack of Transportation (Medical): No   Lack of Transportation (Non-Medical): No  Physical Activity: Insufficiently Active   Days of Exercise per Week: 2 days   Minutes of Exercise per Session: 40 min  Stress: Stress Concern Present   Feeling of Stress : To some extent  Social Connections: Moderately Integrated   Frequency of Communication with Friends and Family: Once a week   Frequency of Social Gatherings with Friends and Family: Twice a week   Attends Religious Services: More than 4 times per year   Active Member of [employer] or Organizations: Yes   Attends Archivist  Meetings: More than 4  times per year   Marital Status: Divorced  Human resources officer Violence: Not At Risk   Fear of Current or Ex-Partner: No   Emotionally Abused: No   Physically Abused: No   Sexually Abused: No    Family History:    Family History  Problem Relation Age of Onset   Coronary artery disease Father 13   Heart attack Father 75   Diabetes Father    Irregular heart beat Mother    Bladder Cancer Mother    Colon cancer Mother    Fibromyalgia Mother    Osteoarthritis Mother    Osteoporosis Mother    Diabetes Sister      ROS:  Please see the history of present illness.   All other ROS reviewed and negative.     Physical Exam/Data:   Vitals:   03/29/21 0830 03/29/21 0845 03/29/21 0930 03/29/21 1033  BP: 110/65 124/66 119/78 (!) 162/67  Pulse: 66 (!) 58 71   Resp: 17 15 13    Temp:    98.4 F (36.9 C)  TempSrc:    Oral  SpO2: 93% 96% 96% 98%  Weight:      Height:    5\' 2"  (1.575 m)    Intake/Output Summary (Last 24 hours) at 03/29/2021 1159 Last data filed at 03/29/2021 0111 Gross per 24 hour  Intake 42.83 ml  Output --  Net 42.83 ml   Last 3 Weights 03/28/2021 03/28/2021 02/23/2021  Weight (lbs) 181 lb 181 lb 184 lb  Weight (kg) 82.101 kg 82.101 kg 83.462 kg     Body mass index is 33.11 kg/m.  General:  Well nourished, well developed female appearing in no acute distress HEENT: normal Lymph: no adenopathy Neck: no JVD Endocrine:  No thryomegaly Vascular: No carotid bruits; FA pulses 2+ bilaterally without bruits  Cardiac:  normal S1, S2; RRR; no murmur. Lungs:  clear to auscultation bilaterally, no wheezing, rhonchi or rales  Abd: soft, nontender, no hepatomegaly  Ext: no pitting edema Musculoskeletal:  No deformities, BUE and BLE strength normal and equal Skin: warm and dry  Neuro:  CNs 2-12 intact, no focal abnormalities noted Psych:  Normal affect   Telemetry:  Telemetry was personally reviewed and demonstrates:  NSR, HR in 60's to 70's.  Occasional PVC's.   Relevant CV Studies:  Cardiac Catheterization: 06/2015 The left ventricular systolic function is normal. No significant CAD.   Continue preventative therapy. Inpatient team to decide whether further testing is needed.  Laboratory Data:  High Sensitivity Troponin:   Recent Labs  Lab 03/28/21 2236 03/29/21 0041  TROPONINIHS 3 6     Chemistry Recent Labs  Lab 03/28/21 2236  NA 140  K 2.8*  CL 110  CO2 22  GLUCOSE 123*  BUN 23  CREATININE 0.78  CALCIUM 7.7*  GFRNONAA >60  ANIONGAP 8    No results for input(s): PROT, ALBUMIN, AST, ALT, ALKPHOS, BILITOT in the last 168 hours. Hematology Recent Labs  Lab 03/28/21 2236  WBC 5.5  RBC 3.86*  HGB 12.1  HCT 36.2  MCV 93.8  MCH 31.3  MCHC 33.4  RDW 12.5  PLT 214   BNPNo results for input(s): BNP, PROBNP in the last 168 hours.  DDimer  Recent Labs  Lab 03/28/21 2235  DDIMER 0.39     Radiology/Studies:  DG Chest 2 View  Result Date: 03/28/2021 CLINICAL DATA:  Chest pain today. EXAM: CHEST - 2 VIEW COMPARISON:  None. FINDINGS: The cardiomediastinal contours are normal. Lingular subsegmental atelectasis or  scarring. Pulmonary vasculature is normal. No consolidation, pleural effusion, or pneumothorax. No acute osseous abnormalities are seen. IMPRESSION: Lingular subsegmental atelectasis or scarring. Electronically Signed   By: Keith Rake M.D.   On: 03/28/2021 23:01     Assessment and Plan:   1. Atypical Chest Pain - Her chest pain overall seems atypical for a cardiac etiology as she describes it as an electric current sensation lasting for seconds at a time following by an aching sensation. No exertional component and symptoms are not associated with positional changes.  - Her Hs Troponin values have been negative thus far. EKG shows NSR, HR 95 with minimal ST elevation along V1 and V2. - Will plan to obtain an echocardiogram to assess LV function and wall motion. Unless significant  abnormalities are noted, would not anticipate further ischemic work-up this admission. Symptoms seem atypical for an arrhythmia as well but would continue to follow on telemetry. If symptoms persist, would further investigate a GI etiology.   2. History of Coronary Vasospasm - She is s/p NSTEMI in 12/2011 with cath showing normal cors, readmitted in 06/2015 with cath showing no significant CAD and felt to be secondary to vasospasm. Enzymes have been negative this admission and she reports her pain does not resemble her prior symptoms.  - Echo pending. Continue PTA Amlodipine 5mg  daily and Imdur 30mg  daily.   3. HTN - Continue PTA Amlodipine 5mg  daily, Losartan 50mg  daily and Imdur 30mg  daily.   4. HLD - She has been continued on PTA Pravastatin 20mg  daily.   5. Hypokalemia - K+ was at 2.8 on admission and she received K-dur 40 mEq daily. Will recheck K+ level with repeat labs.    Risk Assessment/Risk Scores:     HEAR Score (for undifferentiated chest pain):  HEAR Score: 3   For questions or updates, please contact [place] Please consult [email or web address] for contact info under    Signed, Erma Heritage, PA-C  03/29/2021 11:59 AM  Patient examined chart reviewed Discussed care with patient and PA Exam with patient nauseated from narcotic. Lungs clear no murmur abdomen soft no edema normal pulses. ECG no acute Troponin negative x 2 CXR atelectasis normal cardiac silhouette and mediastinum  She has nebulous history of "spasm" with clean epicardial vessels. Pain was resting and not helped by nitro Doubt cardiac etiology Will check TTE for effusion and RWMA;s Ok to d/c if normal with outpatient f/u Dr Domenic Polite  Jenkins Rouge MD [facility]

## 2021-03-29 NOTE — Progress Notes (Signed)
*  PRELIMINARY RESULTS* Echocardiogram 2D Echocardiogram has been performed.  Jenna Hernandez 03/29/2021, 2:28 PM

## 2021-03-30 DIAGNOSIS — E782 Mixed hyperlipidemia: Secondary | ICD-10-CM | POA: Diagnosis not present

## 2021-03-30 DIAGNOSIS — R0789 Other chest pain: Secondary | ICD-10-CM | POA: Diagnosis not present

## 2021-03-30 DIAGNOSIS — I10 Essential (primary) hypertension: Secondary | ICD-10-CM | POA: Diagnosis not present

## 2021-03-30 DIAGNOSIS — Z6833 Body mass index (BMI) 33.0-33.9, adult: Secondary | ICD-10-CM

## 2021-03-30 DIAGNOSIS — E876 Hypokalemia: Secondary | ICD-10-CM | POA: Diagnosis not present

## 2021-03-30 DIAGNOSIS — K219 Gastro-esophageal reflux disease without esophagitis: Secondary | ICD-10-CM | POA: Diagnosis not present

## 2021-03-30 DIAGNOSIS — E6609 Other obesity due to excess calories: Secondary | ICD-10-CM

## 2021-03-30 LAB — COMPREHENSIVE METABOLIC PANEL
ALT: 17 U/L (ref 0–44)
AST: 18 U/L (ref 15–41)
Albumin: 3.6 g/dL (ref 3.5–5.0)
Alkaline Phosphatase: 56 U/L (ref 38–126)
Anion gap: 3 — ABNORMAL LOW (ref 5–15)
BUN: 15 mg/dL (ref 8–23)
CO2: 28 mmol/L (ref 22–32)
Calcium: 8.9 mg/dL (ref 8.9–10.3)
Chloride: 110 mmol/L (ref 98–111)
Creatinine, Ser: 0.83 mg/dL (ref 0.44–1.00)
GFR, Estimated: >60 mL/min (ref >60)
Glucose, Bld: 101 mg/dL — ABNORMAL HIGH (ref 70–99)
Potassium: 4.5 mmol/L (ref 3.5–5.1)
Sodium: 141 mmol/L (ref 135–145)
Total Bilirubin: 0.5 mg/dL (ref 0.3–1.2)
Total Protein: 6.3 g/dL — ABNORMAL LOW (ref 6.5–8.1)

## 2021-03-30 LAB — CBC
HCT: 39.1 % (ref 36.0–46.0)
Hemoglobin: 12.5 g/dL (ref 12.0–15.0)
MCH: 31.5 pg (ref 26.0–34.0)
MCHC: 32 g/dL (ref 30.0–36.0)
MCV: 98.5 fL (ref 80.0–100.0)
Platelets: 219 10*3/uL (ref 150–400)
RBC: 3.97 MIL/uL (ref 3.87–5.11)
RDW: 12.7 % (ref 11.5–15.5)
WBC: 7.2 10*3/uL (ref 4.0–10.5)
nRBC: 0 % (ref 0.0–0.2)

## 2021-03-30 LAB — HIV ANTIBODY (ROUTINE TESTING W REFLEX): HIV Screen 4th Generation wRfx: NONREACTIVE

## 2021-03-30 LAB — PROTIME-INR
INR: 1 (ref 0.8–1.2)
Prothrombin Time: 13.2 seconds (ref 11.4–15.2)

## 2021-03-30 LAB — APTT: aPTT: 30 seconds (ref 24–36)

## 2021-03-30 LAB — PHOSPHORUS: Phosphorus: 2.3 mg/dL — ABNORMAL LOW (ref 2.5–4.6)

## 2021-03-30 LAB — MAGNESIUM: Magnesium: 2.4 mg/dL (ref 1.7–2.4)

## 2021-03-30 MED ORDER — FAMOTIDINE 20 MG PO TABS: 20.0000 mg | ORAL_TABLET | Freq: Every day | ORAL | 1 refills | Status: DC

## 2021-03-30 MED ORDER — ONDANSETRON 8 MG PO TBDP: 8.0000 mg | ORAL_TABLET | Freq: Three times a day (TID) | ORAL | 0 refills | Status: AC | PRN

## 2021-03-30 NOTE — Progress Notes (Signed)
    Spoke with Dr. Dyann Kief who states he plans to discharge the patient later today. Refer to full consult note on 03/29/2021. Repeat Hs Troponin values have been negative and echocardiogram this admission shows a preserved EF of 60-65% with no regional WMA. Noted to have a trivial effusion (symptoms not typical for pericarditis) and no significant valve abnormalities. Will arrange for outpatient Cardiology follow-up.   Signed, Erma Heritage, PA-C 03/30/2021, 9:24 AM Pager: [phone]

## 2021-03-30 NOTE — Discharge Summary (Signed)
Physician Discharge Summary  Jenna Hernandez UUV:[id number] DOB: 02-19-1951 DOA: 03/28/2021  PCP: Manon Hilding, MD  Admit date: 03/28/2021 Discharge date: 03/30/2021  Time spent: 35 minutes  Recommendations for Outpatient Follow-up:  Repeat basic metabolic panel to follow electrolytes and renal function. Reassess blood pressure and adjust antihypertensive treatment as needed. Continue to follow patient response to medication adjustment and lifestyle changes as part of treatment response for GERD.   Discharge Diagnoses:  Active Problems:   CAD (coronary artery disease)   GERD (gastroesophageal reflux disease)   Coronary vasospasm (HCC)   Obesity   Essential hypertension   Atypical chest pain   Hypokalemia   Hyperlipidemia   Discharge Condition: Stable and improved.  Discharged home with instruction to follow-up with PCP and cardiology service and outpatient.  CODE STATUS: Full code.  Diet recommendation: Heart healthy diet.  Filed Weights   03/28/21 2216  Weight: 82.1 kg    History of present illness:  As per H&P written by Dr. Josephine Cables 03/29/2021 Jenna Hernandez is a 70 y.o. female with medical history significant for CAD, fibromyalgia, coronary vasospasm, essential hypertension, GERD, hyperlipidemia and obesity who presents to the emergency department accompanied by brother for the evaluation of chest pain which started few hours prior to presenting to the ED.  Patient states that she was just sitting down at home watching TV when she suddenly felt a low of mid sternal pain was nonreproducible and was described as pressure which was rated as 8/10 on pain scale.  She took 1 nitroglycerin and 4 baby aspirin at home without any relief, pain was intermittent, she thought it might be due to indigestion, so she took Mylanta which did not alleviate the chest pain, she complained of headache which she thought was due to the nitroglycerin taken, EMS was activated and patient was taken to  the ED for further evaluation and management.  Patient endorsed history of heart attack in 2013.  She denies fever, chills, nausea, vomiting or abdominal pain.   ED Course:  In the emergency department, she was hemodynamically stable, O2 sat was above 90s on room air.  In the ED shows normal CBC and BMP showed hypokalemia and mild hyperglycemia.  D-dimer was 0.39.  Influenza A, B, SARS coronavirus 2 was negative.  Chest x-ray showed lingular subsegmental atelectasis or scarring. GI cocktail, Pepcid, Dilaudid were given.  Hospitalist was asked to admit patient for further evaluation and management.  Hospital Course:  1-atypical chest pain -Noncardiac in etiology -Patient is no longer experiencing any chest discomfort at this point. -EKG and telemetry without acute ischemic change -Troponin negative x2 and 2D echo reassuring with no wall motion abnormalities and preserved ejection fraction. -Cardiology service recommendations appreciated; patient will continue to follow as previously scheduled as an outpatient and will continue treatment with aspirin, Pravachol, Losartan and Imdur.  2-essential hypertension -Stable on well-controlled -Continue current antihypertensive regimen -Patient advised to follow heart healthy diet.  3-history of anxiety -Continue Valium.  4-gastroesophageal reflux disease -Continue PPI twice a day and famotidine nightly. -Lifestyle changes discussion and recommendations provided.  5-history of overreactive bladder -Continue Myrbetriq.  6-class I obesity -Patient has been counseled on low calorie diet, portion control and increase physical activity. -Body mass index is 33.11 kg/m.  7-hypokalemia -In the setting of GI losses and decreased oral intake-repleted and within normal limits at discharge  Procedures: See below for x-ray reports. 2D echo: preserved ejection fraction, no wall motion abnormalities.  No significant valvular  disorder.  Consultations: Cardiology service  Discharge Exam: Vitals:   03/30/21 0652 03/30/21 0654  BP: (!) 113/50   Pulse:    Resp:    Temp:  98.1 F (36.7 C)  SpO2:      General: No chest pain, no nausea, no vomiting, tolerating diet and in no acute distress.  Would like to go home. Cardiovascular: S1-S2, no rubs, no gallops, no JVD. Respiratory: Clear to auscultation bilaterally; no using accessory muscle. Abdomen: Soft, nontender, distended, positive bowel sounds Extremities: No cyanosis or clubbing.  Discharge Instructions   Discharge Instructions     Diet - low sodium heart healthy   Complete by: As directed    Discharge instructions   Complete by: As directed    Take medications as prescribed Maintain adequate hydration  Follow heart healthy diet Arrange follow up with PCP in 10 days Practice lifestyle changes for GERD as discussed.      Allergies as of 03/30/2021       Reactions   Diclofenac Sodium Anaphylaxis   Tolerates aspirin - was told not to take antiinflammatory meds.   Nsaids Anaphylaxis   Meperidine-promethazine Other (See Comments)   headache   Ramipril Other (See Comments)   cough   Raspberry Hives   Codeine Rash   Prednisone Palpitations   Sulfa Antibiotics Rash        Medication List     STOP taking these medications    IBGARD PO   polyethylene glycol powder 17 GM/SCOOP powder Commonly known as: GLYCOLAX/MIRALAX       TAKE these medications    acetaminophen 500 MG tablet Commonly known as: TYLENOL Take 1,000 mg by mouth See admin instructions. Take 1000 mg at bedtime, may take an additional 1000 mg dose daily as needed for pain   amLODipine 5 MG tablet Commonly known as: NORVASC Take 1 tablet by mouth once daily   ascorbic acid 500 MG tablet Commonly known as: VITAMIN C Take 500 mg by mouth daily.   aspirin EC 81 MG tablet Take 81 mg by mouth at bedtime.   BENEFIBER PO Take 1 packet by mouth daily as needed  (constipation).   diazepam 2 MG tablet Commonly known as: VALIUM Take 1 mg by mouth See admin instructions. Take 1 mg at bedtime, may take an additional 1 mg dose daily as needed for anxiety   docusate sodium 50 MG capsule Commonly known as: COLACE Take 50 mg by mouth daily.   famotidine 20 MG tablet Commonly known as: Pepcid Take 1 tablet (20 mg total) by mouth at bedtime.   isosorbide mononitrate 30 MG 24 hr tablet Commonly known as: IMDUR Take 1 tablet by mouth once daily   losartan 50 MG tablet Commonly known as: COZAAR Take 50 mg by mouth daily.   mirabegron ER 25 MG Tb24 tablet Commonly known as: Myrbetriq Take 1 tablet (25 mg total) by mouth daily.   multivitamin tablet Take 1 tablet by mouth daily.   nitroGLYCERIN 0.4 MG SL tablet Commonly known as: NITROSTAT Place 1 tablet (0.4 mg total) under the tongue every 5 (five) minutes as needed for chest pain (up to 3 doses).   ondansetron 8 MG disintegrating tablet Commonly known as: Zofran ODT Take 1 tablet (8 mg total) by mouth every 8 (eight) hours as needed for nausea or vomiting.   pantoprazole 40 MG tablet Commonly known as: PROTONIX Take 40 mg by mouth 2 (two) times daily.   pravastatin 20 MG tablet Commonly known as: PRAVACHOL TAKE  1 TABLET BY MOUTH ONCE DAILY . APPOINTMENT REQUIRED FOR FUTURE REFILLS What changed: See the new instructions.   Probiotic-10 Chew Chew 2 tablets by mouth daily.   ZINC PO Take by mouth.       Allergies  Allergen Reactions   Diclofenac Sodium Anaphylaxis    Tolerates aspirin - was told not to take antiinflammatory meds.   Nsaids Anaphylaxis   Meperidine-Promethazine Other (See Comments)    headache   Ramipril Other (See Comments)    cough   Raspberry Hives   Codeine Rash   Prednisone Palpitations   Sulfa Antibiotics Rash    Follow-up Information     Verta Ellen., NP Follow up on 05/04/2021.   Specialty: Cardiology Why: Cardiology Hospital Follow-up on  05/04/2021 at 1:30 PM with Katina Dung, NP (works with Dr. Domenic Polite) Contact information: [place] [address] [phone]         Manon Hilding, MD. Schedule an appointment as soon as possible for a visit in 10 day(s).   Specialty: Family Medicine Contact information: [place] [address] [phone]         Satira Sark, MD .   Specialty: Cardiology Contact information: [place] [address] [phone]                  The results of significant diagnostics from this hospitalization (including imaging, microbiology, ancillary and laboratory) are listed below for reference.    Significant Diagnostic Studies: DG Chest 2 View  Result Date: 03/28/2021 CLINICAL DATA:  Chest pain today. EXAM: CHEST - 2 VIEW COMPARISON:  None. FINDINGS: The cardiomediastinal contours are normal. Lingular subsegmental atelectasis or scarring. Pulmonary vasculature is normal. No consolidation, pleural effusion, or pneumothorax. No acute osseous abnormalities are seen. IMPRESSION: Lingular subsegmental atelectasis or scarring. Electronically Signed   By: Keith Rake M.D.   On: 03/28/2021 23:01   ECHOCARDIOGRAM COMPLETE  Result Date: 03/29/2021    ECHOCARDIOGRAM REPORT   Patient Name:   Jenna Hernandez Date of Exam: 03/29/2021 Medical Rec #:  [id number]       Height:       62.0 in Accession #:    [id number]      Weight:       181.0 lb Date of Birth:  1951/01/01       BSA:          1.832 m Patient Age:    89 years        BP:           113/49 mmHg Patient Gender: F               HR:           61 bpm. Exam Location:  [facility] Procedure: 2D Echo, Cardiac Doppler and Color Doppler Indications:    Chest Pain R07.9  History:        Patient has prior history of Echocardiogram examinations, most                 recent 08/11/2010. CAD, Signs/Symptoms:Chest Pain; Risk                 Factors:Current Smoker and Hypertension.  Sonographer:     Wenda Low Referring Phys: [place]  1. Left ventricular ejection fraction, by estimation, is 60 to 65%. The left ventricle has normal function. The left ventricle has no regional wall  motion abnormalities. Left ventricular diastolic parameters were normal.  2. Right ventricular systolic function is normal. The right ventricular size is normal.  3. Left atrial size was moderately dilated.  4. The pericardial effusion is posterior to the left ventricle and anterior to the right ventricle.  5. The mitral valve is normal in structure. Trivial mitral valve regurgitation. No evidence of mitral stenosis.  6. The aortic valve is tricuspid. Aortic valve regurgitation is not visualized. No aortic stenosis is present.  7. The inferior vena cava is normal in size with greater than 50% respiratory variability, suggesting right atrial pressure of 3 mmHg. FINDINGS  Left Ventricle: Left ventricular ejection fraction, by estimation, is 60 to 65%. The left ventricle has normal function. The left ventricle has no regional wall motion abnormalities. The left ventricular internal cavity size was normal in size. There is  no left ventricular hypertrophy. Left ventricular diastolic parameters were normal. Right Ventricle: The right ventricular size is normal. No increase in right ventricular wall thickness. Right ventricular systolic function is normal. Left Atrium: Left atrial size was moderately dilated. Right Atrium: Right atrial size was normal in size. Pericardium: Trivial pericardial effusion is present. The pericardial effusion is posterior to the left ventricle and anterior to the right ventricle. Mitral Valve: The mitral valve is normal in structure. Trivial mitral valve regurgitation. No evidence of mitral valve stenosis. MV peak gradient, 4.2 mmHg. The mean mitral valve gradient is 1.0 mmHg. Tricuspid Valve: The tricuspid valve is normal in structure. Tricuspid valve regurgitation is trivial. No  evidence of tricuspid stenosis. Aortic Valve: The aortic valve is tricuspid. Aortic valve regurgitation is not visualized. No aortic stenosis is present. Aortic valve mean gradient measures 4.0 mmHg. Aortic valve peak gradient measures 9.1 mmHg. Aortic valve area, by VTI measures 3.25 cm. Pulmonic Valve: The pulmonic valve was normal in structure. Pulmonic valve regurgitation is not visualized. No evidence of pulmonic stenosis. Aorta: The aortic root is normal in size and structure. Venous: The inferior vena cava is normal in size with greater than 50% respiratory variability, suggesting right atrial pressure of 3 mmHg. IAS/Shunts: No atrial level shunt detected by color flow Doppler.  LEFT VENTRICLE PLAX 2D LVIDd:         4.51 cm  Diastology LVIDs:         2.23 cm  LV e' medial:    8.24 cm/s LV PW:         1.04 cm  LV E/e' medial:  10.5 LV IVS:        1.06 cm  LV e' lateral:   7.42 cm/s LVOT diam:     2.00 cm  LV E/e' lateral: 11.7 LV SV:         101 LV SV Index:   55 LVOT Area:     3.14 cm  RIGHT VENTRICLE RV S prime:     16.40 cm/s TAPSE (M-mode): 2.1 cm LEFT ATRIUM             Index       RIGHT ATRIUM           Index LA diam:        4.50 cm 2.46 cm/m  RA Area:     13.20 cm 7.20 cm/m LA Vol (A2C):   51.4 ml 28.05 ml/m LA Vol (A4C):   45.5 ml 24.83 ml/m LA Biplane Vol: 49.1 ml 26.80 ml/m  AORTIC VALVE AV Area (Vmax):    2.52 cm AV Area (Vmean):   2.96  cm AV Area (VTI):     3.25 cm AV Vmax:           151.00 cm/s AV Vmean:          88.900 cm/s AV VTI:            0.309 m AV Peak Grad:      9.1 mmHg AV Mean Grad:      4.0 mmHg LVOT Vmax:         121.00 cm/s LVOT Vmean:        83.800 cm/s LVOT VTI:          0.320 m LVOT/AV VTI ratio: 1.04  AORTA Ao Root diam: 2.40 cm Ao Asc diam:  3.10 cm MITRAL VALVE MV Area (PHT): 3.23 cm    SHUNTS MV Area VTI:   2.56 cm    Systemic VTI:  0.32 m MV Peak grad:  4.2 mmHg    Systemic Diam: 2.00 cm MV Mean grad:  1.0 mmHg MV Vmax:       1.02 m/s MV Vmean:      45.5 cm/s MV  Decel Time: 235 msec MV E velocity: 86.50 cm/s MV A velocity: 99.70 cm/s MV E/A ratio:  0.87 Jenkins Rouge MD Electronically signed by Jenkins Rouge MD Signature Date/Time: 03/29/2021/3:53:25 PM    Final     Microbiology: Recent Results (from the past 240 hour(s))  Resp Panel by RT-PCR (Flu A&B, Covid) Nasopharyngeal Swab     Status: None   Collection Time: 03/29/21  1:33 AM   Specimen: Nasopharyngeal Swab; Nasopharyngeal(NP) swabs in vial transport medium  Result Value Ref Range Status   SARS Coronavirus 2 by RT PCR NEGATIVE NEGATIVE Final    Comment: (NOTE) SARS-CoV-2 target nucleic acids are NOT DETECTED.  The SARS-CoV-2 RNA is generally detectable in upper respiratory specimens during the acute phase of infection. The lowest concentration of SARS-CoV-2 viral copies this assay can detect is 138 copies/mL. A negative result does not preclude SARS-Cov-2 infection and should not be used as the sole basis for treatment or other patient management decisions. A negative result may occur with  improper specimen collection/handling, submission of specimen other than nasopharyngeal swab, presence of viral mutation(s) within the areas targeted by this assay, and inadequate number of viral copies(<138 copies/mL). A negative result must be combined with clinical observations, patient history, and epidemiological information. The expected result is Negative.  Fact Sheet for Patients:  EntrepreneurPulse.com.au  Fact Sheet for Healthcare Providers:  IncredibleEmployment.be  This test is no t yet approved or cleared by the [place] FDA and  has been authorized for detection and/or diagnosis of SARS-CoV-2 by FDA under an Emergency Use Authorization (EUA). This EUA will remain  in effect (meaning this test can be used) for the duration of the COVID-19 declaration under Section 564(b)(1) of the Act, 21 U.S.C.section 360bbb-3(b)(1), unless the authorization is  terminated  or revoked sooner.       Influenza A by PCR NEGATIVE NEGATIVE Final   Influenza B by PCR NEGATIVE NEGATIVE Final    Comment: (NOTE) The Xpert Xpress SARS-CoV-2/FLU/RSV plus assay is intended as an aid in the diagnosis of influenza from Nasopharyngeal swab specimens and should not be used as a sole basis for treatment. Nasal washings and aspirates are unacceptable for Xpert Xpress SARS-CoV-2/FLU/RSV testing.  Fact Sheet for Patients: EntrepreneurPulse.com.au  Fact Sheet for Healthcare Providers: IncredibleEmployment.be  This test is not yet approved or cleared by the [place] and has been authorized  for detection and/or diagnosis of SARS-CoV-2 by FDA under an Emergency Use Authorization (EUA). This EUA will remain in effect (meaning this test can be used) for the duration of the COVID-19 declaration under Section 564(b)(1) of the Act, 21 U.S.C. section 360bbb-3(b)(1), unless the authorization is terminated or revoked.  Performed at [facility], [address], [place], [place] [address]   MRSA Next Gen by PCR, Nasal     Status: None   Collection Time: 03/29/21 10:32 AM   Specimen: Nasal Mucosa; Nasal Swab  Result Value Ref Range Status   MRSA by PCR Next Gen NOT DETECTED NOT DETECTED Final    Comment: (NOTE) The GeneXpert MRSA Assay (FDA approved for NASAL specimens only), is one component of a comprehensive MRSA colonization surveillance program. It is not intended to diagnose MRSA infection nor to guide or monitor treatment for MRSA infections. Test performance is not FDA approved in patients less than 7 years old. Performed at [facility], [address], [place], [place] [address]      Labs: Basic Metabolic Panel: Recent Labs  Lab 03/28/21 2236 03/29/21 1229 03/30/21 0520  NA 140 139 141  K 2.8* 4.3 4.5  CL 110 104 110  CO2 22 28 28   GLUCOSE 123* 156* 101*  BUN 23 22 15   CREATININE 0.78 0.87 0.83   CALCIUM 7.7* 9.4 8.9  MG  --   --  2.4  PHOS  --   --  2.3*   Liver Function Tests: Recent Labs  Lab 03/30/21 0520  AST 18  ALT 17  ALKPHOS 56  BILITOT 0.5  PROT 6.3*  ALBUMIN 3.6    CBC: Recent Labs  Lab 03/28/21 2236 03/30/21 0520  WBC 5.5 7.2  HGB 12.1 12.5  HCT 36.2 39.1  MCV 93.8 98.5  PLT 214 219    Signed:  Barton Dubois MD.  [facility] 03/30/2021, 9:31 AM

## 2021-04-11 ENCOUNTER — Other Ambulatory Visit: Payer: Self-pay | Admitting: Cardiology

## 2021-04-11 ENCOUNTER — Telehealth: Payer: Self-pay | Admitting: Cardiology

## 2021-04-11 NOTE — Telephone Encounter (Signed)
New message    Patient was in hospital and they want to change her medication , she doesn't feel comfortable changing her medication without talk to nurse or Dr Domenic Polite

## 2021-04-11 NOTE — Telephone Encounter (Signed)
Pt voiced understanding

## 2021-04-11 NOTE — Telephone Encounter (Signed)
    They are both calcium channel blockers and used for the treatment of coronary vasospasms. It is reasonable to make this switch to see if it helps with her symptoms. Would not take BOTH at the same time. Would encourage her to follow HR/BP at home if able as Cardizem can lower HR.   Signed, Erma Heritage, PA-C 04/11/2021, 11:14 AM Pager: [phone]

## 2021-04-11 NOTE — Telephone Encounter (Signed)
Pt concerned that Dr. Quintin Alto wants to changed her amlodipine 5 mg tablets for diltiazem following her recent ER admittance. Pt would like to know if this is better for her. Please advise.

## 2021-04-29 DIAGNOSIS — E7849 Other hyperlipidemia: Secondary | ICD-10-CM | POA: Diagnosis not present

## 2021-04-29 DIAGNOSIS — N183 Chronic kidney disease, stage 3 unspecified: Secondary | ICD-10-CM | POA: Diagnosis not present

## 2021-04-29 DIAGNOSIS — I209 Angina pectoris, unspecified: Secondary | ICD-10-CM | POA: Diagnosis not present

## 2021-04-29 DIAGNOSIS — R1013 Epigastric pain: Secondary | ICD-10-CM | POA: Diagnosis not present

## 2021-04-29 DIAGNOSIS — M7550 Bursitis of unspecified shoulder: Secondary | ICD-10-CM | POA: Diagnosis not present

## 2021-05-03 DIAGNOSIS — M7550 Bursitis of unspecified shoulder: Secondary | ICD-10-CM | POA: Diagnosis not present

## 2021-05-03 NOTE — Progress Notes (Deleted)
Cardiology Office Note  Date: 05/03/2021   ID: Jenna Hernandez, DOB [birth year], MRN [id number]  PCP:  Manon Hilding, MD  Cardiologist:  Rozann Lesches, MD Electrophysiologist:  None   Chief Complaint: Hospital follow-up  History of Present Illness: Jenna Hernandez is a 70 y.o. female with a history of CAD, NSTEMI, HTN, HLD, obesity, PUD, pulmonary nodules.  She was last seen by Dr. Domenic Polite on 02/23/2021 for routine visit.  She had no anginal symptoms on current medications.  Her losartan has been uptitrated by Dr. Quintin Alto for better blood pressure control.  Her coronary vasospasms were clinically stable on medical therapy.  She was continuing Norvasc, Imdur, losartan, Pravachol.  Recent hospital admission on 03/28/2021 secondary to chest pain.  She took 1 sublingual nitroglycerin and 4 baby aspirin at home without relief.  She was admitted for further evaluation.  Troponins were negative x2.  2D echo was reassuring with no wall motion abnormalities and preserved EF.  She was ruled out for ACS. Her hypokalemia was repleted.  Blood pressure was well controlled on current antihypertensive regimen.  Repeat BMP, Recheck BP and adjust meds as needed.   Past Medical History:  Diagnosis Date   Antral gastritis    a. EGD 06/2015   Anxiety    CAD (coronary artery disease)    a. NSTEMI 12/2011 - cath smooth/normal cors, ? vasospasm with thrombosis given HK in inferior wall - readmitted same month - MD reviewed films and felt she may have had small branch occlusion off RCA accounting for WMA. b. Subsequent workups in 2014, 2015 normal. c. Readmitted 06/2015 with CP/+ troponin - cath showed no significant CAD, LVEF 55-65%   Chronic back pain    Coronary vasospasm (HCC)    Dermatitis    Diverticulosis    Essential hypertension    Fibromyalgia    GERD (gastroesophageal reflux disease)    H/O hiatal hernia    Hypercholesterolemia    Myocardial infarction ([place])    Obesity    Osteoarthritis     PUD (peptic ulcer disease)    a. EGD 06/2015   Pulmonary nodules    a. Incidentally noted on CT by GI 06/2015    Past Surgical History:  Procedure Laterality Date   CARDIAC CATHETERIZATION  12/2011   CARDIAC CATHETERIZATION N/A 06/09/2015   Procedure: Left Heart Cath and Coronary Angiography;  Surgeon: Jettie Booze, MD;  Location: [place] [facility];  Service: Cardiovascular;  Laterality: N/A;   COLONOSCOPY  08/2013   [facility] /Dr.Benson   COLONOSCOPY N/A 10/30/2018   Procedure: COLONOSCOPY;  Surgeon: Rogene Houston, MD;  [facility];  Service: Endoscopy;  Laterality: N/A;  1200   CYSTECTOMY  ?[id number]   "from my back"   ESOPHAGOGASTRODUODENOSCOPY N/A 06/10/2015   Procedure: ESOPHAGOGASTRODUODENOSCOPY (EGD);  Surgeon: Rogene Houston, MD;  [facility];  Service: Endoscopy;  Laterality: N/A;  140   INGUINAL HERNIA REPAIR Right ~ [place]  ~ 2004   LEFT HEART CATHETERIZATION WITH CORONARY ANGIOGRAM N/A 12/13/2011   Procedure: LEFT HEART CATHETERIZATION WITH CORONARY ANGIOGRAM;  Surgeon: Thayer Headings, MD;  Location: [facility] C[facility];  Service: Cardiovascular;  Laterality: N/A;   TUBAL LIGATION  1979   UPPER GASTROINTESTINAL ENDOSCOPY  09/2012   [facility]/Dr.Benson   VAGINAL HYSTERECTOMY  1990    Current Outpatient Medications  Medication Sig Dispense Refill   acetaminophen (TYLENOL) 500 MG tablet Take 1,000 mg by mouth See admin  instructions. Take 1000 mg at bedtime, may take an additional 1000 mg dose daily as needed for pain     amLODipine (NORVASC) 5 MG tablet Take 1 tablet by mouth once daily 90 tablet 3   ascorbic acid (VITAMIN C) 500 MG tablet Take 500 mg by mouth daily.     aspirin EC 81 MG tablet Take 81 mg by mouth at bedtime.     diazepam (VALIUM) 2 MG tablet Take 1 mg by mouth See admin instructions. Take 1 mg at bedtime, may take an additional 1 mg dose daily as needed for anxiety     docusate sodium (COLACE) 50 MG capsule Take 50 mg by  mouth daily.     famotidine (PEPCID) 20 MG tablet Take 1 tablet (20 mg total) by mouth at bedtime. 30 tablet 1   isosorbide mononitrate (IMDUR) 30 MG 24 hr tablet Take 1 tablet by mouth once daily 90 tablet 2   losartan (COZAAR) 50 MG tablet Take 50 mg by mouth daily.     mirabegron ER (MYRBETRIQ) 25 MG TB24 tablet Take 1 tablet (25 mg total) by mouth daily. 56 tablet 0   Multiple Vitamin (MULTIVITAMIN) tablet Take 1 tablet by mouth daily.     Multiple Vitamins-Minerals (ZINC PO) Take by mouth.     nitroGLYCERIN (NITROSTAT) 0.4 MG SL tablet Place 1 tablet (0.4 mg total) under the tongue every 5 (five) minutes as needed for chest pain (up to 3 doses). 25 tablet 3   ondansetron (ZOFRAN ODT) 8 MG disintegrating tablet Take 1 tablet (8 mg total) by mouth every 8 (eight) hours as needed for nausea or vomiting. 20 tablet 0   pantoprazole (PROTONIX) 40 MG tablet Take 40 mg by mouth 2 (two) times daily.      pravastatin (PRAVACHOL) 20 MG tablet TAKE 1 TABLET BY MOUTH ONCE DAILY . APPOINTMENT REQUIRED FOR FUTURE REFILLS 90 tablet 2   Probiotic Product (PROBIOTIC-10) CHEW Chew 2 tablets by mouth daily.     Wheat Dextrin (BENEFIBER PO) Take 1 packet by mouth daily as needed (constipation).      No current facility-administered medications for this visit.   Allergies:  Diclofenac sodium, Nsaids, Meperidine-promethazine, Ramipril, Raspberry, Codeine, Prednisone, and Sulfa antibiotics   Social History: The patient  reports that she quit smoking about 9 years ago. Her smoking use included cigarettes. She has a 11.50 pack-year smoking history. She has never used smokeless tobacco. She reports that she does not drink alcohol and does not use drugs.   Family History: The patient's family history includes Bladder Cancer in her mother; Colon cancer in her mother; Coronary artery disease (age of onset: 71) in her father; Diabetes in her father and sister; Fibromyalgia in her mother; Heart attack (age of onset: 70) in  her father; Irregular heart beat in her mother; Osteoarthritis in her mother; Osteoporosis in her mother.   ROS:  Please see the history of present illness. Otherwise, complete review of systems is positive for {NONE DEFAULTED:18576}.  All other systems are reviewed and negative.   Physical Exam: VS:  There were no vitals taken for this visit., BMI There is no height or weight on file to calculate BMI.  Wt Readings from Last 3 Encounters:  03/28/21 181 lb (82.1 kg)  03/28/21 181 lb (82.1 kg)  02/23/21 184 lb (83.5 kg)    General: Patient appears comfortable at rest. HEENT: Conjunctiva and lids normal, oropharynx clear with moist mucosa. Neck: Supple, no elevated JVP or carotid bruits,  no thyromegaly. Lungs: Clear to auscultation, nonlabored breathing at rest. Cardiac: Regular rate and rhythm, no S3 or significant systolic murmur, no pericardial rub. Abdomen: Soft, nontender, no hepatomegaly, bowel sounds present, no guarding or rebound. Extremities: No pitting edema, distal pulses 2+. Skin: Warm and dry. Musculoskeletal: No kyphosis. Neuropsychiatric: Alert and oriented x3, affect grossly appropriate.  ECG:  {EKG/Telemetry Strips Reviewed:[id number]}  Recent Labwork: 03/30/2021: ALT 17; AST 18; BUN 15; Creatinine, Ser 0.83; Hemoglobin 12.5; Magnesium 2.4; Platelets 219; Potassium 4.5; Sodium 141     Component Value Date/Time   CHOL 163 06/09/2015 0105   TRIG 62 06/09/2015 0105   HDL 66 06/09/2015 0105   CHOLHDL 2.5 06/09/2015 0105   VLDL 12 06/09/2015 0105   [place] 85 06/09/2015 0105    Other Studies Reviewed Today:   Echocardiogram 03/29/2021  1. Left ventricular ejection fraction, by estimation, is 60 to 65%. The left ventricle has normal function. The left ventricle has no regional wall motion abnormalities. Left ventricular diastolic parameters were normal. 2. Right ventricular systolic function is normal. The right ventricular size is normal. 3. Left atrial size was  moderately dilated. 4. The pericardial effusion is posterior to the left ventricle and anterior to the right ventricle. 5. The mitral valve is normal in structure. Trivial mitral valve regurgitation. No evidence of mitral stenosis. 6. The aortic valve is tricuspid. Aortic valve regurgitation is not visualized. No aortic stenosis is present. 7. The inferior vena cava is normal in size with greater than 50% respiratory variability, suggesting right atrial pressure of 3 mmHg.       Cardiac Catheterization 06/09/2015 Conclusion  The left ventricular systolic function is normal. No significant CAD.   Continue preventative therapy. Inpatient team to decide whether further testing is needed.     Assessment and Plan:  1. Chest pain, unspecified type   2. Essential hypertension   3. Mixed hyperlipidemia   4. Coronary vasospasm (HCC)      Medication Adjustments/Labs and Tests Ordered: Current medicines are reviewed at length with the patient today.  Concerns regarding medicines are outlined above.   Disposition: Follow-up with ***  Signed, Levell July, NP 05/03/2021 7:08 PM    [place] at [facility] [address], [place], [place] [address] Phone: [phone]; Fax: [phone]

## 2021-05-04 ENCOUNTER — Ambulatory Visit: Payer: Medicare Other | Admitting: Family Medicine

## 2021-05-04 DIAGNOSIS — I10 Essential (primary) hypertension: Secondary | ICD-10-CM

## 2021-05-04 DIAGNOSIS — I201 Angina pectoris with documented spasm: Secondary | ICD-10-CM

## 2021-05-04 DIAGNOSIS — E782 Mixed hyperlipidemia: Secondary | ICD-10-CM

## 2021-05-04 DIAGNOSIS — R079 Chest pain, unspecified: Secondary | ICD-10-CM

## 2021-05-15 ENCOUNTER — Telehealth: Payer: Self-pay | Admitting: Adult Health

## 2021-05-15 MED ORDER — MIRABEGRON ER 25 MG PO TB24: 25.0000 mg | ORAL_TABLET | Freq: Every day | ORAL | 12 refills | Status: DC

## 2021-05-15 NOTE — Telephone Encounter (Signed)
Pt needs Rx for Myrbetriq sent to [place] gave her samples to start & now needs Rx sent it  Please advise & notify pt

## 2021-05-15 NOTE — Telephone Encounter (Signed)
Says Myrbetriq helped a lot, wants rx sent to [facility] in [place]

## 2021-05-16 DIAGNOSIS — Z1231 Encounter for screening mammogram for malignant neoplasm of breast: Secondary | ICD-10-CM | POA: Diagnosis not present

## 2021-05-26 ENCOUNTER — Other Ambulatory Visit: Payer: Self-pay

## 2021-05-26 ENCOUNTER — Ambulatory Visit (INDEPENDENT_AMBULATORY_CARE_PROVIDER_SITE_OTHER): Payer: Medicare Other | Admitting: Family Medicine

## 2021-05-26 ENCOUNTER — Encounter: Payer: Self-pay | Admitting: Family Medicine

## 2021-05-26 VITALS — BP 126/70 | HR 88 | Ht 62.0 in | Wt 177.2 lb

## 2021-05-26 DIAGNOSIS — E782 Mixed hyperlipidemia: Secondary | ICD-10-CM | POA: Diagnosis not present

## 2021-05-26 DIAGNOSIS — I10 Essential (primary) hypertension: Secondary | ICD-10-CM

## 2021-05-26 DIAGNOSIS — R0789 Other chest pain: Secondary | ICD-10-CM

## 2021-05-26 DIAGNOSIS — K219 Gastro-esophageal reflux disease without esophagitis: Secondary | ICD-10-CM | POA: Diagnosis not present

## 2021-05-26 DIAGNOSIS — E876 Hypokalemia: Secondary | ICD-10-CM | POA: Diagnosis not present

## 2021-05-26 NOTE — Progress Notes (Signed)
Cardiology Office Note  Date: 05/26/2021   ID: Jenna Hernandez, DOB 07-20-1951, MRN [id number]  PCP:  Manon Hilding, MD  Cardiologist:  Rozann Lesches, MD Electrophysiologist:  None   Chief Complaint: Hospital follow-up  History of Present Illness: Jenna Hernandez is a 70 y.o. female with a history of CAD, fibromyalgia, coronary vasospasm, GERD, obesity, NSTEMI, HTN, HLD, obesity, PUD, pulmonary nodules.  She was last seen by Dr. Domenic Polite on 02/23/2021 for routine visit.  She had no anginal symptoms on current medications.  Her losartan had been uptitrated by Dr. Quintin Alto for better blood pressure control.  Her coronary vasospasms were clinically stable on medical therapy.  She was continuing Norvasc, Imdur, losartan, Pravachol.  Recent hospital admission on 03/28/2021 secondary to chest pain.  She took 1 sublingual nitroglycerin and 4 baby aspirin at home without relief.  She was admitted for further evaluation.  Troponins were negative x2.  2D echo was reassuring with no wall motion abnormalities and preserved EF.  She was ruled out for ACS. Her hypokalemia was repleted.  Blood pressure was well controlled on current antihypertensive regimen.  She was continuing PPI twice a day and famotidine nightly for GERD.  She was continuing Myrbetriq for overactive bladder.  She had been counseled on low calorie diet and portion control as well as increased physical activity.  She is here for hospital follow-up today.  She denies any further issues with chest pain.  She states she believes her chest pain may have been due to eating too many tacos that particular day.  She states she has some issues with GERD and believes this may have been the reason for the chest pain.  She states she is trying to watch her diet and control when and how much she eats.  She denies any further anginal or exertional symptoms.  States she has occasional transient palpitations which are nonbothersome.  She denies any  orthostatic symptoms, CVA or TIA-like symptoms, palpitations or arrhythmias.  She states she does have fibromyalgia and has symptoms of pain in her neck at times and in her shoulders.  Her cardiac catheterization in 2016 showed no evidence of CAD.  She had an echocardiogram during hospital stay demonstrating EF of 60 to 65%.  No WMA's.  Normal diastolic parameters.  Moderately dilated LA, pericardial effusion posterior to the LV and anterior tear RV.  Trivial MR.  She states her primary believes she may have had some esophageal spasms.  Apparently she has history of coronary artery vasospasms also.  Her current cardiac regimen includes amlodipine 5 mg daily, aspirin 81 mg daily, Imdur 30 mg daily, losartan 50 mg daily, sublingual nitroglycerin as needed, pravastatin 20 mg daily.  She was apparently hypokalemic during hospital stay which was repleted.  Discharge note requests a follow-up BMP to recheck.  Blood pressure is well controlled today at 126/70   Past Medical History:  Diagnosis Date   Antral gastritis    a. EGD 06/2015   Anxiety    CAD (coronary artery disease)    a. NSTEMI 12/2011 - cath smooth/normal cors, ? vasospasm with thrombosis given HK in inferior wall - readmitted same month - MD reviewed films and felt she may have had small branch occlusion off RCA accounting for WMA. b. Subsequent workups in 2014, 2015 normal. c. Readmitted 06/2015 with CP/+ troponin - cath showed no significant CAD, LVEF 55-65%   Chronic back pain    Coronary vasospasm (HCC)    Dermatitis  Diverticulosis    Essential hypertension    Fibromyalgia    GERD (gastroesophageal reflux disease)    H/O hiatal hernia    Hypercholesterolemia    Myocardial infarction ([place])    Obesity    Osteoarthritis    PUD (peptic ulcer disease)    a. EGD 06/2015   Pulmonary nodules    a. Incidentally noted on CT by GI 06/2015    Past Surgical History:  Procedure Laterality Date   CARDIAC CATHETERIZATION  12/2011   CARDIAC  CATHETERIZATION N/A 06/09/2015   Procedure: Left Heart Cath and Coronary Angiography;  Surgeon: Jettie Booze, MD;  Location: [place] [facility];  Service: Cardiovascular;  Laterality: N/A;   COLONOSCOPY  08/2013   [facility] /Dr.Benson   COLONOSCOPY N/A 10/30/2018   Procedure: COLONOSCOPY;  Surgeon: Rogene Houston, MD;  [facility];  Service: Endoscopy;  Laterality: N/A;  1200   CYSTECTOMY  ?[id number]   "from my back"   ESOPHAGOGASTRODUODENOSCOPY N/A 06/10/2015   Procedure: ESOPHAGOGASTRODUODENOSCOPY (EGD);  Surgeon: Rogene Houston, MD;  [facility];  Service: Endoscopy;  Laterality: N/A;  140   INGUINAL HERNIA REPAIR Right ~ [place]  ~ 2004   LEFT HEART CATHETERIZATION WITH CORONARY ANGIOGRAM N/A 12/13/2011   Procedure: LEFT HEART CATHETERIZATION WITH CORONARY ANGIOGRAM;  Surgeon: Thayer Headings, MD;  Location: [facility] CATH LAB;  Service: Cardiovascular;  Laterality: N/A;   TUBAL LIGATION  1979   UPPER GASTROINTESTINAL ENDOSCOPY  09/2012   [facility]/Dr.Benson   VAGINAL HYSTERECTOMY  1990    Current Outpatient Medications  Medication Sig Dispense Refill   acetaminophen (TYLENOL) 500 MG tablet Take 1,000 mg by mouth See admin instructions. Take 1000 mg at bedtime, may take an additional 1000 mg dose daily as needed for pain     amLODipine (NORVASC) 5 MG tablet Take 1 tablet by mouth once daily 90 tablet 3   ascorbic acid (VITAMIN C) 500 MG tablet Take 500 mg by mouth daily.     aspirin EC 81 MG tablet Take 81 mg by mouth at bedtime.     diazepam (VALIUM) 2 MG tablet Take 1 mg by mouth See admin instructions. Take 1 mg at bedtime, may take an additional 1 mg dose daily as needed for anxiety     docusate sodium (COLACE) 50 MG capsule Take 50 mg by mouth daily.     famotidine (PEPCID) 20 MG tablet Take 1 tablet (20 mg total) by mouth at bedtime. 30 tablet 1   isosorbide mononitrate (IMDUR) 30 MG 24 hr tablet Take 1 tablet by mouth once daily 90 tablet 2    losartan (COZAAR) 50 MG tablet Take 50 mg by mouth daily.     mirabegron ER (MYRBETRIQ) 25 MG TB24 tablet Take 1 tablet (25 mg total) by mouth daily. 30 tablet 12   Multiple Vitamin (MULTIVITAMIN) tablet Take 1 tablet by mouth daily.     Multiple Vitamins-Minerals (ZINC PO) Take by mouth.     nitroGLYCERIN (NITROSTAT) 0.4 MG SL tablet Place 1 tablet (0.4 mg total) under the tongue every 5 (five) minutes as needed for chest pain (up to 3 doses). 25 tablet 3   ondansetron (ZOFRAN ODT) 8 MG disintegrating tablet Take 1 tablet (8 mg total) by mouth every 8 (eight) hours as needed for nausea or vomiting. 20 tablet 0   pantoprazole (PROTONIX) 40 MG tablet Take 40 mg by mouth 2 (two) times daily.      pravastatin (  PRAVACHOL) 20 MG tablet TAKE 1 TABLET BY MOUTH ONCE DAILY . APPOINTMENT REQUIRED FOR FUTURE REFILLS 90 tablet 2   Probiotic Product (PROBIOTIC-10) CHEW Chew 2 tablets by mouth daily.     Wheat Dextrin (BENEFIBER PO) Take 1 packet by mouth daily as needed (constipation).      No current facility-administered medications for this visit.   Allergies:  Diclofenac sodium, Nsaids, Meperidine-promethazine, Ramipril, Raspberry, Codeine, Prednisone, and Sulfa antibiotics   Social History: The patient  reports that she quit smoking about 9 years ago. Her smoking use included cigarettes. She has a 11.50 pack-year smoking history. She has never used smokeless tobacco. She reports that she does not drink alcohol and does not use drugs.   Family History: The patient's family history includes Bladder Cancer in her mother; Colon cancer in her mother; Coronary artery disease (age of onset: 7) in her father; Diabetes in her father and sister; Fibromyalgia in her mother; Heart attack (age of onset: 14) in her father; Irregular heart beat in her mother; Osteoarthritis in her mother; Osteoporosis in her mother.   ROS:  Please see the history of present illness. Otherwise, complete review of systems is positive for  none.  All other systems are reviewed and negative.   Physical Exam: VS:  BP 126/70   Pulse 88   Ht '5\' 2"'$  (1.575 m)   Wt 177 lb 3.2 oz (80.4 kg)   SpO2 96%   BMI 32.41 kg/m , BMI Body mass index is 32.41 kg/m.  Wt Readings from Last 3 Encounters:  05/26/21 177 lb 3.2 oz (80.4 kg)  03/28/21 181 lb (82.1 kg)  03/28/21 181 lb (82.1 kg)    General: Patient appears comfortable at rest. Neck: Supple, no elevated JVP or carotid bruits, no thyromegaly. Lungs: Clear to auscultation, nonlabored breathing at rest. Cardiac: Regular rate and rhythm, no S3 or significant systolic murmur, no pericardial rub. Extremities: No pitting edema, distal pulses 2+. Skin: Warm and dry. Musculoskeletal: No kyphosis. Neuropsychiatric: Alert and oriented x3, affect grossly appropriate.  ECG:  EKG at [facility], ED on 03/28/2021 sinus rhythm rate of 95.  Low voltage in precordial leads, RSR prime in V1 or V2, right VCD or RVH  Recent Labwork: 03/30/2021: ALT 17; AST 18; BUN 15; Creatinine, Ser 0.83; Hemoglobin 12.5; Magnesium 2.4; Platelets 219; Potassium 4.5; Sodium 141     Component Value Date/Time   CHOL 163 06/09/2015 0105   TRIG 62 06/09/2015 0105   HDL 66 06/09/2015 0105   CHOLHDL 2.5 06/09/2015 0105   VLDL 12 06/09/2015 0105   [place] 85 06/09/2015 0105    Other Studies Reviewed Today:   Echocardiogram 03/29/2021  1. Left ventricular ejection fraction, by estimation, is 60 to 65%. The left ventricle has normal function. The left ventricle has no regional wall motion abnormalities. Left ventricular diastolic parameters were normal. 2. Right ventricular systolic function is normal. The right ventricular size is normal. 3. Left atrial size was moderately dilated. 4. The pericardial effusion is posterior to the left ventricle and anterior to the right ventricle. 5. The mitral valve is normal in structure. Trivial mitral valve regurgitation. No evidence of mitral stenosis. 6. The aortic valve  is tricuspid. Aortic valve regurgitation is not visualized. No aortic stenosis is present. 7. The inferior vena cava is normal in size with greater than 50% respiratory variability, suggesting right atrial pressure of 3 mmHg.       Cardiac Catheterization 06/09/2015 Conclusion  The left ventricular systolic function is  normal. No significant CAD.   Continue preventative therapy. Inpatient team to decide whether further testing is needed.     Assessment and Plan:  1. Atypical chest pain   2. Essential hypertension   3. Mixed hyperlipidemia   4. Gastroesophageal reflux disease without esophagitis    1. Atypical chest pain Recent presentation to [facility] with chest pain.  She was ruled out for ACS.  She had a reassuring echocardiogram.  She has had no further recurrences of chest pain.  She states she believes this may have been attributable to eating too many tacos on the days she presented.  Continue amlodipine 5 mg p.o. daily.  Continue Imdur 30 mg p.o. daily.  Continue sublingual nitroglycerin p.o. as needed  2. Essential hypertension Blood pressure well controlled today at 126/70.  Continue amlodipine 5 mg p.o. daily.  Continue losartan 50 mg p.o. daily.  3. Mixed hyperlipidemia Continue pravastatin 20 mg p.o. daily.  Continue aspirin 81 mg p.o. daily  4. Gastroesophageal reflux disease without esophagitis Continue pantoprazole 40 mg p.o. twice daily.  Continue famotidine 20 mg p.o. daily.  5. Hypokalemia Please repeat basic metabolic panel for recheck for hypokalemia during recent hospital stay.  Initial potassium was 2.8.  But resolved later in hospital stay with discharge potassium of 4.5.     Medication Adjustments/Labs and Tests Ordered: Current medicines are reviewed at length with the patient today.  Concerns regarding medicines are outlined above.   Disposition: Follow-up with Dr. Domenic Polite or APP 6 months  Signed, Levell July, NP 05/26/2021 1:37 PM    [facility] at [place], [place], [place] [address] Phone: [phone]; Fax: [phone]

## 2021-05-26 NOTE — Patient Instructions (Addendum)
Medication Instructions:  Continue all current medications.   Labwork: BMET - order given today.  Office will contact with results via phone or letter.    Testing/Procedures: none  Follow-Up: 6 months   Any Other Special Instructions Will Be Listed Below (If Applicable).    If you need a refill on your cardiac medications before your next appointment, please call your pharmacy.

## 2021-05-30 DIAGNOSIS — I10 Essential (primary) hypertension: Secondary | ICD-10-CM | POA: Diagnosis not present

## 2021-05-30 DIAGNOSIS — Z1159 Encounter for screening for other viral diseases: Secondary | ICD-10-CM | POA: Diagnosis not present

## 2021-05-30 DIAGNOSIS — E7849 Other hyperlipidemia: Secondary | ICD-10-CM | POA: Diagnosis not present

## 2021-05-30 DIAGNOSIS — E782 Mixed hyperlipidemia: Secondary | ICD-10-CM | POA: Diagnosis not present

## 2021-05-30 DIAGNOSIS — E1129 Type 2 diabetes mellitus with other diabetic kidney complication: Secondary | ICD-10-CM | POA: Diagnosis not present

## 2021-05-30 DIAGNOSIS — K2100 Gastro-esophageal reflux disease with esophagitis, without bleeding: Secondary | ICD-10-CM | POA: Diagnosis not present

## 2021-05-30 DIAGNOSIS — N183 Chronic kidney disease, stage 3 unspecified: Secondary | ICD-10-CM | POA: Diagnosis not present

## 2021-06-01 DIAGNOSIS — I10 Essential (primary) hypertension: Secondary | ICD-10-CM | POA: Diagnosis not present

## 2021-06-01 DIAGNOSIS — N183 Chronic kidney disease, stage 3 (moderate): Secondary | ICD-10-CM | POA: Diagnosis not present

## 2021-06-01 DIAGNOSIS — R7303 Prediabetes: Secondary | ICD-10-CM | POA: Diagnosis not present

## 2021-06-01 DIAGNOSIS — R739 Hyperglycemia, unspecified: Secondary | ICD-10-CM | POA: Diagnosis not present

## 2021-06-01 DIAGNOSIS — E7849 Other hyperlipidemia: Secondary | ICD-10-CM | POA: Diagnosis not present

## 2021-06-01 DIAGNOSIS — R1013 Epigastric pain: Secondary | ICD-10-CM | POA: Diagnosis not present

## 2021-06-01 DIAGNOSIS — Z0001 Encounter for general adult medical examination with abnormal findings: Secondary | ICD-10-CM | POA: Diagnosis not present

## 2021-06-01 DIAGNOSIS — Z23 Encounter for immunization: Secondary | ICD-10-CM | POA: Diagnosis not present

## 2021-07-13 DIAGNOSIS — Z23 Encounter for immunization: Secondary | ICD-10-CM | POA: Diagnosis not present

## 2021-08-21 DIAGNOSIS — M7550 Bursitis of unspecified shoulder: Secondary | ICD-10-CM | POA: Diagnosis not present

## 2021-08-25 DIAGNOSIS — M25512 Pain in left shoulder: Secondary | ICD-10-CM | POA: Diagnosis not present

## 2021-11-20 DIAGNOSIS — J019 Acute sinusitis, unspecified: Secondary | ICD-10-CM | POA: Diagnosis not present

## 2021-11-27 DIAGNOSIS — I10 Essential (primary) hypertension: Secondary | ICD-10-CM | POA: Diagnosis not present

## 2021-11-27 DIAGNOSIS — E1129 Type 2 diabetes mellitus with other diabetic kidney complication: Secondary | ICD-10-CM | POA: Diagnosis not present

## 2021-11-27 DIAGNOSIS — E1165 Type 2 diabetes mellitus with hyperglycemia: Secondary | ICD-10-CM | POA: Diagnosis not present

## 2021-11-27 DIAGNOSIS — N183 Chronic kidney disease, stage 3 unspecified: Secondary | ICD-10-CM | POA: Diagnosis not present

## 2021-11-27 DIAGNOSIS — E7849 Other hyperlipidemia: Secondary | ICD-10-CM | POA: Diagnosis not present

## 2021-11-27 DIAGNOSIS — E7800 Pure hypercholesterolemia, unspecified: Secondary | ICD-10-CM | POA: Diagnosis not present

## 2021-11-30 DIAGNOSIS — E7849 Other hyperlipidemia: Secondary | ICD-10-CM | POA: Diagnosis not present

## 2021-11-30 DIAGNOSIS — Z23 Encounter for immunization: Secondary | ICD-10-CM | POA: Diagnosis not present

## 2021-11-30 DIAGNOSIS — M797 Fibromyalgia: Secondary | ICD-10-CM | POA: Diagnosis not present

## 2021-11-30 DIAGNOSIS — N189 Chronic kidney disease, unspecified: Secondary | ICD-10-CM | POA: Diagnosis not present

## 2021-11-30 DIAGNOSIS — R7303 Prediabetes: Secondary | ICD-10-CM | POA: Diagnosis not present

## 2021-11-30 DIAGNOSIS — I10 Essential (primary) hypertension: Secondary | ICD-10-CM | POA: Diagnosis not present

## 2021-11-30 DIAGNOSIS — R1013 Epigastric pain: Secondary | ICD-10-CM | POA: Diagnosis not present

## 2021-12-10 ENCOUNTER — Other Ambulatory Visit: Payer: Self-pay | Admitting: Cardiology

## 2021-12-11 ENCOUNTER — Other Ambulatory Visit: Payer: Self-pay | Admitting: Cardiology

## 2021-12-27 ENCOUNTER — Telehealth: Payer: Self-pay | Admitting: Cardiology

## 2021-12-27 MED ORDER — ISOSORBIDE MONONITRATE ER 30 MG PO TB24: 30.0000 mg | ORAL_TABLET | Freq: Every day | ORAL | 0 refills | Status: DC

## 2021-12-27 MED ORDER — PRAVASTATIN SODIUM 20 MG PO TABS: 20.0000 mg | ORAL_TABLET | Freq: Every day | ORAL | 0 refills | Status: DC

## 2021-12-27 NOTE — Telephone Encounter (Signed)
Refills sent to [facility] per request ?

## 2021-12-27 NOTE — Telephone Encounter (Signed)
?*  STAT* If patient is at the pharmacy, call can be transferred to refill team. ? ? ?1. Which medications need to be refilled? (please list name of each medication and dose if known) pravastatin (PRAVACHOL) 20 MG tablet ?isosorbide mononitrate (IMDUR) 30 MG 24 hr tablet ? ?2. Which pharmacy/location (including street and city if local pharmacy) is medication to be sent to? [place], [place] ? ?3. Do they need a 30 day or 90 day supply? 90 day  ?

## 2022-01-02 DIAGNOSIS — S161XXA Strain of muscle, fascia and tendon at neck level, initial encounter: Secondary | ICD-10-CM | POA: Diagnosis not present

## 2022-01-02 DIAGNOSIS — F17210 Nicotine dependence, cigarettes, uncomplicated: Secondary | ICD-10-CM | POA: Diagnosis not present

## 2022-03-22 ENCOUNTER — Other Ambulatory Visit: Payer: Self-pay | Admitting: Cardiology

## 2022-03-23 DIAGNOSIS — I10 Essential (primary) hypertension: Secondary | ICD-10-CM | POA: Diagnosis not present

## 2022-03-23 DIAGNOSIS — E7849 Other hyperlipidemia: Secondary | ICD-10-CM | POA: Diagnosis not present

## 2022-03-23 DIAGNOSIS — N183 Chronic kidney disease, stage 3 unspecified: Secondary | ICD-10-CM | POA: Diagnosis not present

## 2022-03-23 DIAGNOSIS — E1129 Type 2 diabetes mellitus with other diabetic kidney complication: Secondary | ICD-10-CM | POA: Diagnosis not present

## 2022-03-23 DIAGNOSIS — E782 Mixed hyperlipidemia: Secondary | ICD-10-CM | POA: Diagnosis not present

## 2022-03-23 DIAGNOSIS — E1165 Type 2 diabetes mellitus with hyperglycemia: Secondary | ICD-10-CM | POA: Diagnosis not present

## 2022-03-23 DIAGNOSIS — E7800 Pure hypercholesterolemia, unspecified: Secondary | ICD-10-CM | POA: Diagnosis not present

## 2022-03-25 DIAGNOSIS — K219 Gastro-esophageal reflux disease without esophagitis: Secondary | ICD-10-CM | POA: Diagnosis not present

## 2022-03-25 DIAGNOSIS — Z91018 Allergy to other foods: Secondary | ICD-10-CM | POA: Diagnosis not present

## 2022-03-25 DIAGNOSIS — S0990XA Unspecified injury of head, initial encounter: Secondary | ICD-10-CM | POA: Diagnosis not present

## 2022-03-25 DIAGNOSIS — S0003XA Contusion of scalp, initial encounter: Secondary | ICD-10-CM | POA: Diagnosis not present

## 2022-03-25 DIAGNOSIS — Z79899 Other long term (current) drug therapy: Secondary | ICD-10-CM | POA: Diagnosis not present

## 2022-03-25 DIAGNOSIS — Z882 Allergy status to sulfonamides status: Secondary | ICD-10-CM | POA: Diagnosis not present

## 2022-03-25 DIAGNOSIS — R52 Pain, unspecified: Secondary | ICD-10-CM | POA: Diagnosis not present

## 2022-03-25 DIAGNOSIS — E041 Nontoxic single thyroid nodule: Secondary | ICD-10-CM | POA: Diagnosis not present

## 2022-03-25 DIAGNOSIS — S13150A Subluxation of C4/C5 cervical vertebrae, initial encounter: Secondary | ICD-10-CM | POA: Diagnosis not present

## 2022-03-25 DIAGNOSIS — W19XXXA Unspecified fall, initial encounter: Secondary | ICD-10-CM | POA: Diagnosis not present

## 2022-03-25 DIAGNOSIS — M47816 Spondylosis without myelopathy or radiculopathy, lumbar region: Secondary | ICD-10-CM | POA: Diagnosis not present

## 2022-03-25 DIAGNOSIS — I10 Essential (primary) hypertension: Secondary | ICD-10-CM | POA: Diagnosis not present

## 2022-03-25 DIAGNOSIS — W228XXA Striking against or struck by other objects, initial encounter: Secondary | ICD-10-CM | POA: Diagnosis not present

## 2022-03-25 DIAGNOSIS — Z609 Problem related to social environment, unspecified: Secondary | ICD-10-CM | POA: Diagnosis not present

## 2022-03-25 DIAGNOSIS — Z043 Encounter for examination and observation following other accident: Secondary | ICD-10-CM | POA: Diagnosis not present

## 2022-03-25 DIAGNOSIS — Z885 Allergy status to narcotic agent status: Secondary | ICD-10-CM | POA: Diagnosis not present

## 2022-03-25 DIAGNOSIS — Z886 Allergy status to analgesic agent status: Secondary | ICD-10-CM | POA: Diagnosis not present

## 2022-03-25 DIAGNOSIS — T1490XA Injury, unspecified, initial encounter: Secondary | ICD-10-CM | POA: Diagnosis not present

## 2022-03-25 DIAGNOSIS — M4050 Lordosis, unspecified, site unspecified: Secondary | ICD-10-CM | POA: Diagnosis not present

## 2022-03-25 DIAGNOSIS — Z888 Allergy status to other drugs, medicaments and biological substances status: Secondary | ICD-10-CM | POA: Diagnosis not present

## 2022-03-28 DIAGNOSIS — M797 Fibromyalgia: Secondary | ICD-10-CM | POA: Diagnosis not present

## 2022-03-28 DIAGNOSIS — R1013 Epigastric pain: Secondary | ICD-10-CM | POA: Diagnosis not present

## 2022-03-28 DIAGNOSIS — E7849 Other hyperlipidemia: Secondary | ICD-10-CM | POA: Diagnosis not present

## 2022-03-28 DIAGNOSIS — K5900 Constipation, unspecified: Secondary | ICD-10-CM | POA: Diagnosis not present

## 2022-03-28 DIAGNOSIS — I10 Essential (primary) hypertension: Secondary | ICD-10-CM | POA: Diagnosis not present

## 2022-03-28 DIAGNOSIS — R739 Hyperglycemia, unspecified: Secondary | ICD-10-CM | POA: Diagnosis not present

## 2022-03-28 DIAGNOSIS — M542 Cervicalgia: Secondary | ICD-10-CM | POA: Diagnosis not present

## 2022-03-28 DIAGNOSIS — Z23 Encounter for immunization: Secondary | ICD-10-CM | POA: Diagnosis not present

## 2022-03-28 DIAGNOSIS — R7303 Prediabetes: Secondary | ICD-10-CM | POA: Diagnosis not present

## 2022-03-29 ENCOUNTER — Ambulatory Visit (INDEPENDENT_AMBULATORY_CARE_PROVIDER_SITE_OTHER): Payer: Medicare Other | Admitting: Adult Health

## 2022-03-29 ENCOUNTER — Encounter: Payer: Self-pay | Admitting: Adult Health

## 2022-03-29 VITALS — BP 121/75 | HR 84 | Ht 62.0 in | Wt 177.6 lb

## 2022-03-29 DIAGNOSIS — Z90710 Acquired absence of both cervix and uterus: Secondary | ICD-10-CM | POA: Diagnosis not present

## 2022-03-29 DIAGNOSIS — Z01419 Encounter for gynecological examination (general) (routine) without abnormal findings: Secondary | ICD-10-CM | POA: Diagnosis not present

## 2022-03-29 DIAGNOSIS — Z1211 Encounter for screening for malignant neoplasm of colon: Secondary | ICD-10-CM | POA: Diagnosis not present

## 2022-03-29 DIAGNOSIS — N3946 Mixed incontinence: Secondary | ICD-10-CM

## 2022-03-29 LAB — HEMOCCULT GUIAC POC 1CARD (OFFICE): Fecal Occult Blood, POC: NEGATIVE

## 2022-03-29 MED ORDER — MIRABEGRON ER 25 MG PO TB24: 25.0000 mg | ORAL_TABLET | Freq: Every day | ORAL | 12 refills | Status: DC

## 2022-03-29 NOTE — Progress Notes (Signed)
Patient ID: Jenna Hernandez, female   DOB: 10/28/1950, 71 y.o.   MRN: [id number] History of Present Illness: Jenna Hernandez is a 71 year old white female,divorced, sp hysterectomy, in for a well woman gyn exam.  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 She and he took a fall Sunday, and both are OK. She is needing some help with me, told her to talk with Dr Quintin Alto about [facility] it is not Hosp[facility].  She saw Dr Quintin Alto yesterday. A1c 6.1, lipids good.  PCP is Dr Quintin Alto.  Current Medications, Allergies, Past Medical History, Past Surgical History, Family History and Social History were reviewed in [employer] record.     Review of Systems: Patient denies any headaches, hearing loss, fatigue, blurred vision, shortness of breath, chest pain, abdominal pain, problems with bowel movements, urination(myrbetriq helps, with UI, and only gets up once at night, or intercourse.(Not active) No joint pain or mood swings.    Physical Exam: BP 121/75 (BP Location: Right Arm, Patient Position: Sitting, Cuff Size: Normal)   Pulse 84   Ht '5\' 2"'$  (1.575 m)   Wt 177 lb 9.6 oz (80.6 kg)   BMI 32.48 kg/m   General:  Well developed, well nourished, no acute distress Skin:  Warm and dry Neck:  Midline trachea, normal thyroid, good ROM, no lymphadenopathy,no carotid bruits  Lungs; Clear to auscultation bilaterally Breast:  No dominant palpable mass, retraction, or nipple discharge Cardiovascular: Regular rate and rhythm Abdomen:  Soft, non tender, no hepatosplenomegaly Pelvic:  External genitalia is normal in appearance, no lesions.  The vagina is pale.Urethra has no lesions or masses. The cervix and uterus are absent. No adnexal masses or tenderness noted.Bladder is non tender, no masses felt. Rectal: Good sphincter tone, no polyps, or hemorrhoids felt.  Hemoccult negative. Extremities/musculoskeletal:  No swelling or varicosities noted, no clubbing or cyanosis Psych:  No mood changes, alert  and cooperative,seems happy AA is 0 Fall risk is low    03/29/2022    9:54 AM 03/28/2021   10:45 AM  Depression screen PHQ 2/9  Decreased Interest 0 0  Down, Depressed, Hopeless 1 0  PHQ - 2 Score 1 0  Altered sleeping 0 0  Tired, decreased energy 1 2  Change in appetite 1 2  Feeling bad or failure about yourself  1 0  Trouble concentrating 0 0  Moving slowly or fidgety/restless 0 0  Suicidal thoughts 0 0  PHQ-9 Score 4 4       03/29/2022    9:54 AM 03/28/2021   10:45 AM  GAD 7 : Generalized Anxiety Score  Nervous, Anxious, on Edge 1 0  Control/stop worrying 1 0  Worry too much - different things 1 1  Trouble relaxing 0 0  Restless 0 1  Easily annoyed or irritable 1 1  Afraid - awful might happen 0 0  Total GAD 7 Score 4 3      Upstream - 03/29/22 0955       Pregnancy Intention Screening   Does the patient want to become pregnant in the next year? N/A    Does the patient's partner want to become pregnant in the next year? N/A    Would the patient like to discuss contraceptive options today? N/A      Contraception Wrap Up   Current Method Female Sterilization   hyst   End Method Female Sterilization   hyst   Contraception Counseling Provided No  Examination chaperoned by Celene Squibb LPN   Impression and Plan: 1. Encounter for well woman exam with routine gynecological exam Physical in 1 year Labs with PCP Mammogram negative 05/16/21 at [facility]  2. Encounter for screening fecal occult blood testing Hemoccult negative   3. Mixed stress and urge urinary incontinence Better with myrbetriq, will refill Meds ordered this encounter  Medications   mirabegron ER (MYRBETRIQ) 25 MG TB24 tablet    Sig: Take 1 tablet (25 mg total) by mouth daily.    Dispense:  30 tablet    Refill:  12    Order Specific Question:   Supervising Provider    Answer:   Elonda Husky, LUTHER H [[id number]]     4. S/P hysterectomy

## 2022-04-09 ENCOUNTER — Encounter: Payer: Self-pay | Admitting: Cardiology

## 2022-04-09 ENCOUNTER — Ambulatory Visit (INDEPENDENT_AMBULATORY_CARE_PROVIDER_SITE_OTHER): Payer: Medicare Other | Admitting: Cardiology

## 2022-04-09 VITALS — BP 122/86 | HR 87 | Ht 63.0 in | Wt 177.6 lb

## 2022-04-09 DIAGNOSIS — I10 Essential (primary) hypertension: Secondary | ICD-10-CM

## 2022-04-09 DIAGNOSIS — R0789 Other chest pain: Secondary | ICD-10-CM

## 2022-04-09 DIAGNOSIS — I201 Angina pectoris with documented spasm: Secondary | ICD-10-CM

## 2022-04-09 NOTE — Patient Instructions (Signed)

## 2022-04-09 NOTE — Progress Notes (Signed)
Cardiology Office Note  Date: 04/09/2022   ID: Jenna Hernandez, DOB 1951-10-01, MRN [id number]  PCP:  Manon Hilding, MD  Cardiologist:  Rozann Lesches, MD Electrophysiologist:  None   Chief Complaint  Patient presents with   Cardiac follow-up    History of Present Illness: Jenna Hernandez is a 71 y.o. female last seen in August 2022 by Mr. Leonides Sake NP.  She is here for a follow-up visit.  Reports no recurring angina symptoms or nitroglycerin use.  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  He lives in her home and does have some hospice nursing but not complete assistance every day.  I reviewed her medications which are outlined below.  She reports compliance with therapy.  Blood pressure is well controlled today.  I personally reviewed her ECG which shows sinus rhythm with low voltage.  She continues to follow with Dr. Quintin Alto.  We are requesting her most recent lab work.  Past Medical History:  Diagnosis Date   Antral gastritis    a. EGD 06/2015   Anxiety    CAD (coronary artery disease)    a. NSTEMI 12/2011 - cath smooth/normal cors, ? vasospasm with thrombosis given HK in inferior wall - readmitted same month - MD reviewed films and felt she may have had small branch occlusion off RCA accounting for WMA. b. Subsequent workups in 2014, 2015 normal. c. Readmitted 06/2015 with CP/+ troponin - cath showed no significant CAD, LVEF 55-65%   Chronic back pain    Coronary vasospasm (HCC)    Dermatitis    Diverticulosis    Essential hypertension    Fibromyalgia    GERD (gastroesophageal reflux disease)    H/O hiatal hernia    Hypercholesterolemia    Myocardial infarction ([place])    Obesity    Osteoarthritis    PUD (peptic ulcer disease)    a. EGD 06/2015   Pulmonary nodules    a. Incidentally noted on CT by GI 06/2015    Past Surgical History:  Procedure Laterality Date   CARDIAC CATHETERIZATION  12/2011   CARDIAC CATHETERIZATION N/A  06/09/2015   Procedure: Left Heart Cath and Coronary Angiography;  Surgeon: Jettie Booze, MD;  Location: [place] [facility];  Service: Cardiovascular;  Laterality: N/A;   COLONOSCOPY  08/2013   [facility] /Dr.Benson   COLONOSCOPY N/A 10/30/2018   Procedure: COLONOSCOPY;  Surgeon: Rogene Houston, MD;  Location: [facility];  Service: Endoscopy;  Laterality: N/A;  1200   CYSTECTOMY  ?8299'B   "from my back"   ESOPHAGOGASTRODUODENOSCOPY N/A 06/10/2015   Procedure: ESOPHAGOGASTRODUODENOSCOPY (EGD);  Surgeon: Rogene Houston, MD;  Location: [facility];  Service: Endoscopy;  Laterality: N/A;  140   INGUINAL HERNIA REPAIR Right ~ [place]  ~ 2004   LEFT HEART CATHETERIZATION WITH CORONARY ANGIOGRAM N/A 12/13/2011   Procedure: LEFT HEART CATHETERIZATION WITH CORONARY ANGIOGRAM;  Surgeon: Thayer Headings, MD;  Location: [facility] CATH LAB;  Service: Cardiovascular;  Laterality: N/A;   TUBAL LIGATION  1979   UPPER GASTROINTESTINAL ENDOSCOPY  09/2012   [facility]/Dr.Benson   VAGINAL HYSTERECTOMY  1990    Current Outpatient Medications  Medication Sig Dispense Refill   acetaminophen (TYLENOL) 500 MG tablet Take 1,000 mg by mouth See admin instructions. Take 1000 mg at bedtime, may take an additional 1000 mg dose daily as needed for pain     amLODipine (NORVASC) 5 MG  tablet TAKE 1 TABLET BY MOUTH DAILY 90 tablet 0   ascorbic acid (VITAMIN C) 500 MG tablet Take 500 mg by mouth daily.     aspirin EC 81 MG tablet Take 81 mg by mouth at bedtime.     Calcium Carbonate (CALCIUM 600 PO) Take 1 tablet by mouth daily.     Cholecalciferol 25 MCG (1000 UT) tablet Take 1,000 Units by mouth daily.     cyclobenzaprine (FLEXERIL) 10 MG tablet Take 10 mg by mouth at bedtime.     diazepam (VALIUM) 2 MG tablet Take 1 mg by mouth See admin instructions. Take 1 mg at bedtime, may take an additional 1 mg dose daily as needed for anxiety     docusate sodium (COLACE) 50 MG capsule Take 50 mg by mouth daily.      famotidine (PEPCID) 20 MG tablet Take 1 tablet (20 mg total) by mouth at bedtime. 30 tablet 1   isosorbide mononitrate (IMDUR) 30 MG 24 hr tablet Take 1 tablet (30 mg total) by mouth daily. 90 tablet 0   losartan (COZAAR) 50 MG tablet Take 50 mg by mouth daily.     mirabegron ER (MYRBETRIQ) 25 MG TB24 tablet Take 1 tablet (25 mg total) by mouth daily. 30 tablet 12   Multiple Vitamin (MULTIVITAMIN) tablet Take 1 tablet by mouth daily.     nitroGLYCERIN (NITROSTAT) 0.4 MG SL tablet Place 1 tablet (0.4 mg total) under the tongue every 5 (five) minutes as needed for chest pain (up to 3 doses). 25 tablet 3   ondansetron (ZOFRAN ODT) 8 MG disintegrating tablet Take 1 tablet (8 mg total) by mouth every 8 (eight) hours as needed for nausea or vomiting. 20 tablet 0   pantoprazole (PROTONIX) 40 MG tablet Take 40 mg by mouth 2 (two) times daily.      pravastatin (PRAVACHOL) 20 MG tablet Take 1 tablet (20 mg total) by mouth daily. 90 tablet 0   Probiotic Product (PROBIOTIC-10) CHEW Chew 2 tablets by mouth daily.     vitamin B-12 (CYANOCOBALAMIN) 1000 MCG tablet Take 1,000 mcg by mouth daily.     Wheat Dextrin (BENEFIBER PO) Take 1 packet by mouth daily as needed (constipation).      No current facility-administered medications for this visit.   Allergies:  Diclofenac sodium, Nsaids, Meperidine-promethazine, Ramipril, Raspberry, Codeine, Prednisone, and Sulfa antibiotics   ROS:  No syncope.  Physical Exam: VS:  BP 122/86   Pulse 87   Ht '5\' 3"'$  (1.6 m)   Wt 177 lb 9.6 oz (80.6 kg)   BMI 31.46 kg/m , BMI Body mass index is 31.46 kg/m.  Wt Readings from Last 3 Encounters:  04/09/22 177 lb 9.6 oz (80.6 kg)  03/29/22 177 lb 9.6 oz (80.6 kg)  05/26/21 177 lb 3.2 oz (80.4 kg)    General: Patient appears comfortable at rest. HEENT: Conjunctiva and lids normal, oropharynx clear. Neck: Supple, no elevated JVP or carotid bruits, no thyromegaly. Lungs: Clear to auscultation, nonlabored breathing at  rest. Cardiac: Regular rate and rhythm, no S3 or significant systolic murmur, no pericardial rub. Extremities: No pitting edema.  ECG:  An ECG dated 03/28/2021 was personally reviewed today and demonstrated:  Sinus rhythm with R' in lead V1.  Recent Labwork:  June 2022: Hemoglobin 12.5, platelets 219, potassium 4.5, BUN 15, creatinine 0.83, AST 18, ALT 17  Other Studies Reviewed Today:  Echocardiogram 03/29/2021:  1. Left ventricular ejection fraction, by estimation, is 60 to 65%. The  left  ventricle has normal function. The left ventricle has no regional  wall motion abnormalities. Left ventricular diastolic parameters were  normal.   2. Right ventricular systolic function is normal. The right ventricular  size is normal.   3. Left atrial size was moderately dilated.   4. The pericardial effusion is posterior to the left ventricle and  anterior to the right ventricle.   5. The mitral valve is normal in structure. Trivial mitral valve  regurgitation. No evidence of mitral stenosis.   6. The aortic valve is tricuspid. Aortic valve regurgitation is not  visualized. No aortic stenosis is present.   7. The inferior vena cava is normal in size with greater than 50%  respiratory variability, suggesting right atrial pressure of 3 mmHg.   Assessment and Plan:  1.  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  She is clinically stable, ECG reviewed today.  She does have as needed nitroglycerin available.  Continue aspirin, Norvasc, Cozaar, Imdur, and Pravachol.  2.  Mixed hyperlipidemia, on Pravachol.  Requesting interval lab work from Dr. Quintin Alto.  3.  Essential hypertension, on Norvasc and Cozaar.  Blood pressure is well controlled today.  Medication Adjustments/Labs and Tests Ordered: Current medicines are reviewed at length with the patient today.  Concerns regarding medicines are outlined above.   Tests Ordered: Orders Placed This Encounter   Procedures   EKG 12-Lead    Medication Changes: No orders of the defined types were placed in this encounter.   Disposition:  Follow up  1 year, sooner if needed.  Signed, Satira Sark, MD, [facility] 04/09/2022 3:38 PM    [facility] [place], [place],  [id number] Phone: [phone]; Fax: [phone]

## 2022-04-11 ENCOUNTER — Other Ambulatory Visit: Payer: Self-pay | Admitting: Cardiology

## 2022-05-18 DIAGNOSIS — Z1231 Encounter for screening mammogram for malignant neoplasm of breast: Secondary | ICD-10-CM | POA: Diagnosis not present

## 2022-05-18 DIAGNOSIS — H33312 Horseshoe tear of retina without detachment, left eye: Secondary | ICD-10-CM | POA: Diagnosis not present

## 2022-05-18 DIAGNOSIS — H33012 Retinal detachment with single break, left eye: Secondary | ICD-10-CM | POA: Diagnosis not present

## 2022-05-18 DIAGNOSIS — H2513 Age-related nuclear cataract, bilateral: Secondary | ICD-10-CM | POA: Diagnosis not present

## 2022-05-18 DIAGNOSIS — H4312 Vitreous hemorrhage, left eye: Secondary | ICD-10-CM | POA: Diagnosis not present

## 2022-05-18 DIAGNOSIS — H43812 Vitreous degeneration, left eye: Secondary | ICD-10-CM | POA: Diagnosis not present

## 2022-05-28 ENCOUNTER — Other Ambulatory Visit: Payer: Self-pay | Admitting: Cardiology

## 2022-06-04 DIAGNOSIS — M7062 Trochanteric bursitis, left hip: Secondary | ICD-10-CM | POA: Diagnosis not present

## 2022-06-04 DIAGNOSIS — M545 Low back pain, unspecified: Secondary | ICD-10-CM | POA: Diagnosis not present

## 2022-06-19 IMAGING — DX DG CHEST 2V
2 series · 2 of 2 positions shown · non-contrast
Comparison: None.

CLINICAL DATA: Chest pain today.

EXAM:
CHEST - 2 VIEW

[chest lat]
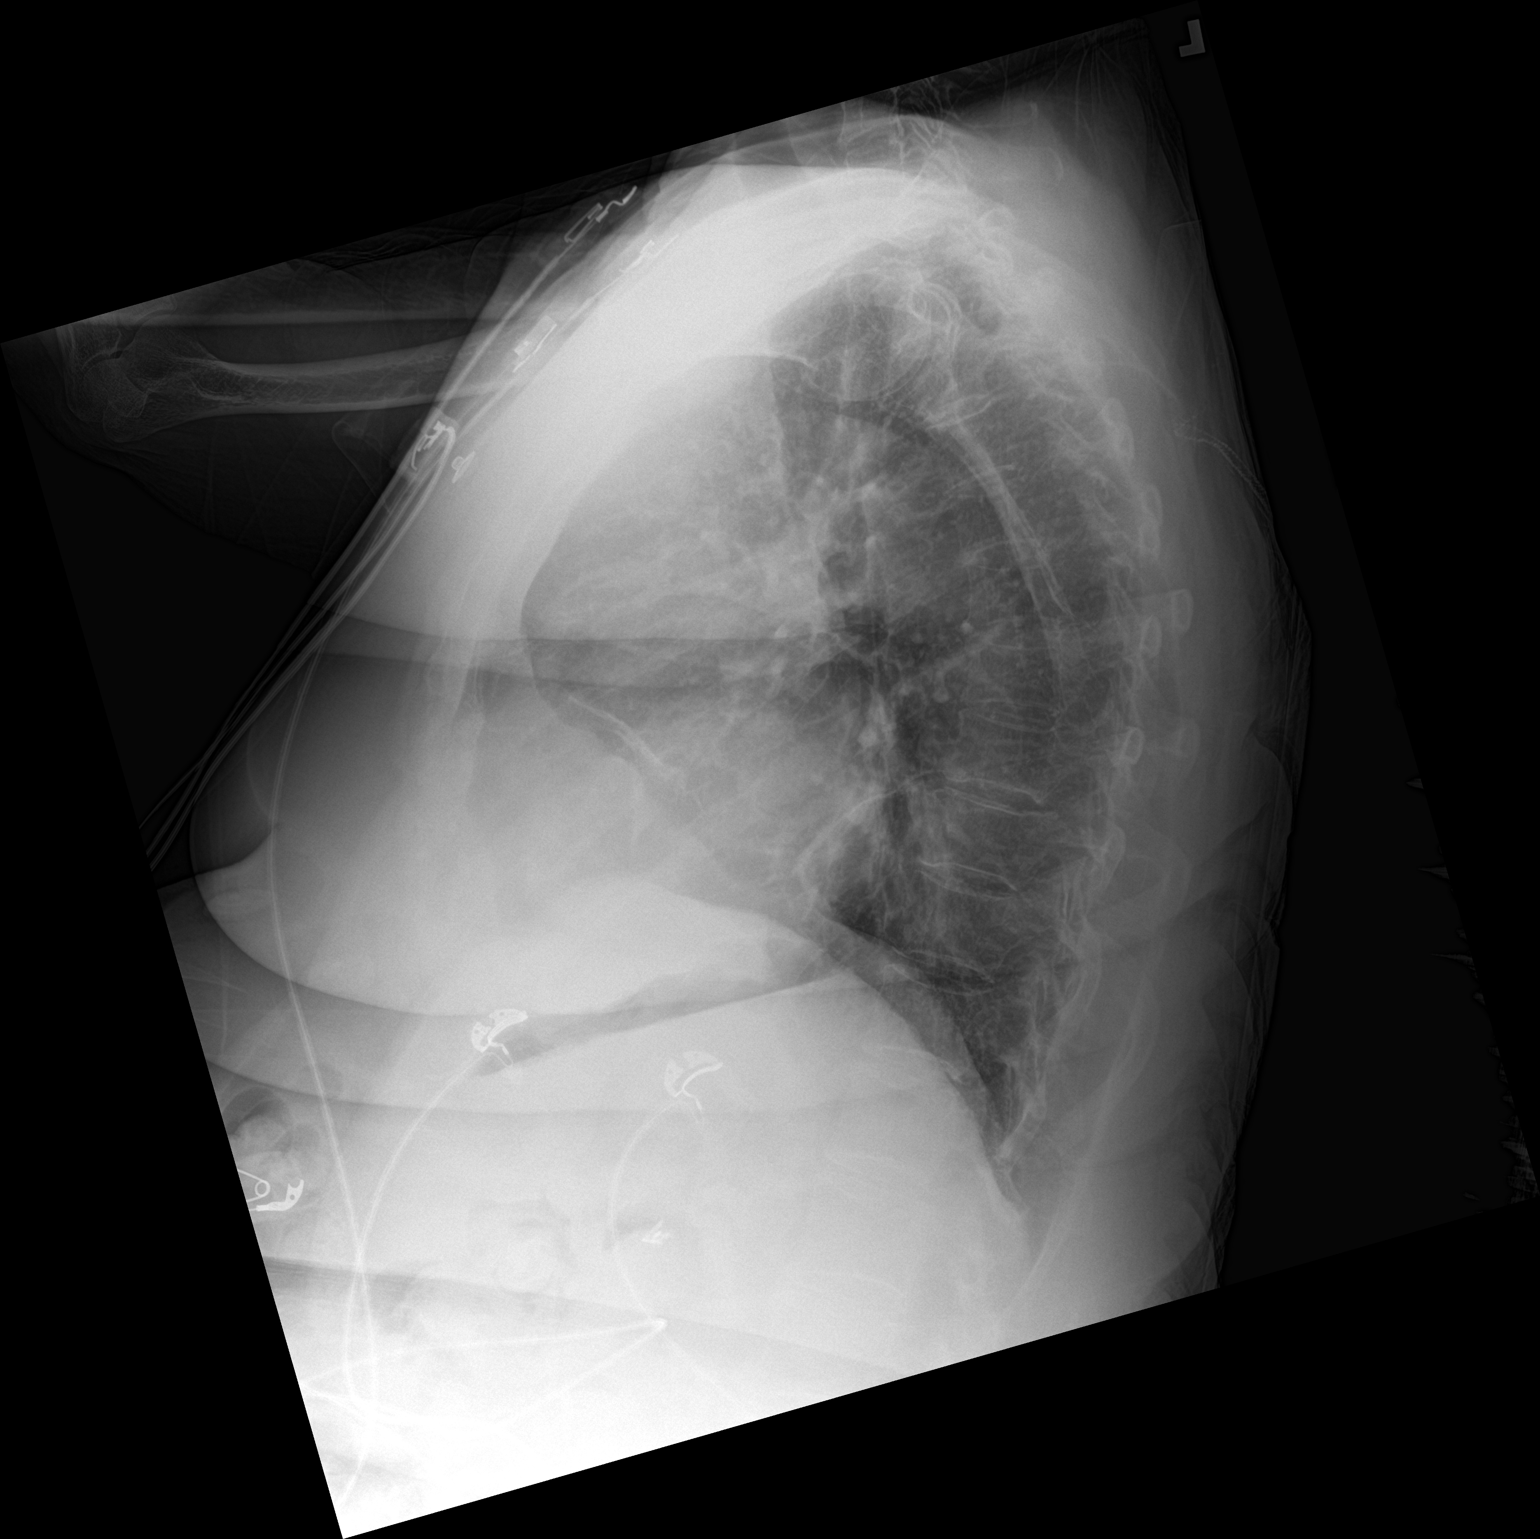

[chest ap]
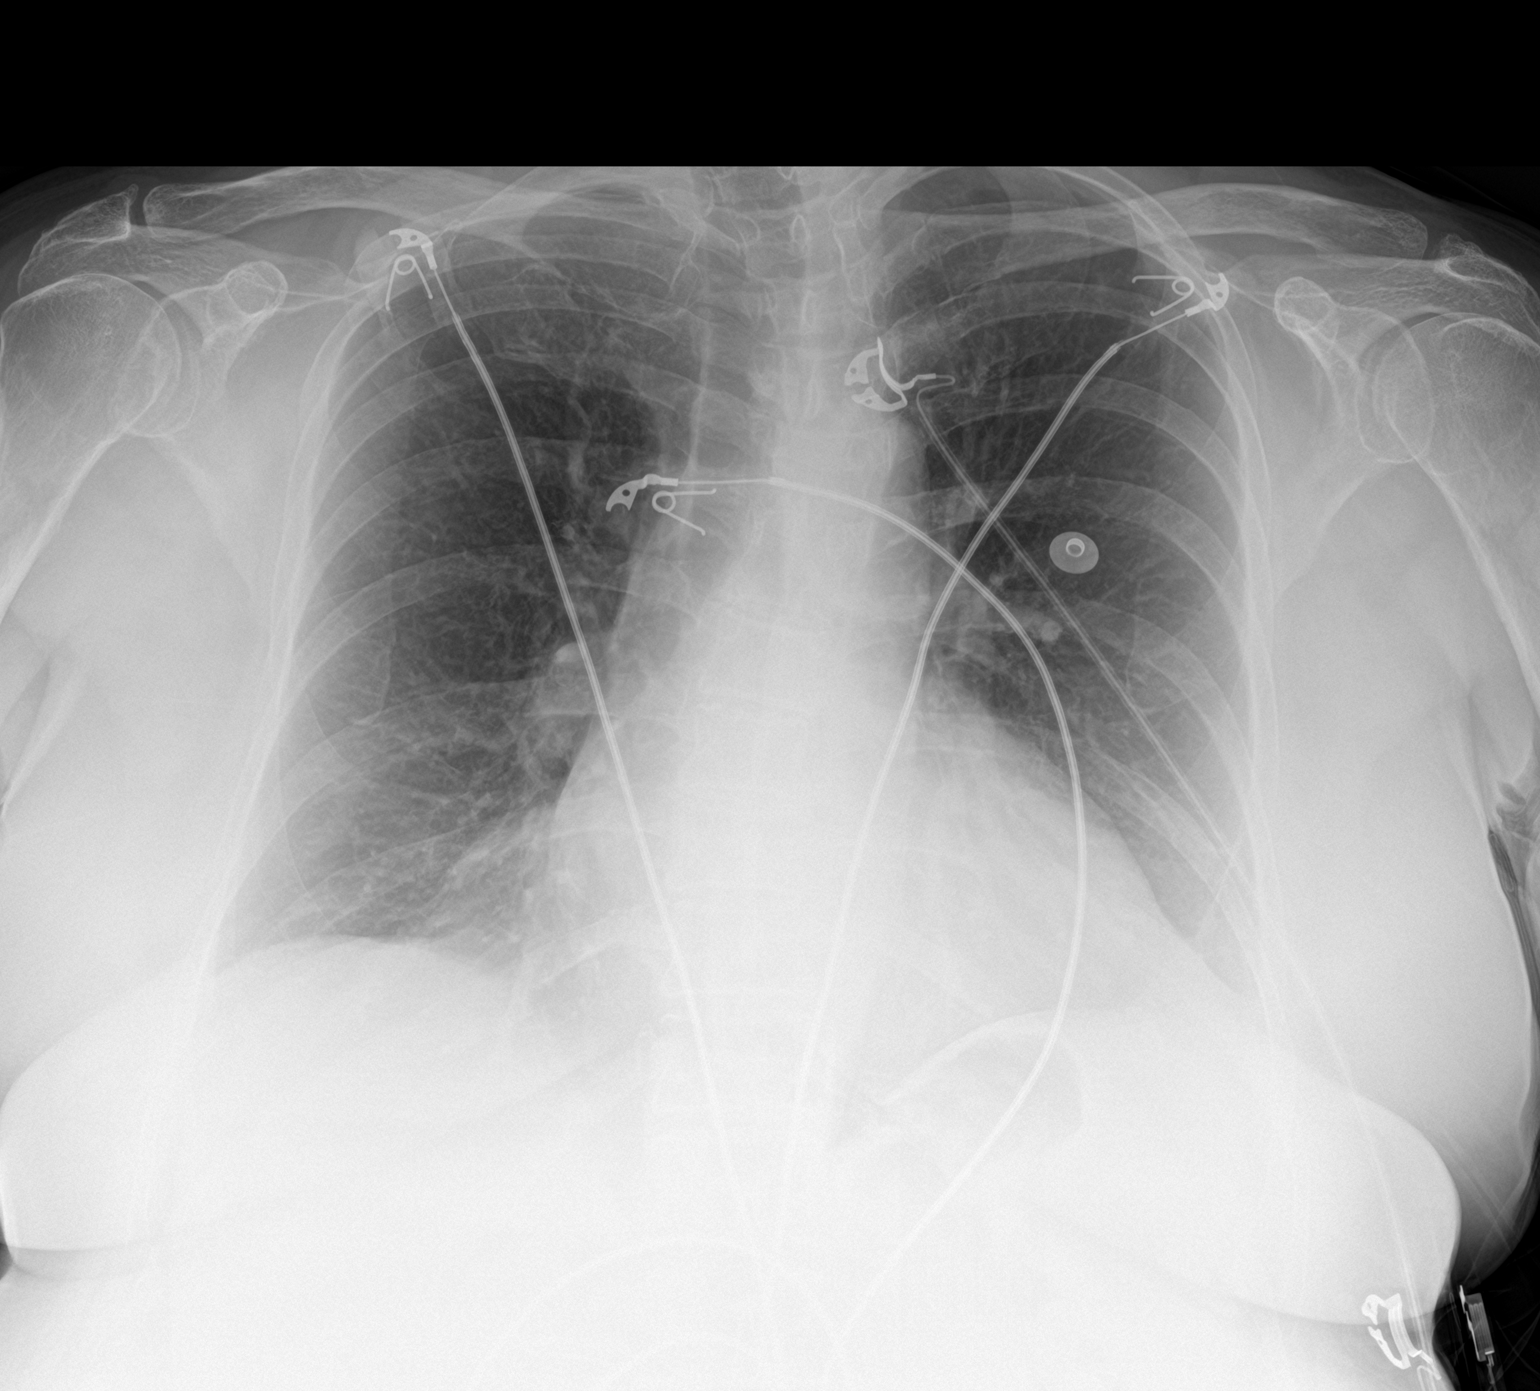

[2 of 2 positions shown; findings below may reference images not displayed]

FINDINGS: The cardiomediastinal contours are normal. Lingular subsegmental
atelectasis or scarring. Pulmonary vasculature is normal. No
consolidation, pleural effusion, or pneumothorax. No acute osseous
abnormalities are seen.
IMPRESSION: Lingular subsegmental atelectasis or scarring.

## 2022-06-29 DIAGNOSIS — H2513 Age-related nuclear cataract, bilateral: Secondary | ICD-10-CM | POA: Diagnosis not present

## 2022-06-29 DIAGNOSIS — H4312 Vitreous hemorrhage, left eye: Secondary | ICD-10-CM | POA: Diagnosis not present

## 2022-06-29 DIAGNOSIS — H33312 Horseshoe tear of retina without detachment, left eye: Secondary | ICD-10-CM | POA: Diagnosis not present

## 2022-06-29 DIAGNOSIS — H43812 Vitreous degeneration, left eye: Secondary | ICD-10-CM | POA: Diagnosis not present

## 2022-07-18 DIAGNOSIS — E1165 Type 2 diabetes mellitus with hyperglycemia: Secondary | ICD-10-CM | POA: Diagnosis not present

## 2022-07-18 DIAGNOSIS — I10 Essential (primary) hypertension: Secondary | ICD-10-CM | POA: Diagnosis not present

## 2022-07-18 DIAGNOSIS — E7849 Other hyperlipidemia: Secondary | ICD-10-CM | POA: Diagnosis not present

## 2022-07-18 DIAGNOSIS — E1129 Type 2 diabetes mellitus with other diabetic kidney complication: Secondary | ICD-10-CM | POA: Diagnosis not present

## 2022-07-18 DIAGNOSIS — R739 Hyperglycemia, unspecified: Secondary | ICD-10-CM | POA: Diagnosis not present

## 2022-07-18 DIAGNOSIS — N183 Chronic kidney disease, stage 3 unspecified: Secondary | ICD-10-CM | POA: Diagnosis not present

## 2022-07-18 DIAGNOSIS — K2100 Gastro-esophageal reflux disease with esophagitis, without bleeding: Secondary | ICD-10-CM | POA: Diagnosis not present

## 2022-07-25 DIAGNOSIS — Z1389 Encounter for screening for other disorder: Secondary | ICD-10-CM | POA: Diagnosis not present

## 2022-07-25 DIAGNOSIS — E7849 Other hyperlipidemia: Secondary | ICD-10-CM | POA: Diagnosis not present

## 2022-07-25 DIAGNOSIS — R7303 Prediabetes: Secondary | ICD-10-CM | POA: Diagnosis not present

## 2022-07-25 DIAGNOSIS — N189 Chronic kidney disease, unspecified: Secondary | ICD-10-CM | POA: Diagnosis not present

## 2022-07-25 DIAGNOSIS — I10 Essential (primary) hypertension: Secondary | ICD-10-CM | POA: Diagnosis not present

## 2022-07-25 DIAGNOSIS — M797 Fibromyalgia: Secondary | ICD-10-CM | POA: Diagnosis not present

## 2022-07-25 DIAGNOSIS — R1013 Epigastric pain: Secondary | ICD-10-CM | POA: Diagnosis not present

## 2022-07-25 DIAGNOSIS — M542 Cervicalgia: Secondary | ICD-10-CM | POA: Diagnosis not present

## 2022-07-25 DIAGNOSIS — Z23 Encounter for immunization: Secondary | ICD-10-CM | POA: Diagnosis not present

## 2022-07-25 DIAGNOSIS — R739 Hyperglycemia, unspecified: Secondary | ICD-10-CM | POA: Diagnosis not present

## 2022-08-25 DIAGNOSIS — J320 Chronic maxillary sinusitis: Secondary | ICD-10-CM | POA: Diagnosis not present

## 2022-09-28 DIAGNOSIS — M797 Fibromyalgia: Secondary | ICD-10-CM | POA: Diagnosis not present

## 2022-11-07 DIAGNOSIS — H2513 Age-related nuclear cataract, bilateral: Secondary | ICD-10-CM | POA: Diagnosis not present

## 2022-11-07 DIAGNOSIS — H43812 Vitreous degeneration, left eye: Secondary | ICD-10-CM | POA: Diagnosis not present

## 2022-11-07 DIAGNOSIS — H33312 Horseshoe tear of retina without detachment, left eye: Secondary | ICD-10-CM | POA: Diagnosis not present

## 2022-11-07 DIAGNOSIS — H4312 Vitreous hemorrhage, left eye: Secondary | ICD-10-CM | POA: Diagnosis not present

## 2022-11-20 DIAGNOSIS — M25559 Pain in unspecified hip: Secondary | ICD-10-CM | POA: Diagnosis not present

## 2022-11-24 ENCOUNTER — Other Ambulatory Visit: Payer: Self-pay | Admitting: Cardiology

## 2022-12-10 ENCOUNTER — Encounter (INDEPENDENT_AMBULATORY_CARE_PROVIDER_SITE_OTHER): Payer: Self-pay | Admitting: *Deleted

## 2022-12-14 ENCOUNTER — Encounter (INDEPENDENT_AMBULATORY_CARE_PROVIDER_SITE_OTHER): Payer: Self-pay | Admitting: Gastroenterology

## 2022-12-14 ENCOUNTER — Ambulatory Visit (INDEPENDENT_AMBULATORY_CARE_PROVIDER_SITE_OTHER): Payer: Medicare Other | Admitting: Gastroenterology

## 2022-12-14 ENCOUNTER — Telehealth (INDEPENDENT_AMBULATORY_CARE_PROVIDER_SITE_OTHER): Payer: Self-pay | Admitting: Gastroenterology

## 2022-12-14 VITALS — BP 137/71 | HR 92 | Temp 97.3°F | Ht 63.0 in | Wt 187.9 lb

## 2022-12-14 DIAGNOSIS — E739 Lactose intolerance, unspecified: Secondary | ICD-10-CM

## 2022-12-14 DIAGNOSIS — R140 Abdominal distension (gaseous): Secondary | ICD-10-CM | POA: Diagnosis not present

## 2022-12-14 DIAGNOSIS — K219 Gastro-esophageal reflux disease without esophagitis: Secondary | ICD-10-CM | POA: Diagnosis not present

## 2022-12-14 DIAGNOSIS — R1013 Epigastric pain: Secondary | ICD-10-CM

## 2022-12-14 DIAGNOSIS — G8929 Other chronic pain: Secondary | ICD-10-CM

## 2022-12-14 DIAGNOSIS — K589 Irritable bowel syndrome without diarrhea: Secondary | ICD-10-CM | POA: Insufficient documentation

## 2022-12-14 DIAGNOSIS — K581 Irritable bowel syndrome with constipation: Secondary | ICD-10-CM | POA: Diagnosis not present

## 2022-12-14 MED ORDER — OMEPRAZOLE 40 MG PO CPDR: 40.0000 mg | DELAYED_RELEASE_CAPSULE | Freq: Two times a day (BID) | ORAL | 1 refills | Status: DC

## 2022-12-14 NOTE — Patient Instructions (Addendum)
Schedule EGD Perform blood workup Stop pantoprazole and famotidine Start omeprazole 40 mg twice a day - patient should take medication in the morning 30-45 minutes before eating breakfast or supper. Discussed avoidance of eating within 2 hours of lying down to sleep and benefit of blocks to elevate head of bed. Also, will benefit from avoiding carbonated drinks/sodas or food that has tomatoes, spicy or greasy food. Explained presumed etiology of IBS symptoms. Patient was counseled about the benefit of implementing a low FODMAP to improve symptoms and recurrent episodes. A dietary list was provided to the patient. Also, the patient was counseled about the benefit of avoiding stressing situations and potential environmental triggers leading to symptomatology. Continue IBGard 1 tablet every 8-12 hours as needed for bloating and abdominal pain Can constinue using Lactaid instead of consuming lactose products If negative EGD, we will need to proceed with pH impedance testing on PPI Start taking Miralax 1 capful every day

## 2022-12-14 NOTE — Telephone Encounter (Signed)
EGD scheduled while pt in office.  Notification or Prior Authorization is not required for the requested services You are not required to submit a notification/prior authorization based on the information provided. If you have general questions about the prior authorization requirements, visit [email or web address] > Clinician Resources > Advance and Admission Notification Requirements. The number above acknowledges your notification. Please write this reference number down for future reference. If you would like to request an organization determination, please call us at [phone]. Decision ID #: [id number]

## 2022-12-14 NOTE — Progress Notes (Unsigned)
Jenna Hernandez, M.D. Gastroenterology & Hepatology [facility] [address] [place], [place] [address] Primary Care Physician: Manon Hilding, MD [place] [address]  Referring MD: PCP  Chief Complaint:  abdominal pain, bloating, heartburn,  History of Present Illness: AURIELLA Hernandez is a 72 y.o. female with PMH IBS-C, fibromyalgia,GLD, CAD s/p PCI with stent placement, who presents for evaluation of abdominal pain, bloating, heartburn.  Patient reports that for multiple years she has presented recurrent episodes of abdominal pain that are described as "tearing pain as if there was a knife cutting" from the mid/epigastric abdomen all the way up to her esophagus/chest. Reports having frequent belching and regurgitation with heartburn when this happens. Has had to sleep in her recliner due to heartburn issues.  She has been taking pantoprazole 40 mg BID for at least 5 years, also recently taking famotidine at night. Believes she took Nexium for a short period of time but it was too costly. Believes these episodes have been intermittent, but reports for the last week these episodes have been happening frequently which has been concerning for her. Takes IBGard when she has upper abdominal pain, which she feels it helps - usually this is followed by a BM.  She has tried to avoid fried or spicy foods. She tries to eat vegetable and fiber. States that after eating she feels very bloated and discomfort after eating. She has had bloating chronically for multiple years. She takes Mylanta and Peptobismol frequently.  She has noticed a lot of "abdominal heaviness" in her upper abdomen after having lactose intake. Feels that when she takes Lactaid , these symptoms gets better.  She has a BM every day - takes a stool softener daily. Her stools are between a [place] 1-3. May take Miralax a needed if she has constipation. Intermittently she may have an  episde of explosive diarrhea. Sometimes feels having a BM helps relieve her abdominal pain.  The patient denies having any nausea, vomiting, fever, chills, hematochezia, melena, hematemesis, jaundice, pruritus or weight loss - she has actually gained weight as she has been less active during the winter time.  Most recent labs from 03/23/22 showed a BUN of 17, creatinine 1.27, potassium 4.2, s/odium 144, AST 20, ALT 20, albumin 368, total bilirubin 0.3 and albumin 4.3.  Last EGD:09/2012 Hiatal hernia  Last Colonoscopy:10/30/18 Colon melanosis, diverticulosis  FHx: neg for any gastrointestinal/liver disease, mother colon cancer  in her late 70s, grandmother colon cancer, nephew and 2 nieces with colon cancer.  Past Medical History: Past Medical History:  Diagnosis Date   Antral gastritis    a. EGD 06/2015   Anxiety    CAD (coronary artery disease)    a. NSTEMI 12/2011 - cath smooth/normal cors, ? vasospasm with thrombosis given HK in inferior wall - readmitted same month - MD reviewed films and felt she may have had small branch occlusion off RCA accounting for WMA. b. Subsequent workups in 2014, 2015 normal. c. Readmitted 06/2015 with CP/+ troponin - cath showed no significant CAD, LVEF 55-65%   Chronic back pain    Coronary vasospasm (HCC)    Dermatitis    Diverticulosis    Essential hypertension    Fibromyalgia    GERD (gastroesophageal reflux disease)    H/O hiatal hernia    Hypercholesterolemia    Myocardial infarction ([place])    Obesity    Osteoarthritis    PUD (peptic ulcer disease)    a. EGD  06/2015   Pulmonary nodules    a. Incidentally noted on CT by GI 06/2015    Past Surgical History: Past Surgical History:  Procedure Laterality Date   CARDIAC CATHETERIZATION  12/2011   CARDIAC CATHETERIZATION N/A 06/09/2015   Procedure: Left Heart Cath and Coronary Angiography;  Surgeon: Jettie Booze, MD;  Location: [facility];  Service: Cardiovascular;  Laterality: N/A;    COLONOSCOPY  08/2013   [facility] /Dr.Benson   COLONOSCOPY N/A 10/30/2018   Procedure: COLONOSCOPY;  Surgeon: Rogene Houston, MD;  [facility];  Service: Endoscopy;  Laterality: N/A;  1200   CYSTECTOMY  ?[id number]   "from my back"   ESOPHAGOGASTRODUODENOSCOPY N/A 06/10/2015   Procedure: ESOPHAGOGASTRODUODENOSCOPY (EGD);  Surgeon: Rogene Houston, MD;  [facility]: [facility];  Service: Endoscopy;  Laterality: N/A;  140   INGUINAL HERNIA REPAIR Right ~ [place]  ~ 2004   LEFT HEART CATHETERIZATION WITH CORONARY ANGIOGRAM N/A 12/13/2011   Procedure: LEFT HEART CATHETERIZATION WITH CORONARY ANGIOGRAM;  Surgeon: Thayer Headings, MD;  Location: [facility] CATH LAB;  Service: Cardiovascular;  Laterality: N/A;   TUBAL LIGATION  1979   UPPER GASTROINTESTINAL ENDOSCOPY  09/2012   [facility]/Dr.Benson   VAGINAL HYSTERECTOMY  1990    Family History: Family History  Problem Relation Age of Onset   Irregular heart beat Mother    Bladder Cancer Mother    Colon cancer Mother    Fibromyalgia Mother    Osteoarthritis Mother    Osteoporosis Mother    Coronary artery disease Father 10   Heart attack Father 48   Diabetes Father    Diabetes Sister    Colon cancer Maternal Grandmother     Social History: Social History   Tobacco Use  Smoking Status Former   Packs/day: 0.50   Years: 23.00   Total pack years: 11.50   Types: Cigarettes   Quit date: 12/12/2011   Years since quitting: 11.0  Smokeless Tobacco Never   Social History   Substance and Sexual Activity  Alcohol Use No   Alcohol/week: 0.0 standard drinks of alcohol   Social History   Substance and Sexual Activity  Drug Use No    Allergies: Allergies  Allergen Reactions   Diclofenac Sodium Anaphylaxis    Tolerates aspirin - was told not to take antiinflammatory meds.   Nsaids Anaphylaxis   Meperidine-Promethazine Other (See Comments)    headache   Ramipril Other (See Comments)    cough   Raspberry Hives    Codeine Rash   Prednisone Palpitations   Sulfa Antibiotics Rash    Medications: Current Outpatient Medications  Medication Sig Dispense Refill   acetaminophen (TYLENOL) 500 MG tablet Take 1,000 mg by mouth See admin instructions. Take 1000 mg at bedtime, may take an additional 1000 mg dose daily as needed for pain     amLODipine (NORVASC) 5 MG tablet TAKE 1 TABLET BY MOUTH DAILY 90 tablet 1   ascorbic acid (VITAMIN C) 500 MG tablet Take 500 mg by mouth daily.     aspirin EC 81 MG tablet Take 81 mg by mouth at bedtime.     Calcium Carbonate (CALCIUM 600 PO) Take 1 tablet by mouth daily.     Cholecalciferol 25 MCG (1000 UT) tablet Take 1,000 Units by mouth daily.     diazepam (VALIUM) 2 MG tablet Take 1 mg by mouth See admin instructions. Take 1 mg at bedtime, may take an additional 1 mg dose  daily as needed for anxiety     docusate sodium (COLACE) 50 MG capsule Take 50 mg by mouth daily.     famotidine (PEPCID) 20 MG tablet Take 1 tablet (20 mg total) by mouth at bedtime. 30 tablet 1   isosorbide mononitrate (IMDUR) 30 MG 24 hr tablet TAKE 1 TABLET BY MOUTH EVERY DAY 90 tablet 2   losartan (COZAAR) 50 MG tablet Take 50 mg by mouth daily.     mirabegron ER (MYRBETRIQ) 25 MG TB24 tablet Take 1 tablet (25 mg total) by mouth daily. 30 tablet 12   Multiple Vitamin (MULTIVITAMIN) tablet Take 1 tablet by mouth daily.     nitroGLYCERIN (NITROSTAT) 0.4 MG SL tablet Place 1 tablet (0.4 mg total) under the tongue every 5 (five) minutes as needed for chest pain (up to 3 doses). 25 tablet 3   ondansetron (ZOFRAN ODT) 8 MG disintegrating tablet Take 1 tablet (8 mg total) by mouth every 8 (eight) hours as needed for nausea or vomiting. 20 tablet 0   OVER THE COUNTER MEDICATION Vit D 3 2000 u daily     pantoprazole (PROTONIX) 40 MG tablet Take 40 mg by mouth 2 (two) times daily.      polyethylene glycol (MIRALAX / GLYCOLAX) 17 g packet Take 17 g by mouth daily as needed.     pravastatin (PRAVACHOL) 20 MG  tablet TAKE 1 TABLET BY MOUTH EVERY DAY 90 tablet 2   Probiotic Product (PROBIOTIC-10) CHEW Chew 2 tablets by mouth daily.     Turmeric (QC TUMERIC COMPLEX PO) Take by mouth daily at 6 (six) AM.     vitamin B-12 (CYANOCOBALAMIN) 1000 MCG tablet Take 1,000 mcg by mouth daily.     Wheat Dextrin (BENEFIBER PO) Take 1 packet by mouth daily as needed (constipation).      No current facility-administered medications for this visit.    Review of Systems: GENERAL: negative for malaise, night sweats HEENT: No changes in hearing or vision, no nose bleeds or other nasal problems. NECK: Negative for lumps, goiter, pain and significant neck swelling RESPIRATORY: Negative for cough, wheezing CARDIOVASCULAR: Negative for chest pain, leg swelling, palpitations, orthopnea GI: SEE HPI MUSCULOSKELETAL: Negative for joint pain or swelling, back pain, and muscle pain. SKIN: Negative for lesions, rash PSYCH: Negative for sleep disturbance, mood disorder and recent psychosocial stressors. HEMATOLOGY Negative for prolonged bleeding, bruising easily, and swollen nodes. ENDOCRINE: Negative for cold or heat intolerance, polyuria, polydipsia and goiter. NEURO: negative for tremor, gait imbalance, syncope and seizures. The remainder of the review of systems is noncontributory.   Physical Exam: BP 137/71 (BP Location: Left Arm, Patient Position: Sitting, Cuff Size: Large)   Pulse 92   Temp (!) 97.3 F (36.3 C) (Temporal)   Ht '5\' 3"'$  (1.6 m)   Wt 187 lb 14.4 oz (85.2 kg)   BMI 33.28 kg/m  GENERAL: The patient is AO x3, in no acute distress. HEENT: Head is normocephalic and atraumatic. EOMI are intact. Mouth is well hydrated and without lesions. NECK: Supple. No masses LUNGS: Clear to auscultation. No presence of rhonchi/wheezing/rales. Adequate chest expansion HEART: RRR, normal s1 and s2. ABDOMEN: Soft, nontender, no guarding, no peritoneal signs, and nondistended. BS +. No masses. EXTREMITIES: Without any  cyanosis, clubbing, rash, lesions or edema. NEUROLOGIC: AOx3, no focal motor deficit. SKIN: no jaundice, no rashes   Imaging/Labs: as above  I personally reviewed and interpreted the available labs, imaging and endoscopic files.  Impression and Plan: Jenna Hernandez is  a 72 y.o. female with PMH IBS-C, fibromyalgia,GLD, CAD s/p PCI with stent placement, who presents for evaluation of abdominal pain, bloating, heartburn.  The patient has presented multiple complaints chronically which have aggravated recently and have been present on a regular basis.  We will proceed with blood workup and endoscopic evaluation of her symptoms.  I explained to her that if there was no endoscopic evidence of ongoing acid reflux, she will need to proceed with pH impedance testing on PPI to rule out this condition.  I also discussed with her that she is taking acid suppressing medications at very high doses.  At this moment I would recommend switching to a different PPI with stronger potency but she will need to stop famotidine intake which she is agreeable to proceed with.    We also discussed thoroughly the possibility that her symptoms are related to IBS, for which she will benefit from implementing a low FODMAP diet.  As she has been with some improvement of her abdominal complaints with the use of IBgard, she can continue taking this as needed.  Part of her symptoms may be related to lactose intolerance, for which she can take lactase supplements as needed.  Finally, her constipation may improve with the use of MiraLAX on a regular basis.  - Schedule EGD - Check CBC, CRP, celiac panel and alpha gal panel - Stop pantoprazole and famotidine - Start omeprazole 40 mg twice a day - patient should take medication in the morning 30-45 minutes before eating breakfast or supper. Discussed avoidance of eating within 2 hours of lying down to sleep and benefit of blocks to elevate head of bed. Also, will benefit from avoiding  carbonated drinks/sodas or food that has tomatoes, spicy or greasy food. - Explained presumed etiology of IBS symptoms. Patient was counseled about the benefit of implementing a low FODMAP to improve symptoms and recurrent episodes. A dietary list was provided to the patient. Also, the patient was counseled about the benefit of avoiding stressing situations and potential environmental triggers leading to symptomatology. - Continue IBGard 1 tablet every 8-12 hours as needed for bloating and abdominal pain - Can constinue using Lactaid instead of consuming lactose products - If negative EGD, we will need to proceed with pH impedance testing on PPI - Start taking Miralax 1 capful every day  All questions were answered.      Jenna Peppers, MD Gastroenterology and Hepatology [facility]

## 2022-12-15 DIAGNOSIS — E739 Lactose intolerance, unspecified: Secondary | ICD-10-CM | POA: Insufficient documentation

## 2022-12-18 LAB — CBC WITH DIFFERENTIAL/PLATELET
Absolute Monocytes: 528 cells/uL (ref 200–950)
Basophils Absolute: 18 cells/uL (ref 0–200)
Basophils Relative: 0.3 %
Eosinophils Absolute: 102 cells/uL (ref 15–500)
Eosinophils Relative: 1.7 %
HCT: 42.5 % (ref 35.0–45.0)
Hemoglobin: 14.2 g/dL (ref 11.7–15.5)
Lymphs Abs: 1092 cells/uL (ref 850–3900)
MCH: 30.4 pg (ref 27.0–33.0)
MCHC: 33.4 g/dL (ref 32.0–36.0)
MCV: 91 fL (ref 80.0–100.0)
MPV: 9.5 fL (ref 7.5–12.5)
Monocytes Relative: 8.8 %
Neutro Abs: 4260 cells/uL (ref 1500–7800)
Neutrophils Relative %: 71 %
Platelets: 237 10*3/uL (ref 140–400)
RBC: 4.67 10*6/uL (ref 3.80–5.10)
RDW: 12.5 % (ref 11.0–15.0)
Total Lymphocyte: 18.2 %
WBC: 6 10*3/uL (ref 3.8–10.8)

## 2022-12-18 LAB — CELIAC DISEASE PANEL
(tTG) Ab, IgA: <1 U/mL
(tTG) Ab, IgG: <1 U/mL
Gliadin IgA: <1 U/mL
Gliadin IgG: <1 U/mL
Immunoglobulin A: 141 mg/dL (ref 70–320)

## 2022-12-18 LAB — ALPHA-GAL PANEL
Allergen, Mutton, f88: <0.1 kU/L
Allergen, Pork, f26: <0.1 kU/L
Beef: <0.1 kU/L
CLASS: 0
CLASS: 0
Class: 0
GALACTOSE-ALPHA-1,3-GALACTOSE IGE*: <0.1 kU/L (ref <0.10)

## 2022-12-18 LAB — INTERPRETATION:

## 2022-12-18 LAB — C-REACTIVE PROTEIN: CRP: 2.3 mg/L (ref <8.0)

## 2023-01-17 ENCOUNTER — Ambulatory Visit (HOSPITAL_COMMUNITY): Payer: Medicare Other | Admitting: Anesthesiology

## 2023-01-17 ENCOUNTER — Encounter (HOSPITAL_COMMUNITY): Payer: Self-pay | Admitting: Gastroenterology

## 2023-01-17 ENCOUNTER — Ambulatory Visit (HOSPITAL_COMMUNITY)
Admission: RE | Admit: 2023-01-17 | Discharge: 2023-01-17 | Disposition: A | Discharge status: Home / Self Care | Payer: Medicare Other | Attending: Gastroenterology | Admitting: Gastroenterology

## 2023-01-17 ENCOUNTER — Ambulatory Visit (HOSPITAL_BASED_OUTPATIENT_CLINIC_OR_DEPARTMENT_OTHER): Payer: Medicare Other | Admitting: Anesthesiology

## 2023-01-17 ENCOUNTER — Other Ambulatory Visit: Payer: Self-pay

## 2023-01-17 ENCOUNTER — Encounter (HOSPITAL_COMMUNITY)
Admission: RE | Disposition: A | Discharge status: Home / Self Care | Payer: Self-pay | Source: Home / Self Care | Attending: Gastroenterology

## 2023-01-17 DIAGNOSIS — R109 Unspecified abdominal pain: Secondary | ICD-10-CM

## 2023-01-17 DIAGNOSIS — I252 Old myocardial infarction: Secondary | ICD-10-CM | POA: Insufficient documentation

## 2023-01-17 DIAGNOSIS — R12 Heartburn: Secondary | ICD-10-CM | POA: Insufficient documentation

## 2023-01-17 DIAGNOSIS — F419 Anxiety disorder, unspecified: Secondary | ICD-10-CM | POA: Diagnosis not present

## 2023-01-17 DIAGNOSIS — E7800 Pure hypercholesterolemia, unspecified: Secondary | ICD-10-CM | POA: Diagnosis not present

## 2023-01-17 DIAGNOSIS — E669 Obesity, unspecified: Secondary | ICD-10-CM | POA: Insufficient documentation

## 2023-01-17 DIAGNOSIS — I10 Essential (primary) hypertension: Secondary | ICD-10-CM | POA: Diagnosis not present

## 2023-01-17 DIAGNOSIS — K3189 Other diseases of stomach and duodenum: Secondary | ICD-10-CM | POA: Insufficient documentation

## 2023-01-17 DIAGNOSIS — I2510 Atherosclerotic heart disease of native coronary artery without angina pectoris: Secondary | ICD-10-CM | POA: Insufficient documentation

## 2023-01-17 DIAGNOSIS — K319 Disease of stomach and duodenum, unspecified: Secondary | ICD-10-CM | POA: Diagnosis not present

## 2023-01-17 DIAGNOSIS — Z6832 Body mass index (BMI) 32.0-32.9, adult: Secondary | ICD-10-CM | POA: Insufficient documentation

## 2023-01-17 DIAGNOSIS — K449 Diaphragmatic hernia without obstruction or gangrene: Secondary | ICD-10-CM | POA: Diagnosis not present

## 2023-01-17 DIAGNOSIS — M797 Fibromyalgia: Secondary | ICD-10-CM | POA: Insufficient documentation

## 2023-01-17 DIAGNOSIS — M1990 Unspecified osteoarthritis, unspecified site: Secondary | ICD-10-CM | POA: Diagnosis not present

## 2023-01-17 DIAGNOSIS — K259 Gastric ulcer, unspecified as acute or chronic, without hemorrhage or perforation: Secondary | ICD-10-CM

## 2023-01-17 DIAGNOSIS — Z87891 Personal history of nicotine dependence: Secondary | ICD-10-CM | POA: Insufficient documentation

## 2023-01-17 DIAGNOSIS — K219 Gastro-esophageal reflux disease without esophagitis: Secondary | ICD-10-CM

## 2023-01-17 DIAGNOSIS — Z8711 Personal history of peptic ulcer disease: Secondary | ICD-10-CM | POA: Insufficient documentation

## 2023-01-17 HISTORY — PX: ESOPHAGOGASTRODUODENOSCOPY (EGD) WITH PROPOFOL: SHX5813

## 2023-01-17 HISTORY — PX: BIOPSY: SHX5522

## 2023-01-17 SURGERY — ESOPHAGOGASTRODUODENOSCOPY (EGD) WITH PROPOFOL
Anesthesia: General

## 2023-01-17 MED ORDER — LIDOCAINE 2% (20 MG/ML) 5 ML SYRINGE
INTRAMUSCULAR | Status: DC | PRN
  Administered 2023-01-17: 60 mg via INTRAVENOUS

## 2023-01-17 MED ORDER — PROPOFOL 10 MG/ML IV BOLUS
INTRAVENOUS | Status: DC | PRN
  Administered 2023-01-17 (×2): 40 mg via INTRAVENOUS
  Administered 2023-01-17: 20 mg via INTRAVENOUS
  Administered 2023-01-17: 30 mg via INTRAVENOUS
  Administered 2023-01-17: 20 mg via INTRAVENOUS

## 2023-01-17 MED ORDER — LACTATED RINGERS IV SOLN: INTRAVENOUS | Status: DC

## 2023-01-17 NOTE — H&P (Signed)
Jenna Hernandez is an 72 y.o. female.   Chief Complaint: heartburn, bloating and abdominal pain HPI: Jenna Hernandez is a 72 y.o. female with PMH IBS-C, fibromyalgia,GLD, CAD s/p PCI with stent placement, who presents for evaluation of abdominal pain, bloating, heartburn.   Patient reports that she is feeling much better since she started taking omeprazole twice daily.  She denies any abdominal pain, heartburn, dysphagia or odynophagia.  Past Medical History:  Diagnosis Date   Antral gastritis    a. EGD 06/2015   Anxiety    CAD (coronary artery disease)    a. NSTEMI 12/2011 - cath smooth/normal cors, ? vasospasm with thrombosis given HK in inferior wall - readmitted same month - MD reviewed films and felt she may have had small branch occlusion off RCA accounting for WMA. b. Subsequent workups in 2014, 2015 normal. c. Readmitted 06/2015 with CP/+ troponin - cath showed no significant CAD, LVEF 55-65%   Chronic back pain    Coronary vasospasm    Dermatitis    Diverticulosis    Essential hypertension    Fibromyalgia    GERD (gastroesophageal reflux disease)    H/O hiatal hernia    Hypercholesterolemia    Myocardial infarction    Obesity    Osteoarthritis    PUD (peptic ulcer disease)    a. EGD 06/2015   Pulmonary nodules    a. Incidentally noted on CT by GI 06/2015    Past Surgical History:  Procedure Laterality Date   CARDIAC CATHETERIZATION  12/2011   CARDIAC CATHETERIZATION N/A 06/09/2015   Procedure: Left Heart Cath and Coronary Angiography;  Surgeon: Corky Crafts, MD;  [facility] INVAS[facility];  Service: Cardiovascular;  Laterality: N/A;   COLONOSCOPY  08/2013   [facility] /Dr.Benson   COLONOSCOPY N/A 10/30/2018   Procedure: COLONOSCOPY;  Surgeon: Malissa Hippo, MD;  [facility];  Service: Endoscopy;  Laterality: N/A;  1200   CYSTECTOMY  ?[id number]   "from my back"   ESOPHAGOGASTRODUODENOSCOPY N/A 06/10/2015   Procedure: ESOPHAGOGASTRODUODENOSCOPY (EGD);  Surgeon:  Malissa Hippo, MD;  [facility];  Service: Endoscopy;  Laterality: N/A;  140   INGUINAL HERNIA REPAIR Right ~ 1983   LAPAROSCOPIC CHOLECYSTECTOMY  ~ 2004   LEFT HEART CATHETERIZATION WITH CORONARY ANGIOGRAM N/A 12/13/2011   Procedure: LEFT HEART CATHETERIZATION WITH CORONARY ANGIOGRAM;  Surgeon: Vesta Mixer, MD;  Location: [facility] CATH LAB;  Service: Cardiovascular;  Laterality: N/A;   TUBAL LIGATION  1979   UPPER GASTROINTESTINAL ENDOSCOPY  09/2012   [facility]/Dr.Benson   VAGINAL HYSTERECTOMY  1990    Family History  Problem Relation Age of Onset   Irregular heart beat Mother    Bladder Cancer Mother    Colon cancer Mother    Fibromyalgia Mother    Osteoarthritis Mother    Osteoporosis Mother    Coronary artery disease Father 18   Heart attack Father 75   Diabetes Father    Diabetes Sister    Colon cancer Maternal Grandmother    Social History:  reports that she quit smoking about 11 years ago. Her smoking use included cigarettes. She has a 11.50 pack-year smoking history. She has never used smokeless tobacco. She reports that she does not drink alcohol and does not use drugs.  Allergies:  Allergies  Allergen Reactions   Diclofenac Sodium Anaphylaxis    Tolerates aspirin - was told not to take antiinflammatory meds.   Nsaids Anaphylaxis   Meperidine-Promethazine Other (See Comments)  headache   Ramipril Other (See Comments)    cough   Raspberry Hives   Codeine Rash   Prednisone Palpitations   Sulfa Antibiotics Rash    Medications Prior to Admission  Medication Sig Dispense Refill   amLODipine (NORVASC) 5 MG tablet TAKE 1 TABLET BY MOUTH DAILY 90 tablet 1   ascorbic acid (VITAMIN C) 500 MG tablet Take 500 mg by mouth daily.     aspirin EC 81 MG tablet Take 81 mg by mouth at bedtime.     Calcium Carbonate (CALCIUM 600 PO) Take 1 tablet by mouth daily.     Cholecalciferol 25 MCG (1000 UT) tablet Take 1,000 Units by mouth daily.     diazepam (VALIUM) 2 MG tablet  Take 1 mg by mouth See admin instructions. Take 1 mg at bedtime, may take an additional 1 mg dose daily as needed for anxiety     docusate sodium (COLACE) 50 MG capsule Take 50 mg by mouth daily.     isosorbide mononitrate (IMDUR) 30 MG 24 hr tablet TAKE 1 TABLET BY MOUTH EVERY DAY 90 tablet 2   losartan (COZAAR) 50 MG tablet Take 50 mg by mouth daily.     mirabegron ER (MYRBETRIQ) 25 MG TB24 tablet Take 1 tablet (25 mg total) by mouth daily. 30 tablet 12   Multiple Vitamin (MULTIVITAMIN) tablet Take 1 tablet by mouth daily.     omeprazole (PRILOSEC) 40 MG capsule Take 1 capsule (40 mg total) by mouth 2 (two) times daily. 180 capsule 1   pravastatin (PRAVACHOL) 20 MG tablet TAKE 1 TABLET BY MOUTH EVERY DAY 90 tablet 2   Probiotic Product (PROBIOTIC-10) CHEW Chew 2 tablets by mouth daily.     Turmeric (QC TUMERIC COMPLEX PO) Take by mouth daily at 6 (six) AM.     vitamin B-12 (CYANOCOBALAMIN) 1000 MCG tablet Take 1,000 mcg by mouth daily.     Wheat Dextrin (BENEFIBER PO) Take 1 packet by mouth daily as needed (constipation).      acetaminophen (TYLENOL) 500 MG tablet Take 1,000 mg by mouth See admin instructions. Take 1000 mg at bedtime, may take an additional 1000 mg dose daily as needed for pain     nitroGLYCERIN (NITROSTAT) 0.4 MG SL tablet Place 1 tablet (0.4 mg total) under the tongue every 5 (five) minutes as needed for chest pain (up to 3 doses). 25 tablet 3   ondansetron (ZOFRAN ODT) 8 MG disintegrating tablet Take 1 tablet (8 mg total) by mouth every 8 (eight) hours as needed for nausea or vomiting. 20 tablet 0   OVER THE COUNTER MEDICATION Vit D 3 2000 u daily     polyethylene glycol (MIRALAX / GLYCOLAX) 17 g packet Take 17 g by mouth daily as needed.      No results found for this or any previous visit (from the past 48 hour(s)). No results found.  Review of Systems  All other systems reviewed and are negative.   Blood pressure (!) 143/84, pulse 89, temperature 98.4 F (36.9 C),  temperature source Oral, resp. rate 15, height 5\' 3"  (1.6 m), weight 83 kg, SpO2 93 %. Physical Exam  GENERAL: The patient is AO x3, in no acute distress. HEENT: Head is normocephalic and atraumatic. EOMI are intact. Mouth is well hydrated and without lesions. NECK: Supple. No masses LUNGS: Clear to auscultation. No presence of rhonchi/wheezing/rales. Adequate chest expansion HEART: RRR, normal s1 and s2. ABDOMEN: Soft, nontender, no guarding, no peritoneal signs, and nondistended. BS +.  No masses. EXTREMITIES: Without any cyanosis, clubbing, rash, lesions or edema. NEUROLOGIC: AOx3, no focal motor deficit. SKIN: no jaundice, no rashes  Assessment/Plan Jenna Hernandez is a 72 y.o. female with PMH IBS-C, fibromyalgia,GLD, CAD s/p PCI with stent placement, who presents for evaluation of abdominal pain, bloating, heartburn.  Will proceed with EGD.  Dolores Frameaniel Castaneda Mayorga, MD 01/17/2023, 8:42 AM

## 2023-01-17 NOTE — Anesthesia Preprocedure Evaluation (Signed)
Anesthesia Evaluation  Patient identified by MRN, date of birth, ID band Patient awake    Reviewed: Allergy & Precautions, H&P , NPO status , Patient's Chart, lab work & pertinent test results, reviewed documented beta blocker date and time   Airway Mallampati: II  TM Distance: >3 FB Neck ROM: full    Dental no notable dental hx.    Pulmonary neg pulmonary ROS, former smoker   Pulmonary exam normal breath sounds clear to auscultation       Cardiovascular Exercise Tolerance: Good hypertension, + CAD and + Past MI  negative cardio ROS  Rhythm:regular Rate:Normal     Neuro/Psych   Anxiety      Neuromuscular disease negative neurological ROS  negative psych ROS   GI/Hepatic negative GI ROS, Neg liver ROS, hiatal hernia, PUD,GERD  ,,  Endo/Other  negative endocrine ROS    Renal/GU negative Renal ROS  negative genitourinary   Musculoskeletal   Abdominal   Peds  Hematology negative hematology ROS (+)   Anesthesia Other Findings   Reproductive/Obstetrics negative OB ROS                             Anesthesia Physical Anesthesia Plan  ASA: 2  Anesthesia Plan: General   Post-op Pain Management:    Induction:   PONV Risk Score and Plan: Propofol infusion  Airway Management Planned:   Additional Equipment:   Intra-op Plan:   Post-operative Plan:   Informed Consent: I have reviewed the patients History and Physical, chart, labs and discussed the procedure including the risks, benefits and alternatives for the proposed anesthesia with the patient or authorized representative who has indicated his/her understanding and acceptance.     Dental Advisory Given  Plan Discussed with: CRNA  Anesthesia Plan Comments:        Anesthesia Quick Evaluation

## 2023-01-17 NOTE — Op Note (Signed)
[facility] Patient Name: Jenna Hernandez Procedure Date: 01/17/2023 8:39 AM MRN: [id number] Date of Birth: [birth year] Attending MD: Katrinka Blazinganiel Castaneda , , [phone] CSN: [id number] Age: [id number] Admit Type: Outpatient Procedure:                Upper GI endoscopy Indications:              Abdominal pain, Heartburn Providers:                Katrinka Blazinganiel Castaneda, Sheran FavaJessica Boudreaux, Kristine L.                            Jessee AversBoone Tech, Technician Referring MD:              Medicines:                Monitored Anesthesia Care Complications:            No immediate complications. Estimated Blood Loss:     Estimated blood loss: none. Procedure:                Pre-Anesthesia Assessment:                           - Prior to the procedure, a History and Physical                            was performed, and patient medications, allergies                            and sensitivities were reviewed. The patient's                            tolerance of previous anesthesia was reviewed.                           - The risks and benefits of the procedure and the                            sedation options and risks were discussed with the                            patient. All questions were answered and informed                            consent was obtained.                           - ASA Grade Assessment: II - A patient with mild                            systemic disease.                           After obtaining informed consent, the endoscope was                            passed under direct vision. Throughout the  procedure, the patient's blood pressure, pulse, and                            oxygen saturations were monitored continuously. The                            [id number]-H190 ([id number]) scope was introduced through the                            mouth, and advanced to the second part of duodenum.                            The upper GI endoscopy was accomplished without                             difficulty. The patient tolerated the procedure                            well. Scope In: 8:52:53 AM Scope Out: 8:58:04 AM Total Procedure Duration: 0 hours 5 minutes 11 seconds  Findings:      A 3 cm hiatal hernia was found. The hiatal narrowing was 38 cm from the       incisors. The Z-line was 35 cm from the incisors.      The gastroesophageal flap valve was visualized endoscopically and       classified as Hill Grade III (minimal fold, loose to endoscope, hiatal       hernia likely).      A few localized small erosions with no stigmata of recent bleeding were       found in the gastric body. Biopsies were taken with a cold forceps for       Helicobacter pylori testing.      The examined duodenum was normal. Biopsies were taken with a cold       forceps for histology. Impression:               - 3 cm hiatal hernia.                           - Erosive gastropathy with no stigmata of recent                            bleeding. Biopsied.                           - Normal examined duodenum. Biopsied. Moderate Sedation:      Per Anesthesia Care Recommendation:           - Discharge patient to home (ambulatory).                           - Resume previous diet.                           - Await pathology results.                           - Continue present medications.                           -  If recurrent episodes of heartburn or interested                            in not taking antacids, can discuss possiblity of                            combo Transoral Incisionless Fundoplication (TIF).                            Pamphlet provided. Procedure Code(s):        --- Professional ---                           [phone], Esophagogastroduodenoscopy, flexible,                            transoral; with biopsy, single or multiple Diagnosis Code(s):        --- Professional ---                           K44.9, Diaphragmatic hernia without obstruction or                             gangrene                           K31.89, Other diseases of stomach and duodenum                           R10.9, Unspecified abdominal pain                           R12, Heartburn CPT copyright 2022 American Medical Association. All rights reserved. The codes documented in this report are preliminary and upon coder review may  be revised to meet current compliance requirements. Katrinka Blazing, MD Katrinka Blazing,  01/17/2023 9:05:38 AM This report has been signed electronically. Number of Addenda: 0

## 2023-01-17 NOTE — Anesthesia Procedure Notes (Signed)
Procedure Name: MAC Date/Time: 01/17/2023 8:56 AM  Performed by: Adria Dill, CRNAPre-anesthesia Checklist: Patient identified, Emergency Drugs available, Suction available and Patient being monitored Patient Re-evaluated:Patient Re-evaluated prior to induction Oxygen Delivery Method: Nasal cannula Preoxygenation: Pre-oxygenation with 100% oxygen Induction Type: IV induction Airway Equipment and Method: Bite block Placement Confirmation: positive ETCO2 and breath sounds checked- equal and bilateral Dental Injury: Teeth and Oropharynx as per pre-operative assessment

## 2023-01-17 NOTE — Transfer of Care (Signed)
Immediate Anesthesia Transfer of Care Note  Patient: Jenna Hernandez  Procedure(s) Performed: ESOPHAGOGASTRODUODENOSCOPY (EGD) WITH PROPOFOL BIOPSY  Patient Location: PACU  Anesthesia Type:MAC  Level of Consciousness: awake and patient cooperative  Airway & Oxygen Therapy: Patient Spontanous Breathing  Post-op Assessment: Report given to RN and Post -op Vital signs reviewed and stable  Post vital signs: Reviewed and stable  Last Vitals:  Vitals Value Taken Time  BP    Temp    Pulse    Resp    SpO2      Last Pain:  Vitals:   01/17/23 0849  TempSrc:   PainSc: 0-No pain      Patients Stated Pain Goal: 3 (01/17/23 0742)  Complications: No notable events documented.

## 2023-01-17 NOTE — Discharge Instructions (Signed)
If recurrent episodes of heartburn or interested in not taking antacids, can discuss possiblity of combo Transoral Incisionless Fundoplication (TIF). Pamphlet provided.

## 2023-01-18 LAB — SURGICAL PATHOLOGY

## 2023-01-18 NOTE — Anesthesia Postprocedure Evaluation (Signed)
Anesthesia Post Note  Patient: Jenna Hernandez  Procedure(s) Performed: ESOPHAGOGASTRODUODENOSCOPY (EGD) WITH PROPOFOL BIOPSY  Patient location during evaluation: Phase II Anesthesia Type: General Level of consciousness: awake Pain management: pain level controlled Vital Signs Assessment: post-procedure vital signs reviewed and stable Respiratory status: spontaneous breathing and respiratory function stable Cardiovascular status: blood pressure returned to baseline and stable Postop Assessment: no headache and no apparent nausea or vomiting Anesthetic complications: no Comments: Late entry   No notable events documented.   Last Vitals:  Vitals:   01/17/23 0901 01/17/23 0905  BP: (!) 120/56 132/68  Pulse: 90 88  Resp: (!) 21 18  Temp: (!) 36.2 C   SpO2: 94% 95%    Last Pain:  Vitals:   01/17/23 0905  TempSrc:   PainSc: 0-No pain                 Windell Norfolk

## 2023-01-24 ENCOUNTER — Encounter (HOSPITAL_COMMUNITY): Payer: Self-pay | Admitting: Gastroenterology

## 2023-01-24 DIAGNOSIS — E7849 Other hyperlipidemia: Secondary | ICD-10-CM | POA: Diagnosis not present

## 2023-01-24 DIAGNOSIS — E782 Mixed hyperlipidemia: Secondary | ICD-10-CM | POA: Diagnosis not present

## 2023-01-24 DIAGNOSIS — N183 Chronic kidney disease, stage 3 unspecified: Secondary | ICD-10-CM | POA: Diagnosis not present

## 2023-01-24 DIAGNOSIS — K2100 Gastro-esophageal reflux disease with esophagitis, without bleeding: Secondary | ICD-10-CM | POA: Diagnosis not present

## 2023-01-24 DIAGNOSIS — E7800 Pure hypercholesterolemia, unspecified: Secondary | ICD-10-CM | POA: Diagnosis not present

## 2023-01-24 DIAGNOSIS — E1129 Type 2 diabetes mellitus with other diabetic kidney complication: Secondary | ICD-10-CM | POA: Diagnosis not present

## 2023-01-24 DIAGNOSIS — E1165 Type 2 diabetes mellitus with hyperglycemia: Secondary | ICD-10-CM | POA: Diagnosis not present

## 2023-01-24 DIAGNOSIS — M797 Fibromyalgia: Secondary | ICD-10-CM | POA: Diagnosis not present

## 2023-01-24 DIAGNOSIS — I10 Essential (primary) hypertension: Secondary | ICD-10-CM | POA: Diagnosis not present

## 2023-01-30 DIAGNOSIS — E7849 Other hyperlipidemia: Secondary | ICD-10-CM | POA: Diagnosis not present

## 2023-01-30 DIAGNOSIS — N189 Chronic kidney disease, unspecified: Secondary | ICD-10-CM | POA: Diagnosis not present

## 2023-01-30 DIAGNOSIS — R1013 Epigastric pain: Secondary | ICD-10-CM | POA: Diagnosis not present

## 2023-01-30 DIAGNOSIS — F17210 Nicotine dependence, cigarettes, uncomplicated: Secondary | ICD-10-CM | POA: Diagnosis not present

## 2023-01-30 DIAGNOSIS — R7303 Prediabetes: Secondary | ICD-10-CM | POA: Diagnosis not present

## 2023-01-30 DIAGNOSIS — R739 Hyperglycemia, unspecified: Secondary | ICD-10-CM | POA: Diagnosis not present

## 2023-01-30 DIAGNOSIS — M797 Fibromyalgia: Secondary | ICD-10-CM | POA: Diagnosis not present

## 2023-01-30 DIAGNOSIS — Z0001 Encounter for general adult medical examination with abnormal findings: Secondary | ICD-10-CM | POA: Diagnosis not present

## 2023-01-30 DIAGNOSIS — I10 Essential (primary) hypertension: Secondary | ICD-10-CM | POA: Diagnosis not present

## 2023-01-30 DIAGNOSIS — M542 Cervicalgia: Secondary | ICD-10-CM | POA: Diagnosis not present

## 2023-01-30 DIAGNOSIS — K5900 Constipation, unspecified: Secondary | ICD-10-CM | POA: Diagnosis not present

## 2023-02-16 ENCOUNTER — Other Ambulatory Visit: Payer: Self-pay | Admitting: Cardiology

## 2023-03-25 DIAGNOSIS — R300 Dysuria: Secondary | ICD-10-CM | POA: Diagnosis not present

## 2023-03-25 DIAGNOSIS — R10819 Abdominal tenderness, unspecified site: Secondary | ICD-10-CM | POA: Diagnosis not present

## 2023-03-25 DIAGNOSIS — F17210 Nicotine dependence, cigarettes, uncomplicated: Secondary | ICD-10-CM | POA: Diagnosis not present

## 2023-03-25 DIAGNOSIS — R319 Hematuria, unspecified: Secondary | ICD-10-CM | POA: Diagnosis not present

## 2023-03-25 DIAGNOSIS — R030 Elevated blood-pressure reading, without diagnosis of hypertension: Secondary | ICD-10-CM | POA: Diagnosis not present

## 2023-04-01 ENCOUNTER — Encounter: Payer: Self-pay | Admitting: Adult Health

## 2023-04-01 ENCOUNTER — Ambulatory Visit (INDEPENDENT_AMBULATORY_CARE_PROVIDER_SITE_OTHER): Payer: Medicare Other | Admitting: Adult Health

## 2023-04-01 VITALS — BP 106/64 | HR 82 | Ht 62.0 in | Wt 186.0 lb

## 2023-04-01 DIAGNOSIS — Z1211 Encounter for screening for malignant neoplasm of colon: Secondary | ICD-10-CM

## 2023-04-01 DIAGNOSIS — Z90710 Acquired absence of both cervix and uterus: Secondary | ICD-10-CM

## 2023-04-01 DIAGNOSIS — N3946 Mixed incontinence: Secondary | ICD-10-CM

## 2023-04-01 DIAGNOSIS — Z01419 Encounter for gynecological examination (general) (routine) without abnormal findings: Secondary | ICD-10-CM

## 2023-04-01 LAB — HEMOCCULT GUIAC POC 1CARD (OFFICE): Fecal Occult Blood, POC: NEGATIVE

## 2023-04-01 NOTE — Progress Notes (Signed)
Patient ID: Jenna Hernandez, female   DOB: Jan 31, 1951, 72 y.o.   MRN: [id number] History of Present Illness: Jenna Hernandez is a 72 year old white female, divorced, sp hysterectomy in for a well woman gyn exam. Her dad died 06/26/2022, and she had cared for him for about 5 years, she still gets teary. She helps care for grand child and a great grand, too.   PCP is Dr Neita Carp   Current Medications, Allergies, Past Medical History, Past Surgical History, Family History and Social History were reviewed in [employer] electronic medical record.     Review of Systems: Patient denies any headaches, hearing loss, fatigue, blurred vision, shortness of breath, chest pain, abdominal pain, problems with bowel movements,  or intercourse(she is not active). No joint pain or mood swings.  Has mixed UI, myrbetriq helps some  Physical Exam:BP 106/64 (BP Location: Right Arm, Patient Position: Sitting, Cuff Size: Normal)   Pulse 82   Ht 5\' 2"  (1.575 m)   Wt 186 lb (84.4 kg)   BMI 34.02 kg/m   General:  Well developed, well nourished, no acute distress Skin:  Warm and dry Neck:  Midline trachea, normal thyroid, good ROM, no lymphadenopathy,no carotid bruits heard Lungs; Clear to auscultation bilaterally Breast:  No dominant palpable mass, retraction, or nipple discharge Cardiovascular: Regular rate and rhythm Abdomen:  Soft, non tender, no hepatosplenomegaly Pelvic:  External genitalia is normal in appearance, no lesions.  The vagina is pale. Urethra has no lesions or masses. The cervix and uterus are absent.  No adnexal masses or tenderness noted.Bladder is non tender, no masses felt. Rectal: Good sphincter tone, no polyps, or hemorrhoids felt.  Hemoccult negative. Extremities/musculoskeletal:  No swelling or varicosities noted, no clubbing or cyanosis Psych:  No mood changes, alert and cooperative,seems happy AA is 0 Fall risk is low    04/01/2023   10:28 AM 03/29/2022    9:54 AM 03/28/2021   10:45 AM   Depression screen PHQ 2/9  Decreased Interest 1 0 0  Down, Depressed, Hopeless 1 1 0  PHQ - 2 Score 2 1 0  Altered sleeping 0 0 0  Tired, decreased energy 1 1 2   Change in appetite 1 1 2   Feeling bad or failure about yourself  1 1 0  Trouble concentrating 0 0 0  Moving slowly or fidgety/restless 0 0 0  Suicidal thoughts 0 0 0  PHQ-9 Score 5 4 4        04/01/2023   10:28 AM 03/29/2022    9:54 AM 03/28/2021   10:45 AM  GAD 7 : Generalized Anxiety Score  Nervous, Anxious, on Edge 0 1 0  Control/stop worrying 0 1 0  Worry too much - different things 0 1 1  Trouble relaxing 0 0 0  Restless 0 0 1  Easily annoyed or irritable 0 1 1  Afraid - awful might happen 0 0 0  Total GAD 7 Score 0 4 3     Examination chaperoned by Jenna Rogue LPN   Impression and Plan: 1. Encounter for well woman exam with routine gynecological exam Physical in 1 year at her request Labs with PCP Mammogram was negative 2023 Colonoscopy per GI She is eating a lot of salmon and some chicken, trying to decrease red meats and lose some weight  2. S/P hysterectomy   3. 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

## 2023-04-08 ENCOUNTER — Ambulatory Visit (INDEPENDENT_AMBULATORY_CARE_PROVIDER_SITE_OTHER): Payer: Medicare Other | Admitting: Gastroenterology

## 2023-04-17 ENCOUNTER — Other Ambulatory Visit: Payer: Self-pay | Admitting: Adult Health

## 2023-05-15 ENCOUNTER — Other Ambulatory Visit: Payer: Self-pay | Admitting: Cardiology

## 2023-05-15 MED ORDER — AMLODIPINE BESYLATE 5 MG PO TABS: 5.0000 mg | ORAL_TABLET | Freq: Every day | ORAL | 1 refills | Status: DC

## 2023-05-15 NOTE — Addendum Note (Signed)
Addended by: Carmelina Paddock on: 05/15/2023 09:35 AM   Modules accepted: Orders

## 2023-05-21 DIAGNOSIS — Z1231 Encounter for screening mammogram for malignant neoplasm of breast: Secondary | ICD-10-CM | POA: Diagnosis not present

## 2023-07-15 ENCOUNTER — Other Ambulatory Visit (INDEPENDENT_AMBULATORY_CARE_PROVIDER_SITE_OTHER): Payer: Self-pay | Admitting: Gastroenterology

## 2023-07-15 DIAGNOSIS — K219 Gastro-esophageal reflux disease without esophagitis: Secondary | ICD-10-CM

## 2023-07-23 DIAGNOSIS — E1129 Type 2 diabetes mellitus with other diabetic kidney complication: Secondary | ICD-10-CM | POA: Diagnosis not present

## 2023-07-23 DIAGNOSIS — E7849 Other hyperlipidemia: Secondary | ICD-10-CM | POA: Diagnosis not present

## 2023-07-23 DIAGNOSIS — E1165 Type 2 diabetes mellitus with hyperglycemia: Secondary | ICD-10-CM | POA: Diagnosis not present

## 2023-07-23 DIAGNOSIS — I10 Essential (primary) hypertension: Secondary | ICD-10-CM | POA: Diagnosis not present

## 2023-07-30 DIAGNOSIS — R7303 Prediabetes: Secondary | ICD-10-CM | POA: Diagnosis not present

## 2023-07-30 DIAGNOSIS — R1013 Epigastric pain: Secondary | ICD-10-CM | POA: Diagnosis not present

## 2023-07-30 DIAGNOSIS — M797 Fibromyalgia: Secondary | ICD-10-CM | POA: Diagnosis not present

## 2023-07-30 DIAGNOSIS — N1831 Chronic kidney disease, stage 3a: Secondary | ICD-10-CM | POA: Diagnosis not present

## 2023-07-30 DIAGNOSIS — K5900 Constipation, unspecified: Secondary | ICD-10-CM | POA: Diagnosis not present

## 2023-07-30 DIAGNOSIS — E7849 Other hyperlipidemia: Secondary | ICD-10-CM | POA: Diagnosis not present

## 2023-07-30 DIAGNOSIS — R739 Hyperglycemia, unspecified: Secondary | ICD-10-CM | POA: Diagnosis not present

## 2023-07-30 DIAGNOSIS — Z23 Encounter for immunization: Secondary | ICD-10-CM | POA: Diagnosis not present

## 2023-07-30 DIAGNOSIS — M542 Cervicalgia: Secondary | ICD-10-CM | POA: Diagnosis not present

## 2023-07-30 DIAGNOSIS — I10 Essential (primary) hypertension: Secondary | ICD-10-CM | POA: Diagnosis not present

## 2023-08-13 ENCOUNTER — Other Ambulatory Visit: Payer: Self-pay | Admitting: Cardiology

## 2023-09-13 ENCOUNTER — Other Ambulatory Visit: Payer: Self-pay | Admitting: Cardiology

## 2023-09-25 ENCOUNTER — Encounter (INDEPENDENT_AMBULATORY_CARE_PROVIDER_SITE_OTHER): Payer: Self-pay | Admitting: *Deleted

## 2023-09-28 ENCOUNTER — Other Ambulatory Visit: Payer: Self-pay | Admitting: Cardiology

## 2023-10-14 ENCOUNTER — Other Ambulatory Visit: Payer: Self-pay | Admitting: Cardiology

## 2023-10-17 ENCOUNTER — Telehealth (INDEPENDENT_AMBULATORY_CARE_PROVIDER_SITE_OTHER): Payer: Self-pay | Admitting: Gastroenterology

## 2023-10-17 NOTE — Telephone Encounter (Signed)
 Room 1 Thanks

## 2023-10-17 NOTE — Telephone Encounter (Signed)
 Who is your primary care physician: Dr.Paul Sasser  Reasons for the colonoscopy: Recall  Have you had a colonoscopy before?  Yes Jan 2024  Do you have family history of colon cancer? Yes mother  Previous colonoscopy with polyps removed? yes  Do you have a history colorectal cancer?   no  Are you diabetic? If yes, Type 1 or Type 2?    no  Do you have a prosthetic or mechanical heart valve? no  Do you have a pacemaker/defibrillator?   no  Have you had endocarditis/atrial fibrillation? no  Have you had joint replacement within the last 12 months?  no  Do you tend to be constipated or have to use laxatives? yes  Do you have any history of drugs or alchohol?  no  Do you use supplemental oxygen?  no  Have you had a stroke or heart attack within the last 6 months? no  Do you take weight loss medication?  no  For female patients: have you had a hysterectomy?  yes                                     are you post menopausal?       yes                                            do you still have your menstrual cycle? no      Do you take any blood-thinning medications such as: (aspirin , warfarin, Plavix , Aggrenox)  yes  If yes we need the name, milligram, dosage and who is prescribing doctor Bayer Aspirin  81 mg Jenna Hernandez Current Outpatient Medications on File Prior to Visit  Medication Sig Dispense Refill   acetaminophen  (TYLENOL ) 500 MG tablet Take 1,000 mg by mouth See admin instructions. Take 1000 mg at bedtime, may take an additional 1000 mg dose daily as needed for pain     amLODipine  (NORVASC ) 5 MG tablet Take 1 tablet (5 mg total) by mouth daily. 90 tablet 1   aspirin  EC 81 MG tablet Take 81 mg by mouth at bedtime.     Cholecalciferol 25 MCG (1000 UT) tablet Take 1,000 Units by mouth daily.     diazepam  (VALIUM ) 2 MG tablet Take 1 mg by mouth See admin instructions. Take 1 mg at bedtime, may take an additional 1 mg dose daily as needed for anxiety     docusate sodium   (COLACE) 50 MG capsule Take 50 mg by mouth daily.     isosorbide  mononitrate (IMDUR ) 30 MG 24 hr tablet TAKE 1 TABLET BY MOUTH EVERY DAY (needs APPOINTMENT FOR further refills) 7 tablet 0   losartan  (COZAAR ) 50 MG tablet Take 50 mg by mouth daily.     Multiple Vitamin (MULTIVITAMIN) tablet Take 1 tablet by mouth daily.     MYRBETRIQ  25 MG TB24 tablet TAKE 1 TABLET BY MOUTH EVERY DAY 30 tablet 12   nitroGLYCERIN  (NITROSTAT ) 0.4 MG SL tablet Place 1 tablet (0.4 mg total) under the tongue every 5 (five) minutes as needed for chest pain (up to 3 doses). 25 tablet 3   omeprazole  (PRILOSEC) 40 MG capsule TAKE ONE CAPSULE BY MOUTH TWICE DAILY 180 capsule 1   OVER THE COUNTER MEDICATION Vit D 3 2000 u daily  polyethylene glycol (MIRALAX  / GLYCOLAX ) 17 g packet Take 17 g by mouth daily as needed.     pravastatin  (PRAVACHOL ) 20 MG tablet TAKE 1 TABLET BY MOUTH EVERY DAY (needs APPOINTMENT FOR refills) 90 tablet 0   Turmeric (QC TUMERIC COMPLEX PO) Take by mouth daily at 6 (six) AM.     vitamin B-12 (CYANOCOBALAMIN) 1000 MCG tablet Take 1,000 mcg by mouth daily.     ascorbic acid  (VITAMIN C) 500 MG tablet Take 500 mg by mouth daily. (Patient not taking: Reported on 10/17/2023)     Calcium  Carbonate (CALCIUM  600 PO) Take 1 tablet by mouth daily. (Patient not taking: Reported on 10/17/2023)     ondansetron  (ZOFRAN  ODT) 8 MG disintegrating tablet Take 1 tablet (8 mg total) by mouth every 8 (eight) hours as needed for nausea or vomiting. (Patient not taking: Reported on 10/17/2023) 20 tablet 0   Probiotic Product (PROBIOTIC-10) CHEW Chew 2 tablets by mouth daily. (Patient not taking: Reported on 04/01/2023)     Wheat Dextrin (BENEFIBER PO) Take 1 packet by mouth daily as needed (constipation).  (Patient not taking: Reported on 10/17/2023)     No current facility-administered medications on file prior to visit.    Allergies  Allergen Reactions   Diclofenac Sodium Anaphylaxis    Tolerates aspirin  - was told not to  take antiinflammatory meds.   Nsaids Anaphylaxis   Meperidine -Promethazine Other (See Comments)    headache   Ramipril  Other (See Comments)    cough   Raspberry Hives   Codeine Rash   Prednisone Palpitations   Sulfa Antibiotics Rash     Pharmacy: [facility]  Primary Insurance Name: [employer] number where you can be reached: [phone]

## 2023-10-18 ENCOUNTER — Telehealth: Payer: Self-pay | Admitting: Cardiology

## 2023-10-18 MED ORDER — ISOSORBIDE MONONITRATE ER 30 MG PO TB24: 30.0000 mg | ORAL_TABLET | Freq: Every day | ORAL | 1 refills | Status: DC

## 2023-10-18 MED ORDER — AMLODIPINE BESYLATE 5 MG PO TABS: 5.0000 mg | ORAL_TABLET | Freq: Every day | ORAL | 1 refills | Status: DC

## 2023-10-18 MED ORDER — PRAVASTATIN SODIUM 20 MG PO TABS: 20.0000 mg | ORAL_TABLET | Freq: Every day | ORAL | 1 refills | Status: DC

## 2023-10-18 NOTE — Telephone Encounter (Signed)
 Patient states that she needs refills on her Amlodipine, Imdur, & Pravastatin.  Refills sent to [facility] as requested.  She is scheduled for 01/08/2024 with Dr. Diona Browner & also placed on wait list.

## 2023-10-18 NOTE — Telephone Encounter (Signed)
 Pt is calling because she called in on 10/11/23 and made the first available appt with Dr Diona Browner which was 01/08/24. But her refills are only have like 7 pills. Please advise

## 2023-10-28 MED ORDER — PEG 3350-KCL-NA BICARB-NACL 420 G PO SOLR: 4000.0000 mL | Freq: Once | ORAL | 0 refills | Status: AC

## 2023-10-28 NOTE — Telephone Encounter (Signed)
Pt contacted and scheduled. Instructions sent via mail. No pa needed per insurance. Prep sent to pharmacy.

## 2023-10-28 NOTE — Addendum Note (Signed)
Addended by: Marlowe Shores on: 10/28/2023 09:47 AM   Modules accepted: Orders

## 2023-10-29 ENCOUNTER — Telehealth (INDEPENDENT_AMBULATORY_CARE_PROVIDER_SITE_OTHER): Payer: Self-pay | Admitting: Gastroenterology

## 2023-10-29 NOTE — Telephone Encounter (Signed)
Dr.Castaneda schedule has changed on 2/28. Jenna Hernandez has been rescheduled to 3/7/ at 9:45am. New instructions will be mailed to Jenna Hernandez.

## 2023-10-29 NOTE — Telephone Encounter (Signed)
Questionnaire from recall, no referral needed  

## 2023-11-13 ENCOUNTER — Other Ambulatory Visit: Payer: Self-pay | Admitting: Adult Health

## 2023-11-13 MED ORDER — ACYCLOVIR 400 MG PO TABS: 400.0000 mg | ORAL_TABLET | Freq: Two times a day (BID) | ORAL | 1 refills | Status: DC

## 2023-11-13 NOTE — Progress Notes (Signed)
 Rx sent for acyclovir

## 2023-12-09 ENCOUNTER — Encounter (HOSPITAL_COMMUNITY)
Admission: RE | Admit: 2023-12-09 | Discharge: 2023-12-09 | Disposition: A | Discharge status: Home / Self Care | Payer: Medicare Other | Source: Ambulatory Visit | Attending: Gastroenterology | Admitting: Gastroenterology

## 2023-12-13 ENCOUNTER — Encounter (HOSPITAL_COMMUNITY)
Admission: RE | Disposition: A | Discharge status: Home / Self Care | Payer: Self-pay | Source: Home / Self Care | Attending: Gastroenterology

## 2023-12-13 ENCOUNTER — Other Ambulatory Visit: Payer: Self-pay

## 2023-12-13 ENCOUNTER — Encounter (HOSPITAL_COMMUNITY): Payer: Self-pay | Admitting: Gastroenterology

## 2023-12-13 ENCOUNTER — Ambulatory Visit (HOSPITAL_COMMUNITY): Admitting: Anesthesiology

## 2023-12-13 ENCOUNTER — Ambulatory Visit (HOSPITAL_BASED_OUTPATIENT_CLINIC_OR_DEPARTMENT_OTHER): Admitting: Anesthesiology

## 2023-12-13 ENCOUNTER — Ambulatory Visit (HOSPITAL_COMMUNITY)
Admission: RE | Admit: 2023-12-13 | Discharge: 2023-12-13 | Disposition: A | Discharge status: Home / Self Care | Payer: Medicare Other | Attending: Gastroenterology | Admitting: Gastroenterology

## 2023-12-13 DIAGNOSIS — Z8711 Personal history of peptic ulcer disease: Secondary | ICD-10-CM | POA: Diagnosis not present

## 2023-12-13 DIAGNOSIS — Z87891 Personal history of nicotine dependence: Secondary | ICD-10-CM | POA: Insufficient documentation

## 2023-12-13 DIAGNOSIS — K562 Volvulus: Secondary | ICD-10-CM | POA: Diagnosis not present

## 2023-12-13 DIAGNOSIS — I2510 Atherosclerotic heart disease of native coronary artery without angina pectoris: Secondary | ICD-10-CM

## 2023-12-13 DIAGNOSIS — F419 Anxiety disorder, unspecified: Secondary | ICD-10-CM | POA: Insufficient documentation

## 2023-12-13 DIAGNOSIS — K635 Polyp of colon: Secondary | ICD-10-CM

## 2023-12-13 DIAGNOSIS — K648 Other hemorrhoids: Secondary | ICD-10-CM

## 2023-12-13 DIAGNOSIS — Z860100 Personal history of colon polyps, unspecified: Secondary | ICD-10-CM | POA: Diagnosis not present

## 2023-12-13 DIAGNOSIS — D123 Benign neoplasm of transverse colon: Secondary | ICD-10-CM | POA: Diagnosis not present

## 2023-12-13 DIAGNOSIS — Z1211 Encounter for screening for malignant neoplasm of colon: Secondary | ICD-10-CM

## 2023-12-13 DIAGNOSIS — I252 Old myocardial infarction: Secondary | ICD-10-CM | POA: Diagnosis not present

## 2023-12-13 DIAGNOSIS — K5730 Diverticulosis of large intestine without perforation or abscess without bleeding: Secondary | ICD-10-CM

## 2023-12-13 DIAGNOSIS — Z800 Family history of malignant neoplasm of digestive organs: Secondary | ICD-10-CM | POA: Diagnosis not present

## 2023-12-13 DIAGNOSIS — D122 Benign neoplasm of ascending colon: Secondary | ICD-10-CM | POA: Diagnosis not present

## 2023-12-13 DIAGNOSIS — I10 Essential (primary) hypertension: Secondary | ICD-10-CM | POA: Insufficient documentation

## 2023-12-13 DIAGNOSIS — K219 Gastro-esophageal reflux disease without esophagitis: Secondary | ICD-10-CM | POA: Diagnosis not present

## 2023-12-13 HISTORY — PX: COLONOSCOPY WITH PROPOFOL: SHX5780

## 2023-12-13 HISTORY — PX: POLYPECTOMY: SHX5525

## 2023-12-13 LAB — HM COLONOSCOPY

## 2023-12-13 SURGERY — COLONOSCOPY WITH PROPOFOL
Anesthesia: General

## 2023-12-13 MED ORDER — EPHEDRINE SULFATE-NACL 50-0.9 MG/10ML-% IV SOSY
PREFILLED_SYRINGE | INTRAVENOUS | Status: DC | PRN
  Administered 2023-12-13 (×5): 5 mg via INTRAVENOUS

## 2023-12-13 MED ORDER — LACTATED RINGERS IV SOLN: INTRAVENOUS | Status: DC

## 2023-12-13 MED ORDER — LIDOCAINE HCL (PF) 2 % IJ SOLN
INTRAMUSCULAR | Status: DC | PRN
  Administered 2023-12-13: 50 mg via INTRADERMAL

## 2023-12-13 MED ORDER — EPHEDRINE 5 MG/ML INJ
INTRAVENOUS | Status: AC
  Filled 2023-12-13: qty 5

## 2023-12-13 MED ORDER — PROPOFOL 10 MG/ML IV BOLUS
INTRAVENOUS | Status: DC | PRN
Start: 2023-12-13 — End: 2023-12-13
  Administered 2023-12-13 (×2): 50 mg via INTRAVENOUS
  Administered 2023-12-13: 100 mg via INTRAVENOUS

## 2023-12-13 MED ORDER — PROPOFOL 500 MG/50ML IV EMUL
INTRAVENOUS | Status: DC | PRN
  Administered 2023-12-13: 150 ug/kg/min via INTRAVENOUS

## 2023-12-13 NOTE — H&P (Signed)
 Jenna Hernandez is an 73 y.o. female.   Chief Complaint: family history colon cancer HPI: 73 year old female with PMH IBS-C, fibromyalgia,GLD, CAD s/p PCI with stent placement, who comes for family history of colon cancer.  Last colonoscopy in 2020, no polyps found.  The patient denies having any complaints such as melena, hematochezia, abdominal pain or distention, change in her bowel movement consistency or frequency, no changes in weight recently.  Mother was diagnosed with colon cancer in her 63s.  Past Medical History:  Diagnosis Date   Antral gastritis    a. EGD 06/2015   Anxiety    CAD (coronary artery disease)    a. NSTEMI 12/2011 - cath smooth/normal cors, ? vasospasm with thrombosis given HK in inferior wall - readmitted same month - MD reviewed films and felt she may have had small branch occlusion off RCA accounting for WMA. b. Subsequent workups in 2014, 2015 normal. c. Readmitted 06/2015 with CP/+ troponin - cath showed no significant CAD, LVEF 55-65%   Chronic back pain    Coronary vasospasm (HCC)    Dermatitis    Diverticulosis    Essential hypertension    Fibromyalgia    GERD (gastroesophageal reflux disease)    H/O hiatal hernia    Hypercholesterolemia    Myocardial infarction (HCC)    Obesity    Osteoarthritis    PUD (peptic ulcer disease)    a. EGD 06/2015   Pulmonary nodules    a. Incidentally noted on CT by GI 06/2015    Past Surgical History:  Procedure Laterality Date   BIOPSY  01/17/2023   Procedure: BIOPSY;  Surgeon: Dolores Frame, MD;  Location: [facility];  Service: Gastroenterology;;   CARDIAC CATHETERIZATION  12/2011   CARDIAC CATHETERIZATION N/A 06/09/2015   Procedure: Left Heart Cath and Coronary Angiography;  Surgeon: Corky Crafts, MD;  Location: [facility] INVASIVE CV LAB;  Service: Cardiovascular;  Laterality: N/A;   COLONOSCOPY  08/2013   [facility] /Dr.Benson   COLONOSCOPY N/A 10/30/2018   Procedure: COLONOSCOPY;  Surgeon: Malissa Hippo,  MD;  Location: [facility];  Service: Endoscopy;  Laterality: N/A;  1200   CYSTECTOMY  ?1610'R   "from my back"   ESOPHAGOGASTRODUODENOSCOPY N/A 06/10/2015   Procedure: ESOPHAGOGASTRODUODENOSCOPY (EGD);  Surgeon: Malissa Hippo, MD;  Location: [facility];  Service: Endoscopy;  Laterality: N/A;  140   ESOPHAGOGASTRODUODENOSCOPY (EGD) WITH PROPOFOL N/A 01/17/2023   Procedure: ESOPHAGOGASTRODUODENOSCOPY (EGD) WITH PROPOFOL;  Surgeon: Dolores Frame, MD;  Location: [facility];  Service: Gastroenterology;  Laterality: N/A;  900am, asa 1-2   INGUINAL HERNIA REPAIR Right ~ 1983   LAPAROSCOPIC CHOLECYSTECTOMY  ~ 2004   LEFT HEART CATHETERIZATION WITH CORONARY ANGIOGRAM N/A 12/13/2011   Procedure: LEFT HEART CATHETERIZATION WITH CORONARY ANGIOGRAM;  Surgeon: Vesta Mixer, MD;  Location: [facility] C[facility] LAB;  Service: Cardiovascular;  Laterality: N/A;   TUBAL LIGATION  1979   UPPER GASTROINTESTINAL ENDOSCOPY  09/2012   [facility]/Dr.Benson   VAGINAL HYSTERECTOMY  1990    Family History  Problem Relation Age of Onset   Colon cancer Maternal Grandmother    Coronary artery disease Father 31   Heart attack Father 8   Diabetes Father    Irregular heart beat Mother    Bladder Cancer Mother    Colon cancer Mother    Fibromyalgia Mother    Osteoarthritis Mother    Osteoporosis Mother    Diabetes Sister    Social History:  reports that she  quit smoking about 12 years ago. Her smoking use included cigarettes. She started smoking about 35 years ago. She has a 11.5 pack-year smoking history. She has never used smokeless tobacco. She reports that she does not drink alcohol and does not use drugs.  Allergies:  Allergies  Allergen Reactions   Diclofenac Sodium Anaphylaxis    Tolerates aspirin - was told not to take antiinflammatory meds.   Nsaids Anaphylaxis   Meperidine-Promethazine Other (See Comments)    headache   Ramipril Other (See Comments)    cough   Raspberry Hives   Codeine Rash    Prednisone Palpitations   Sulfa Antibiotics Rash    Medications Prior to Admission  Medication Sig Dispense Refill   acetaminophen (TYLENOL) 500 MG tablet Take 1,000 mg by mouth See admin instructions. Take 1000 mg at bedtime, may take an additional 1000 mg dose daily as needed for pain     acyclovir (ZOVIRAX) 400 MG tablet Take 1 tablet (400 mg total) by mouth 2 (two) times daily. 60 tablet 1   amLODipine (NORVASC) 5 MG tablet Take 1 tablet (5 mg total) by mouth daily. 90 tablet 1   isosorbide mononitrate (IMDUR) 30 MG 24 hr tablet Take 1 tablet (30 mg total) by mouth daily. 90 tablet 1   ascorbic acid (VITAMIN C) 500 MG tablet Take 500 mg by mouth daily. (Patient not taking: Reported on 10/17/2023)     aspirin EC 81 MG tablet Take 81 mg by mouth at bedtime.     Calcium Carbonate (CALCIUM 600 PO) Take 1 tablet by mouth daily. (Patient not taking: Reported on 10/17/2023)     Cholecalciferol 25 MCG (1000 UT) tablet Take 1,000 Units by mouth daily.     diazepam (VALIUM) 2 MG tablet Take 1 mg by mouth See admin instructions. Take 1 mg at bedtime, may take an additional 1 mg dose daily as needed for anxiety     docusate sodium (COLACE) 50 MG capsule Take 50 mg by mouth daily.     losartan (COZAAR) 50 MG tablet Take 50 mg by mouth daily.     Multiple Vitamin (MULTIVITAMIN) tablet Take 1 tablet by mouth daily.     MYRBETRIQ 25 MG TB24 tablet TAKE 1 TABLET BY MOUTH EVERY DAY 30 tablet 12   nitroGLYCERIN (NITROSTAT) 0.4 MG SL tablet Place 1 tablet (0.4 mg total) under the tongue every 5 (five) minutes as needed for chest pain (up to 3 doses). 25 tablet 3   omeprazole (PRILOSEC) 40 MG capsule TAKE ONE CAPSULE BY MOUTH TWICE DAILY 180 capsule 1   ondansetron (ZOFRAN ODT) 8 MG disintegrating tablet Take 1 tablet (8 mg total) by mouth every 8 (eight) hours as needed for nausea or vomiting. (Patient not taking: Reported on 10/17/2023) 20 tablet 0   OVER THE COUNTER MEDICATION Vit D 3 2000 u daily      polyethylene glycol (MIRALAX / GLYCOLAX) 17 g packet Take 17 g by mouth daily as needed.     pravastatin (PRAVACHOL) 20 MG tablet Take 1 tablet (20 mg total) by mouth daily. 90 tablet 1   Probiotic Product (PROBIOTIC-10) CHEW Chew 2 tablets by mouth daily. (Patient not taking: Reported on 04/01/2023)     Turmeric (QC TUMERIC COMPLEX PO) Take by mouth daily at 6 (six) AM.     vitamin B-12 (CYANOCOBALAMIN) 1000 MCG tablet Take 1,000 mcg by mouth daily.     Wheat Dextrin (BENEFIBER PO) Take 1 packet by mouth daily as needed (constipation).  (  Patient not taking: Reported on 10/17/2023)      No results found for this or any previous visit (from the past 48 hours). No results found.  Review of Systems  All other systems reviewed and are negative.   Blood pressure 137/77, pulse 80, temperature 98.7 F (37.1 C), temperature source Oral, resp. rate 14, height 5\' 3"  (1.6 m), weight 82.1 kg, SpO2 98%. Physical Exam  GENERAL: The patient is AO x3, in no acute distress. HEENT: Head is normocephalic and atraumatic. EOMI are intact. Mouth is well hydrated and without lesions. NECK: Supple. No masses LUNGS: Clear to auscultation. No presence of rhonchi/wheezing/rales. Adequate chest expansion HEART: RRR, normal s1 and s2. ABDOMEN: Soft, nontender, no guarding, no peritoneal signs, and nondistended. BS +. No masses. EXTREMITIES: Without any cyanosis, clubbing, rash, lesions or edema. NEUROLOGIC: AOx3, no focal motor deficit. SKIN: no jaundice, no rashes  Assessment/Plan  73 year old female with PMH IBS-C, fibromyalgia,GLD, CAD s/p PCI with stent placement, who comes for family history of colon cancer.  Will proceed with colonoscopy.  Dolores Frame, MD 12/13/2023, 9:04 AM

## 2023-12-13 NOTE — Discharge Instructions (Signed)
 You are being discharged to home.  Resume your previous diet.  We are waiting for your pathology results.  Your physician has recommended a repeat colonoscopy for surveillance based on pathology results.

## 2023-12-13 NOTE — Transfer of Care (Signed)
 Immediate Anesthesia Transfer of Care Note  Patient: Jenna Hernandez  Procedure(s) Performed: COLONOSCOPY WITH PROPOFOL POLYPECTOMY  Patient Location: Short Stay  Anesthesia Type:General  Level of Consciousness: awake  Airway & Oxygen Therapy: Patient Spontanous Breathing  Post-op Assessment: Report given to RN and Post -op Vital signs reviewed and stable  Post vital signs: Reviewed and stable  Last Vitals:  Vitals Value Taken Time  BP    Temp    Pulse    Resp    SpO2      Last Pain:  Vitals:   12/13/23 0909  TempSrc:   PainSc: 0-No pain      Patients Stated Pain Goal: 5 (12/13/23 0841)  Complications: No notable events documented.

## 2023-12-13 NOTE — Anesthesia Procedure Notes (Signed)
 Date/Time: 12/13/2023 9:09 AM  Performed by: Julian Reil, CRNAPre-anesthesia Checklist: Patient identified, Emergency Drugs available, Patient being monitored and Suction available Patient Re-evaluated:Patient Re-evaluated prior to induction Oxygen Delivery Method: Nasal cannula Induction Type: IV induction Placement Confirmation: positive ETCO2

## 2023-12-13 NOTE — Anesthesia Preprocedure Evaluation (Signed)
 Anesthesia Evaluation  Patient identified by MRN, date of birth, ID band Patient awake    Reviewed: Allergy & Precautions, H&P , NPO status , Patient's Chart, lab work & pertinent test results, reviewed documented beta blocker date and time   Airway Mallampati: II  TM Distance: >3 FB Neck ROM: full    Dental no notable dental hx.    Pulmonary neg pulmonary ROS, former smoker   Pulmonary exam normal breath sounds clear to auscultation       Cardiovascular Exercise Tolerance: Good hypertension, + CAD and + Past MI  negative cardio ROS  Rhythm:regular Rate:Normal     Neuro/Psych   Anxiety      Neuromuscular disease negative neurological ROS  negative psych ROS   GI/Hepatic negative GI ROS, Neg liver ROS, hiatal hernia, PUD,GERD  ,,  Endo/Other  negative endocrine ROS    Renal/GU negative Renal ROS  negative genitourinary   Musculoskeletal   Abdominal   Peds  Hematology negative hematology ROS (+)   Anesthesia Other Findings   Reproductive/Obstetrics negative OB ROS                             Anesthesia Physical Anesthesia Plan  ASA: 3  Anesthesia Plan: General   Post-op Pain Management:    Induction:   PONV Risk Score and Plan: Propofol infusion  Airway Management Planned:   Additional Equipment:   Intra-op Plan:   Post-operative Plan:   Informed Consent: I have reviewed the patients History and Physical, chart, labs and discussed the procedure including the risks, benefits and alternatives for the proposed anesthesia with the patient or authorized representative who has indicated his/her understanding and acceptance.     Dental Advisory Given  Plan Discussed with: CRNA  Anesthesia Plan Comments:        Anesthesia Quick Evaluation

## 2023-12-13 NOTE — Op Note (Signed)
 [facility] Patient Name: Jenna Hernandez Procedure Date: 12/13/2023 8:52 AM MRN: [id number] Date of Birth: 14-Jul-1951 Attending MD: Katrinka Blazing , , [id number] CSN: [id number] Age: 73 Admit Type: Outpatient Procedure:                Colonoscopy Indications:              Screening patient at increased risk: Family history                            of 1st-degree relative with colorectal cancer at                            age 61 years (or older) Providers:                Katrinka Blazing, Francoise Ceo RN, RN, Dyann Ruddle Referring MD:              Medicines:                Monitored Anesthesia Care Complications:            No immediate complications. Estimated Blood Loss:     Estimated blood loss: none. Procedure:                Pre-Anesthesia Assessment:                           - Prior to the procedure, a History and Physical                            was performed, and patient medications, allergies                            and sensitivities were reviewed. The patient's                            tolerance of previous anesthesia was reviewed.                           - The risks and benefits of the procedure and the                            sedation options and risks were discussed with the                            patient. All questions were answered and informed                            consent was obtained.                           - ASA Grade Assessment: II - A patient with mild                            systemic disease.                           After obtaining  informed consent, the colonoscope                            was passed under direct vision. Throughout the                            procedure, the patient's blood pressure, pulse, and                            oxygen saturations were monitored continuously. The                            [id number]-HQ190L ([id number]) scope was introduced through                            the anus and advanced to the  the cecum, identified                            by appendiceal orifice and ileocecal valve. The                            colonoscopy was unusually difficult due to                            significant looping. The patient tolerated the                            procedure well. The quality of the bowel                            preparation was good. Scope In: 9:15:13 AM Scope Out: 10:17:05 AM Scope Withdrawal Time: 0 hours 20 minutes 17 seconds  Total Procedure Duration: 1 hour 1 minute 52 seconds  Findings:      The perianal and digital rectal examinations were normal.      The ascending colon revealed significantly excessive looping. Scope       could be advanced by lifting the LLQ from the back of the patient.      Four sessile polyps were found in the ascending colon. The polyps were 1       to 2 mm in size. These polyps were removed with a cold biopsy forceps.       Resection and retrieval were complete.      Five sessile polyps were found in the transverse colon and ascending       colon. The polyps were 3 to 6 mm in size. These polyps were removed with       a cold snare. Resection and retrieval were complete.      A 2 mm polyp was found in the descending colon. The polyp was sessile.       The polyp was removed with a cold snare. Resection was complete, but the       polyp tissue was not retrieved.      Scattered medium-mouthed and small-mouthed diverticula were found in the       sigmoid colon and descending colon.      Non-bleeding internal hemorrhoids were found during retroflexion. The  hemorrhoids were small. Impression:               - There was significant looping of the colon.                           - Four 1 to 2 mm polyps in the ascending colon,                            removed with a cold biopsy forceps. Resected and                            retrieved.                           - Five 3 to 6 mm polyps in the transverse colon and                             in the ascending colon, removed with a cold snare.                            Resected and retrieved.                           - One 2 mm polyp in the descending colon, removed                            with a cold snare. Complete resection. Polyp tissue                            not retrieved.                           - Diverticulosis in the sigmoid colon and in the                            descending colon.                           - Non-bleeding internal hemorrhoids. Moderate Sedation:      Per Anesthesia Care Recommendation:           - Discharge patient to home (ambulatory).                           - Resume previous diet.                           - Await pathology results.                           - Repeat colonoscopy for surveillance based on                            pathology results. Procedure Code(s):        --- Professional ---  46962, Colonoscopy, flexible; with removal of                            tumor(s), polyp(s), or other lesion(s) by snare                            technique                           45380, 59, Colonoscopy, flexible; with biopsy,                            single or multiple Diagnosis Code(s):        --- Professional ---                           Z80.0, Family history of malignant neoplasm of                            digestive organs                           K64.8, Other hemorrhoids                           D12.3, Benign neoplasm of transverse colon (hepatic                            flexure or splenic flexure)                           D12.2, Benign neoplasm of ascending colon                           D12.4, Benign neoplasm of descending colon                           K57.30, Diverticulosis of large intestine without                            perforation or abscess without bleeding CPT copyright 2022 American Medical Association. All rights reserved. The codes documented in this report are  preliminary and upon coder review may  be revised to meet current compliance requirements. Katrinka Blazing, MD Katrinka Blazing,  12/13/2023 10:27:01 AM This report has been signed electronically. Number of Addenda: 0

## 2023-12-16 ENCOUNTER — Encounter (INDEPENDENT_AMBULATORY_CARE_PROVIDER_SITE_OTHER): Payer: Self-pay | Admitting: *Deleted

## 2023-12-16 ENCOUNTER — Encounter (HOSPITAL_COMMUNITY): Payer: Self-pay | Admitting: Gastroenterology

## 2023-12-16 LAB — SURGICAL PATHOLOGY

## 2023-12-16 NOTE — Anesthesia Postprocedure Evaluation (Signed)
 Anesthesia Post Note  Patient: Jenna Hernandez  Procedure(s) Performed: COLONOSCOPY WITH PROPOFOL POLYPECTOMY  Patient location during evaluation: Phase II Anesthesia Type: General Level of consciousness: awake Pain management: pain level controlled Vital Signs Assessment: post-procedure vital signs reviewed and stable Respiratory status: spontaneous breathing and respiratory function stable Cardiovascular status: blood pressure returned to baseline and stable Postop Assessment: no headache and no apparent nausea or vomiting Anesthetic complications: no Comments: Late entry   No notable events documented.   Last Vitals:  Vitals:   12/13/23 0841 12/13/23 1023  BP: 137/77 (!) 90/48  Pulse: 80 91  Resp: 14 17  Temp: 37.1 C (!) 36.4 C  SpO2: 98% 96%    Last Pain:  Vitals:   12/13/23 1023  TempSrc: Oral  PainSc: 0-No pain                 Windell Norfolk

## 2023-12-19 ENCOUNTER — Encounter (INDEPENDENT_AMBULATORY_CARE_PROVIDER_SITE_OTHER): Payer: Self-pay | Admitting: *Deleted

## 2023-12-27 DIAGNOSIS — R059 Cough, unspecified: Secondary | ICD-10-CM | POA: Diagnosis not present

## 2023-12-27 DIAGNOSIS — R051 Acute cough: Secondary | ICD-10-CM | POA: Diagnosis not present

## 2023-12-27 DIAGNOSIS — J301 Allergic rhinitis due to pollen: Secondary | ICD-10-CM | POA: Diagnosis not present

## 2024-01-08 ENCOUNTER — Encounter: Payer: Self-pay | Admitting: Cardiology

## 2024-01-08 ENCOUNTER — Ambulatory Visit: Payer: Medicare Other | Attending: Cardiology | Admitting: Cardiology

## 2024-01-17 DIAGNOSIS — N1831 Chronic kidney disease, stage 3a: Secondary | ICD-10-CM | POA: Diagnosis not present

## 2024-01-17 DIAGNOSIS — I10 Essential (primary) hypertension: Secondary | ICD-10-CM | POA: Diagnosis not present

## 2024-01-17 DIAGNOSIS — E1165 Type 2 diabetes mellitus with hyperglycemia: Secondary | ICD-10-CM | POA: Diagnosis not present

## 2024-01-17 DIAGNOSIS — E7849 Other hyperlipidemia: Secondary | ICD-10-CM | POA: Diagnosis not present

## 2024-01-23 DIAGNOSIS — N1831 Chronic kidney disease, stage 3a: Secondary | ICD-10-CM | POA: Diagnosis not present

## 2024-01-23 DIAGNOSIS — M797 Fibromyalgia: Secondary | ICD-10-CM | POA: Diagnosis not present

## 2024-01-23 DIAGNOSIS — I10 Essential (primary) hypertension: Secondary | ICD-10-CM | POA: Diagnosis not present

## 2024-03-05 ENCOUNTER — Ambulatory Visit: Attending: Nurse Practitioner | Admitting: Nurse Practitioner

## 2024-03-05 ENCOUNTER — Encounter: Payer: Self-pay | Admitting: Nurse Practitioner

## 2024-03-05 ENCOUNTER — Other Ambulatory Visit (INDEPENDENT_AMBULATORY_CARE_PROVIDER_SITE_OTHER): Payer: Self-pay

## 2024-03-05 VITALS — BP 150/90 | HR 82 | Ht 63.0 in | Wt 185.0 lb

## 2024-03-05 DIAGNOSIS — I201 Angina pectoris with documented spasm: Secondary | ICD-10-CM

## 2024-03-05 DIAGNOSIS — I10 Essential (primary) hypertension: Secondary | ICD-10-CM | POA: Diagnosis not present

## 2024-03-05 DIAGNOSIS — R0981 Nasal congestion: Secondary | ICD-10-CM | POA: Diagnosis not present

## 2024-03-05 DIAGNOSIS — I25119 Atherosclerotic heart disease of native coronary artery with unspecified angina pectoris: Secondary | ICD-10-CM

## 2024-03-05 DIAGNOSIS — R051 Acute cough: Secondary | ICD-10-CM

## 2024-03-05 DIAGNOSIS — I2510 Atherosclerotic heart disease of native coronary artery without angina pectoris: Secondary | ICD-10-CM

## 2024-03-05 DIAGNOSIS — K219 Gastro-esophageal reflux disease without esophagitis: Secondary | ICD-10-CM

## 2024-03-05 DIAGNOSIS — E782 Mixed hyperlipidemia: Secondary | ICD-10-CM | POA: Diagnosis not present

## 2024-03-05 MED ORDER — OMEPRAZOLE 40 MG PO CPDR: 40.0000 mg | DELAYED_RELEASE_CAPSULE | Freq: Two times a day (BID) | ORAL | 1 refills | Status: DC

## 2024-03-05 NOTE — Patient Instructions (Addendum)

## 2024-03-05 NOTE — Progress Notes (Signed)
 Cardiology Office Note:  .   Date:  03/05/2024 ID:  Jenna Hernandez, DOB 02/25/1951, MRN [id number] PCP: Jenna Bio, MD  [facility] Providers Cardiologist:  Teddie Favre, MD    History of Present Illness: .   Jenna Hernandez is a 73 y.o. 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  Last seen by Dr. Londa Rival on April 09, 2022.  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  Today she presents for overdue 1 year follow-up.  She states she currently has been dealing with cough and congestion and wonders if she has a sinus infection/upper respiratory infection. Denies any chest pain, shortness of breath, palpitations, syncope, presyncope, dizziness, orthopnea, PND, swelling or significant weight changes, acute bleeding, or claudication.  ROS: Negative.  See HPI.  Studies Reviewed: Aaron Aas    EKG: EKG Interpretation Date/Time:  Thursday Mar 05 2024 10:10:46 EDT Ventricular Rate:  82 PR Interval:  138 QRS Duration:  74 QT Interval:  368 QTC Calculation: 429 R Axis:   8  Text Interpretation: Sinus rhythm with occasional Premature ventricular complexes Possible Inferior infarct , age undetermined Anterior infarct , age undetermined When compared with ECG of 28-Mar-2021 23:03, PREVIOUS ECG IS PRESENT Confirmed by Lasalle Pointer [phone]) on 03/05/2024 10:35:32 AM   Echo 03/2021: 1. Left ventricular ejection fraction, by estimation, is 60 to 65%. The  left ventricle has normal function. The left ventricle has no regional  wall motion abnormalities. Left ventricular diastolic parameters were  normal.   2. Right ventricular systolic function is normal. The right ventricular  size is normal.   3. Left atrial size was moderately dilated.   4. The pericardial effusion is posterior to the left  ventricle and  anterior to the right ventricle.   5. The mitral valve is normal in structure. Trivial mitral valve  regurgitation. No evidence of mitral stenosis.   6. The aortic valve is tricuspid. Aortic valve regurgitation is not  visualized. No aortic stenosis is present.   7. The inferior vena cava is normal in size with greater than 50%  respiratory variability, suggesting right atrial pressure of 3 mmHg.  L[facility] 06/2015:  The left ventricular systolic function is normal. No significant CAD.   Continue preventative therapy. Inpatient team to decide whether further testing is needed.     Physical Exam:   VS:  BP (!) 150/90   Pulse 82   Ht 5\' 3"  (1.6 m)   Wt 185 lb (83.9 kg)   SpO2 97%   BMI 32.77 kg/m    Wt Readings from Last 3 Encounters:  03/05/24 185 lb (83.9 kg)  12/13/23 181 lb (82.1 kg)  04/01/23 186 lb (84.4 kg)    GEN: Well nourished, well developed in no acute distress NECK: No JVD; No carotid bruits CARDIAC: S1/S2, RRR, no murmurs, rubs, gallops RESPIRATORY:  Clear and diminished to auscultation without rales, wheezing or rhonchi, strong, nonproductive cough ABDOMEN: Soft, non-tender, non-distended EXTREMITIES:  No edema; No deformity   ASSESSMENT AND PLAN: .    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 No indication for ischemic evaluation.  Continue aspirin , amlodipine , Imdur , losartan , pravastatin , and nitroglycerin  as needed. Heart healthy diet and regular cardiovascular exercise encouraged.   HTN BP elevated d/t recent illness. BP well controlled normally at  home. Discussed SBP goal < 140. Discussed to monitor BP at home at least 2 hours after medications and sitting for 5-10 minutes.  She will notify us  if her SBP is consistently greater than 140.  No medication changes at this time. Heart healthy diet and regular cardiovascular exercise encouraged. Continue to follow with PCP.  Mixed HLD Most recent labs show  LDL of 81. Continue pravastatin . Heart healthy diet and regular cardiovascular exercise encouraged.   4. Acute cough, nasal congestion Most likely due to sinus infection/URI.  Recommended follow-up with PCP for further evaluation and treatment.  She verbalized understanding.  No indication for chest x-ray to be done at this time.  Dispo: Follow-up with Dr. Londa Rival or APP in 1 year or sooner if any changes.  Signed, Lasalle Pointer, NP

## 2024-04-01 ENCOUNTER — Encounter: Payer: Self-pay | Admitting: Adult Health

## 2024-04-01 ENCOUNTER — Ambulatory Visit: Admitting: Adult Health

## 2024-04-01 VITALS — BP 125/85 | HR 88 | Ht 63.0 in | Wt 185.0 lb

## 2024-04-01 DIAGNOSIS — Z90710 Acquired absence of both cervix and uterus: Secondary | ICD-10-CM

## 2024-04-01 DIAGNOSIS — Z1211 Encounter for screening for malignant neoplasm of colon: Secondary | ICD-10-CM

## 2024-04-01 DIAGNOSIS — N3946 Mixed incontinence: Secondary | ICD-10-CM

## 2024-04-01 DIAGNOSIS — B369 Superficial mycosis, unspecified: Secondary | ICD-10-CM

## 2024-04-01 DIAGNOSIS — Z1331 Encounter for screening for depression: Secondary | ICD-10-CM

## 2024-04-01 DIAGNOSIS — Z01419 Encounter for gynecological examination (general) (routine) without abnormal findings: Secondary | ICD-10-CM

## 2024-04-01 LAB — HEMOCCULT GUIAC POC 1CARD (OFFICE): Fecal Occult Blood, POC: NEGATIVE

## 2024-04-01 MED ORDER — NYSTATIN 100000 UNIT/GM EX POWD: 1.0000 | Freq: Two times a day (BID) | CUTANEOUS | 3 refills | Status: AC

## 2024-04-01 NOTE — Progress Notes (Signed)
 Patient ID: Jenna Hernandez, female   DOB: 1951/05/19, 73 y.o.   MRN: [id number] History of Present Illness: Jenna Hernandez is a 73 year old white female, divorced,sp hysterectomy in for a well woman gyn exam. 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   PCP is Dr Atilano    Current Medications, Allergies, Past Medical History, Past Surgical History, Family History and Social History were reviewed in [employer] electronic medical record.     Review of Systems:  Patient denies any headaches, hearing loss, fatigue, blurred vision, shortness of breath, chest pain, abdominal pain, problems with bowel movements, urination(has some UI, wears a pad), or intercourse(not active). No joint pain or mood swings.  Gets itchy and irritated in pantie line   Physical Exam:BP 125/85 (BP Location: Right Arm, Patient Position: Sitting, Cuff Size: Normal)   Pulse 88   Ht 5' 3 (1.6 m)   Wt 185 lb (83.9 kg)   BMI 32.77 kg/m   General:  Well developed, well nourished, no acute distress Skin:  Warm and dry Neck:  Midline trachea, normal thyroid , good ROM, no lymphadenopathy,no carotid bruits heard  Lungs; Clear to auscultation bilaterally Breast:  No dominant palpable mass, retraction, or nipple discharge Cardiovascular: Regular rate and rhythm Abdomen:  Soft, non tender, no hepatosplenomegaly Pelvic:  External genitalia is normal in appearance, no lesions.  The vagina is pale. Urethra has no lesions or masses. The cervix and uterus is absent.  No adnexal masses or tenderness noted.Bladder is non tender, no masses felt. Rectal: Good sphincter tone, no polyps, or hemorrhoids felt.  Hemoccult negative. Extremities/musculoskeletal:  No swelling or varicosities noted, no clubbing or cyanosis Psych:  No mood changes, alert and cooperative,seems happy AA is 0 Fall risk is low    04/01/2024    1:40 PM 04/01/2023   10:28 AM 03/29/2022    9:54 AM   Depression screen PHQ 2/9  Decreased Interest 0 1 0  Down, Depressed, Hopeless 1 1 1   PHQ - 2 Score 1 2 1   Altered sleeping 1 0 0  Tired, decreased energy 1 1 1   Change in appetite 1 1 1   Feeling bad or failure about yourself  1 1 1   Trouble concentrating 0 0 0  Moving slowly or fidgety/restless 0 0 0  Suicidal thoughts 0 0 0  PHQ-9 Score 5 5 4        04/01/2024    1:40 PM 04/01/2023   10:28 AM 03/29/2022    9:54 AM 03/28/2021   10:45 AM  GAD 7 : Generalized Anxiety Score  Nervous, Anxious, on Edge 0 0 1 0  Control/stop worrying 0 0 1 0  Worry too much - different things 0 0 1 1  Trouble relaxing 0 0 0 0  Restless 0 0 0 1  Easily annoyed or irritable 0 0 1 1  Afraid - awful might happen 0 0 0 0  Total GAD 7 Score 0 0 4 3      Upstream - 04/01/24 1338       Pregnancy Intention Screening   Does the patient want to become pregnant in the next year? N/A    Does the patient's partner want to become pregnant in the next year? N/A    Would the patient like to discuss contraceptive options today? N/A      Contraception Wrap Up   Current Method Female Sterilization   hyst  End Method Female Sterilization   hyst   Contraception Counseling Provided No          Examination chaperoned by Clarita Salt LPN  Impression and plan: 1. Encounter for well woman exam with routine gynecological exam (Primary) Physical in 1 yaer Labs with PCP Mammogram was negative 05/21/23  Colonoscopy in 3 years, had tubular adenomas   2. S/P hysterectomy  3. Encounter for screening fecal occult blood testing Hemoccult was negative  - POCT occult blood stool  4. 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 Superficial fungus infection of skin Get irritated in pantie line, will rx nystatin powders    Meds ordered this encounter  Medications   nystatin (MYCOSTATIN/NYSTOP) powder    Sig: Apply 1 Application topically 2 (two) times daily.    Dispense:  60 g     Refill:  3    Supervising Provider:   JAYNE MINDER H [[id number]]

## 2024-04-06 ENCOUNTER — Other Ambulatory Visit: Payer: Self-pay | Admitting: Cardiology

## 2024-04-21 DIAGNOSIS — M1612 Unilateral primary osteoarthritis, left hip: Secondary | ICD-10-CM | POA: Diagnosis not present

## 2024-04-22 DIAGNOSIS — M1612 Unilateral primary osteoarthritis, left hip: Secondary | ICD-10-CM | POA: Diagnosis not present

## 2024-05-05 ENCOUNTER — Other Ambulatory Visit: Payer: Self-pay | Admitting: Cardiology

## 2024-05-20 ENCOUNTER — Encounter: Payer: Self-pay | Admitting: Adult Health

## 2024-05-20 ENCOUNTER — Telehealth: Payer: Self-pay | Admitting: Adult Health

## 2024-05-20 DIAGNOSIS — Z780 Asymptomatic menopausal state: Secondary | ICD-10-CM | POA: Insufficient documentation

## 2024-05-20 DIAGNOSIS — M8580 Other specified disorders of bone density and structure, unspecified site: Secondary | ICD-10-CM | POA: Insufficient documentation

## 2024-05-20 NOTE — Telephone Encounter (Signed)
 Called pt to let her know I got copy of hip xray

## 2024-05-27 DIAGNOSIS — Z1231 Encounter for screening mammogram for malignant neoplasm of breast: Secondary | ICD-10-CM | POA: Diagnosis not present

## 2024-07-20 DIAGNOSIS — J019 Acute sinusitis, unspecified: Secondary | ICD-10-CM | POA: Diagnosis not present

## 2024-07-22 ENCOUNTER — Encounter (INDEPENDENT_AMBULATORY_CARE_PROVIDER_SITE_OTHER): Payer: Self-pay | Admitting: Gastroenterology

## 2024-07-22 DIAGNOSIS — N1831 Chronic kidney disease, stage 3a: Secondary | ICD-10-CM | POA: Diagnosis not present

## 2024-07-22 DIAGNOSIS — E1165 Type 2 diabetes mellitus with hyperglycemia: Secondary | ICD-10-CM | POA: Diagnosis not present

## 2024-07-22 DIAGNOSIS — E7849 Other hyperlipidemia: Secondary | ICD-10-CM | POA: Diagnosis not present

## 2024-07-31 DIAGNOSIS — M797 Fibromyalgia: Secondary | ICD-10-CM | POA: Diagnosis not present

## 2024-07-31 DIAGNOSIS — F5101 Primary insomnia: Secondary | ICD-10-CM | POA: Diagnosis not present

## 2024-07-31 DIAGNOSIS — Z23 Encounter for immunization: Secondary | ICD-10-CM | POA: Diagnosis not present

## 2024-07-31 DIAGNOSIS — K219 Gastro-esophageal reflux disease without esophagitis: Secondary | ICD-10-CM | POA: Diagnosis not present

## 2024-07-31 DIAGNOSIS — R7303 Prediabetes: Secondary | ICD-10-CM | POA: Diagnosis not present

## 2024-07-31 DIAGNOSIS — I10 Essential (primary) hypertension: Secondary | ICD-10-CM | POA: Diagnosis not present

## 2024-08-12 NOTE — Progress Notes (Signed)
 Jenna Hernandez                                          MRN: [id number]   08/12/2024   The [facility] Quality Team Specialist reviewed this patient medical record for the purposes of chart review for care gap closure. The following were reviewed: chart review for care gap closure-kidney health evaluation for diabetes:eGFR  and uACR.    [facility] Quality Team

## 2024-08-31 ENCOUNTER — Other Ambulatory Visit: Payer: Self-pay | Admitting: Adult Health

## 2024-09-17 ENCOUNTER — Telehealth (INDEPENDENT_AMBULATORY_CARE_PROVIDER_SITE_OTHER): Payer: Self-pay

## 2024-09-17 DIAGNOSIS — K219 Gastro-esophageal reflux disease without esophagitis: Secondary | ICD-10-CM

## 2024-09-17 MED ORDER — OMEPRAZOLE 40 MG PO CPDR: 40.0000 mg | DELAYED_RELEASE_CAPSULE | Freq: Two times a day (BID) | ORAL | 0 refills | Status: DC

## 2024-09-17 NOTE — Telephone Encounter (Signed)
 Jenna Hernandez asking for refills on Omeprazole  40 mg once per day. Patient last seen in the office on 12/14/2022, Had a TCS with Dr. Eartha, on 12/13/2023. No future appointments are scheduled. We will give enough to last patient until she calls for an appointment.

## 2024-10-16 ENCOUNTER — Other Ambulatory Visit (INDEPENDENT_AMBULATORY_CARE_PROVIDER_SITE_OTHER): Payer: Self-pay

## 2024-10-16 ENCOUNTER — Telehealth (INDEPENDENT_AMBULATORY_CARE_PROVIDER_SITE_OTHER): Payer: Self-pay

## 2024-10-16 DIAGNOSIS — K219 Gastro-esophageal reflux disease without esophagitis: Secondary | ICD-10-CM

## 2024-10-16 MED ORDER — OMEPRAZOLE 40 MG PO CPDR: 40.0000 mg | DELAYED_RELEASE_CAPSULE | Freq: Two times a day (BID) | ORAL | 0 refills | Status: AC

## 2024-10-16 NOTE — Telephone Encounter (Signed)
 Patient called in today asking for refills on her script of omeprazole  bid. I advised I would send in a month supply,but do to the patient not being seen since her last procdure in March of 2024 she would need an office visit. I transferred her to the front office for an appointment with a provider to establish care and to get further refills.

## 2024-11-10 ENCOUNTER — Other Ambulatory Visit (INDEPENDENT_AMBULATORY_CARE_PROVIDER_SITE_OTHER): Payer: Self-pay | Admitting: Gastroenterology

## 2024-11-10 DIAGNOSIS — K219 Gastro-esophageal reflux disease without esophagitis: Secondary | ICD-10-CM
